# Patient Record
Sex: Male | Born: 1935 | Race: White | Hispanic: No | Marital: Married | Smoking: Never smoker
Health system: Southern US, Community
[De-identification: ages and names within clinical notes are randomized; demographics above are authoritative.]

## PROBLEM LIST (undated history)

## (undated) DIAGNOSIS — R011 Cardiac murmur, unspecified: Secondary | ICD-10-CM

## (undated) DIAGNOSIS — N529 Male erectile dysfunction, unspecified: Secondary | ICD-10-CM

## (undated) DIAGNOSIS — R32 Unspecified urinary incontinence: Secondary | ICD-10-CM

## (undated) DIAGNOSIS — Z87448 Personal history of other diseases of urinary system: Secondary | ICD-10-CM

## (undated) DIAGNOSIS — Z973 Presence of spectacles and contact lenses: Secondary | ICD-10-CM

## (undated) DIAGNOSIS — M1990 Unspecified osteoarthritis, unspecified site: Secondary | ICD-10-CM

## (undated) DIAGNOSIS — Z87442 Personal history of urinary calculi: Secondary | ICD-10-CM

## (undated) DIAGNOSIS — M25512 Pain in left shoulder: Secondary | ICD-10-CM

## (undated) DIAGNOSIS — Z8679 Personal history of other diseases of the circulatory system: Secondary | ICD-10-CM

## (undated) DIAGNOSIS — I10 Essential (primary) hypertension: Secondary | ICD-10-CM

## (undated) DIAGNOSIS — I4510 Unspecified right bundle-branch block: Secondary | ICD-10-CM

## (undated) DIAGNOSIS — C61 Malignant neoplasm of prostate: Secondary | ICD-10-CM

## (undated) DIAGNOSIS — E039 Hypothyroidism, unspecified: Secondary | ICD-10-CM

## (undated) DIAGNOSIS — J45909 Unspecified asthma, uncomplicated: Secondary | ICD-10-CM

## (undated) DIAGNOSIS — G8929 Other chronic pain: Secondary | ICD-10-CM

## (undated) DIAGNOSIS — N1330 Unspecified hydronephrosis: Secondary | ICD-10-CM

## (undated) DIAGNOSIS — J309 Allergic rhinitis, unspecified: Secondary | ICD-10-CM

## (undated) HISTORY — PX: TONSILLECTOMY: SUR1361

## (undated) HISTORY — PX: FRACTURE SURGERY: SHX138

## (undated) HISTORY — PX: EXTRACORPOREAL SHOCK WAVE LITHOTRIPSY: SHX1557

## (undated) HISTORY — PX: OTHER SURGICAL HISTORY: SHX169

---

## 1995-09-26 HISTORY — PX: OTHER SURGICAL HISTORY: SHX169

## 1998-03-22 ENCOUNTER — Ambulatory Visit (HOSPITAL_COMMUNITY): Admission: RE | Admit: 1998-03-22 | Discharge: 1998-03-22 | Payer: Self-pay | Admitting: Urology

## 1998-07-19 ENCOUNTER — Encounter: Admission: RE | Admit: 1998-07-19 | Discharge: 1998-10-17 | Payer: Self-pay | Admitting: Family Medicine

## 1998-11-05 ENCOUNTER — Encounter: Payer: Self-pay | Admitting: Family Medicine

## 1998-11-05 ENCOUNTER — Ambulatory Visit (HOSPITAL_COMMUNITY): Admission: RE | Admit: 1998-11-05 | Discharge: 1998-11-05 | Payer: Self-pay | Admitting: Family Medicine

## 1998-12-03 ENCOUNTER — Encounter: Payer: Self-pay | Admitting: General Surgery

## 1998-12-03 ENCOUNTER — Ambulatory Visit (HOSPITAL_COMMUNITY): Admission: RE | Admit: 1998-12-03 | Discharge: 1998-12-03 | Payer: Self-pay | Admitting: General Surgery

## 1998-12-10 ENCOUNTER — Encounter: Payer: Self-pay | Admitting: General Surgery

## 1998-12-16 ENCOUNTER — Ambulatory Visit (HOSPITAL_COMMUNITY): Admission: RE | Admit: 1998-12-16 | Discharge: 1998-12-16 | Payer: Self-pay | Admitting: General Surgery

## 2001-08-07 ENCOUNTER — Ambulatory Visit (HOSPITAL_COMMUNITY): Admission: RE | Admit: 2001-08-07 | Discharge: 2001-08-07 | Payer: Self-pay | Admitting: Gastroenterology

## 2001-09-11 ENCOUNTER — Encounter: Admission: RE | Admit: 2001-09-11 | Discharge: 2001-09-11 | Payer: Self-pay | Admitting: Urology

## 2001-09-11 ENCOUNTER — Encounter: Payer: Self-pay | Admitting: Urology

## 2002-01-15 ENCOUNTER — Inpatient Hospital Stay (HOSPITAL_COMMUNITY): Admission: EM | Admit: 2002-01-15 | Discharge: 2002-01-16 | Payer: Self-pay | Admitting: Emergency Medicine

## 2002-01-15 ENCOUNTER — Encounter: Payer: Self-pay | Admitting: Orthopedic Surgery

## 2002-01-15 ENCOUNTER — Encounter: Payer: Self-pay | Admitting: Emergency Medicine

## 2002-01-15 HISTORY — PX: OTHER SURGICAL HISTORY: SHX169

## 2003-09-03 ENCOUNTER — Ambulatory Visit (HOSPITAL_COMMUNITY): Admission: RE | Admit: 2003-09-03 | Discharge: 2003-09-03 | Payer: Self-pay | Admitting: Urology

## 2004-12-14 ENCOUNTER — Encounter: Admission: RE | Admit: 2004-12-14 | Discharge: 2004-12-14 | Payer: Self-pay | Admitting: Urology

## 2008-05-07 ENCOUNTER — Ambulatory Visit (HOSPITAL_COMMUNITY): Admission: RE | Admit: 2008-05-07 | Discharge: 2008-05-07 | Payer: Self-pay | Admitting: Urology

## 2009-04-29 ENCOUNTER — Encounter: Admission: RE | Admit: 2009-04-29 | Discharge: 2009-04-29 | Payer: Self-pay | Admitting: Family Medicine

## 2009-05-17 ENCOUNTER — Ambulatory Visit (HOSPITAL_COMMUNITY): Admission: RE | Admit: 2009-05-17 | Discharge: 2009-05-17 | Payer: Self-pay | Admitting: Urology

## 2009-05-21 ENCOUNTER — Ambulatory Visit (HOSPITAL_COMMUNITY): Admission: RE | Admit: 2009-05-21 | Discharge: 2009-05-21 | Payer: Self-pay | Admitting: Urology

## 2009-06-01 ENCOUNTER — Encounter: Admission: RE | Admit: 2009-06-01 | Discharge: 2009-06-01 | Payer: Self-pay | Admitting: Urology

## 2009-06-29 ENCOUNTER — Inpatient Hospital Stay (HOSPITAL_COMMUNITY): Admission: RE | Admit: 2009-06-29 | Discharge: 2009-07-02 | Payer: Self-pay | Admitting: Orthopaedic Surgery

## 2009-06-29 HISTORY — PX: TOTAL HIP ARTHROPLASTY: SHX124

## 2010-05-25 ENCOUNTER — Ambulatory Visit (HOSPITAL_COMMUNITY): Admission: RE | Admit: 2010-05-25 | Discharge: 2010-05-25 | Payer: Self-pay | Admitting: Urology

## 2010-10-17 ENCOUNTER — Encounter: Payer: Self-pay | Admitting: Urology

## 2010-12-29 LAB — POCT I-STAT 4, (NA,K, GLUC, HGB,HCT)
Glucose, Bld: 128 mg/dL — ABNORMAL HIGH (ref 70–99)
HCT: 33 % — ABNORMAL LOW (ref 39.0–52.0)

## 2010-12-29 LAB — CBC
HCT: 38.2 % — ABNORMAL LOW (ref 39.0–52.0)
Hemoglobin: 13.1 g/dL (ref 13.0–17.0)
Hemoglobin: 8.6 g/dL — ABNORMAL LOW (ref 13.0–17.0)
MCV: 96.4 fL (ref 78.0–100.0)
MCV: 96.5 fL (ref 78.0–100.0)
Platelets: 166 10*3/uL (ref 150–400)
RBC: 2.58 MIL/uL — ABNORMAL LOW (ref 4.22–5.81)
RBC: 2.59 MIL/uL — ABNORMAL LOW (ref 4.22–5.81)
RDW: 13.5 % (ref 11.5–15.5)
RDW: 13.7 % (ref 11.5–15.5)
WBC: 6.3 10*3/uL (ref 4.0–10.5)
WBC: 6.5 10*3/uL (ref 4.0–10.5)
WBC: 6.6 10*3/uL (ref 4.0–10.5)

## 2010-12-29 LAB — BASIC METABOLIC PANEL
BUN: 20 mg/dL (ref 6–23)
Calcium: 8.9 mg/dL (ref 8.4–10.5)
Chloride: 101 mEq/L (ref 96–112)
Chloride: 106 mEq/L (ref 96–112)
Creatinine, Ser: 0.71 mg/dL (ref 0.4–1.5)
Creatinine, Ser: 1 mg/dL (ref 0.4–1.5)
GFR calc Af Amer: >60 mL/min (ref >60)
GFR calc Af Amer: >60 mL/min (ref >60)
GFR calc Af Amer: >60 mL/min (ref >60)
GFR calc non Af Amer: >60 mL/min (ref >60)
GFR calc non Af Amer: >60 mL/min (ref >60)
GFR calc non Af Amer: >60 mL/min (ref >60)
Glucose, Bld: 117 mg/dL — ABNORMAL HIGH (ref 70–99)
Potassium: 4.1 mEq/L (ref 3.5–5.1)
Potassium: 4.3 mEq/L (ref 3.5–5.1)
Potassium: 4.4 mEq/L (ref 3.5–5.1)
Sodium: 136 mEq/L (ref 135–145)
Sodium: 140 mEq/L (ref 135–145)

## 2010-12-29 LAB — PROTIME-INR
INR: 1.05 (ref 0.00–1.49)
INR: 2.18 — ABNORMAL HIGH (ref 0.00–1.49)
Prothrombin Time: 13.6 seconds (ref 11.6–15.2)
Prothrombin Time: 15 seconds (ref 11.6–15.2)
Prothrombin Time: 22.2 seconds — ABNORMAL HIGH (ref 11.6–15.2)

## 2011-02-10 NOTE — Procedures (Signed)
[facility]  Patient:    ROBERTS, BON Visit Number: [id number] MRN: [id number]          Service Type: END Location: ENDO Attending Physician:  Orland Mustard Dictated by:   Llana Aliment. Randa Evens, M.D. Proc. Date: 08/07/01 Admit Date:  08/07/2001   CC:         Stacie Acres. Cliffton Asters, M.D.   Procedure Report  PROCEDURE:  Colonoscopy.  MEDICATIONS:  Fentanyl 62.5 mcg, Versed 5 mg IV.  SCOPE:  Adult Olympus video colonoscope.  INDICATION:  Strong family history of colon cancer.  Brother is dying of metastatic colon cancer.  DESCRIPTION OF PROCEDURE:  The procedure had been explained and consent obtained.  With the patient in the left lateral decubitus position, the Olympus adult video colonoscope was inserted and advanced under direct visualization.  The prep was excellent.  We were able to reach the cecum without difficulty.  The ascending colon, hepatic flexure, transverse colon, splenic flexure, descending, and sigmoid colon were seen well upon removal. No polyps throughout the entire colon.  Moderate diverticular disease seen in the sigmoid colon.  The scope was withdrawn.  The patient tolerated the procedure well.  ASSESSMENT: 1. Moderate diverticulosis of the sigmoid colon. 2. No evidence of polyps in a gentleman with a strong family history of colon    cancer.  PLAN: 1. Will recommend repeating in five years due to his family history. 2. Will give information about diverticular disease. Dictated by:   Llana Aliment. Randa Evens, M.D. Attending Physician:  Orland Mustard DD:  08/07/01 TD:  08/07/01 Job: [id number]

## 2011-02-10 NOTE — Op Note (Signed)
[place]  Patient:    [name] [name], [name] [name] Visit Number: [id number] MRN: [id number]          Service Type: MED Location: 1800 [id number] [id number] Attending Physician:  Nelia Shi Dictated by:   Elana Alm Thurston Hole, M.D. Proc. Date: 01/15/02 Admit Date:  01/15/2002                             Operative Report  PREOPERATIVE DIAGNOSIS:  Left ankle fracture dislocation.  POSTOPERATIVE DIAGNOSIS:  Left ankle fracture dislocation.  PROCEDURE:  Open reduction and internal fixation of left ankle fracture dislocation.  SURGEON:  Elana Alm. Thurston Hole, M.D.  ASSISTANT:  Julien Girt, P.A.  ANESTHESIA:  General.  OPERATIVE TIME:  One hour.  COMPLICATIONS:  None.  DESCRIPTION OF PROCEDURE:  Mr. Kyser was brought to the operating room on January 15, 2002 and placed on the operative table in supine position.  After an adequate level of general anesthesia was obtained, his left foot and leg were prepped using sterile Betadine and draped using sterile technique.  The initial dislocation had been reduced in the emergency room but he had an obvious persistent fracture with instability of the ankle.  He received Ancef 1 g IV preoperatively for prophylaxis.  The left leg was prepped and draped sterilely and then the leg was exsanguinated and the calf tourniquet elevated to 300 mm.  Initially, through a 7- to 8-cm longitudinal distal fibular incision, initial exposure was made.  The underlying subcutaneous tissues were incised in line with the skin incision.  The distal fibular fracture was exposed longitudinally, the peroneal tendons and sural nerve carefully retracted posteriorly and protected.  Initially, the fracture was reduced in an anatomic position and then a 3.5-mm anterior-to-posterior lag screw was placed across the fibula fracture site, holding provisional reduction.  A six-hole one-third tubulbar plate was then placed on the posterolateral surface,  with the most distal screw hole drilled, measured, tapped and the appropriate-length 4.0-mm screw placed and the three most proximal screw holes drilled, measured, tapped and the appropriate-length 3.5-mm cortical screws placed.  After this was done, then a syndesmosis screw, 4.0 mm in diameter and 40 mm in length, was placed through the fibular plate across the syndesmosis and into the distal tibia, parallel to the joint line; this held the syndesmosis in a reduced and anatomic position.  After this was done, the hardware was found to be in excellent position; the syndesmosis and the mortise were found to be reduced as well in an anatomic position.  At this point, the wounds were irrigated, then closed using 2-0 Vicryl and skin staples.  Sterile dressings and a short leg splint were applied.  The tourniquet was released, the patient then awakened and taken to recovery room in stable condition.  Needle and sponge count was correct x2 at the end of the case. Dictated by:   Elana Alm Thurston Hole, M.D. Attending Physician:  Nelia Shi DD:  01/15/02 TD:  01/15/02 Job: [id number]

## 2011-02-23 ENCOUNTER — Ambulatory Visit (HOSPITAL_BASED_OUTPATIENT_CLINIC_OR_DEPARTMENT_OTHER)
Admission: RE | Admit: 2011-02-23 | Discharge: 2011-02-23 | Disposition: A | Discharge status: Home / Self Care | Payer: Medicare Other | Source: Ambulatory Visit | Attending: Urology | Admitting: Urology

## 2011-02-23 DIAGNOSIS — N201 Calculus of ureter: Secondary | ICD-10-CM | POA: Insufficient documentation

## 2011-02-23 DIAGNOSIS — R972 Elevated prostate specific antigen [PSA]: Secondary | ICD-10-CM | POA: Insufficient documentation

## 2011-02-23 DIAGNOSIS — Z01812 Encounter for preprocedural laboratory examination: Secondary | ICD-10-CM | POA: Insufficient documentation

## 2011-02-23 DIAGNOSIS — N2889 Other specified disorders of kidney and ureter: Secondary | ICD-10-CM | POA: Insufficient documentation

## 2011-02-23 DIAGNOSIS — N210 Calculus in bladder: Secondary | ICD-10-CM | POA: Insufficient documentation

## 2011-02-23 DIAGNOSIS — Z8546 Personal history of malignant neoplasm of prostate: Secondary | ICD-10-CM | POA: Insufficient documentation

## 2011-02-23 DIAGNOSIS — Z01810 Encounter for preprocedural cardiovascular examination: Secondary | ICD-10-CM | POA: Insufficient documentation

## 2011-02-23 HISTORY — PX: OTHER SURGICAL HISTORY: SHX169

## 2011-03-06 NOTE — Op Note (Signed)
  NAME:  OVAL, CAVAZOS NO.:  [id number]  MEDICAL RECORD NO.:  [id number]  LOCATION:                                 FACILITY:  PHYSICIAN:  Excell Seltzer. Annabell Howells, M.D.    DATE OF BIRTH:  02-26-1932  DATE OF PROCEDURE:  02/23/2011 DATE OF DISCHARGE:                              OPERATIVE REPORT   PATIENT OF:  Excell Seltzer. Annabell Howells, M.D.  PROCEDURE PERFORMED:  Cystoscopy, unroofing of right ureterocele and stone manipulation.  PREOPERATIVE DIAGNOSIS:  Bladder stone.  POSTOPERATIVE DIAGNOSIS:  Right ureterocele with ureteral stone.  SURGEON:  Excell Seltzer. Annabell Howells, M.D.  ANESTHESIA:  General.  SPECIMENS:  Stone.  DRAINS:  None.  COMPLICATIONS:  None.  INDICATIONS:  Mr. Louvier is a 77 year old white male with a history of prostate cancer treated with radiation therapy with a rising PSA, who was recently evaluated for occasional urgency and urge incontinence with prior history of stones.  He had a plain film in the office, which revealed what appeared to be a bladder stone and this was confirmed with ultrasound.  FINDINGS OF PROCEDURES:  He was given Cipro.  He was taken to the operating room.  He was fitted with PAS hose.  A general anesthetic was induced.  He was placed in lithotomy position.  His perineum and genitalia were prepped with Betadine solution and he was draped in the usual sterile fashion.  Cystoscopy was performed using a 22-French scope and 12- and 70-degree lenses.  Examination revealed a normal urethra. The external sphincter was intact.  The prostatic urethra short with blanched mucosa and rather fixed mobility related to his prior radiation.  Examination of bladder revealed mild-to-moderate trabeculation.  No tumors were seen.  The left ureteral orifice was unremarkable.  The right ureteral orifice had the stone crowning from the orifice and a mounted appearance consistent with ureterocele.  At this point, urethra was calibrated.  A 28-French and a  27-French resectoscopes were inserted.  This was fitted with an Wandra Scot handle with [employer] and a 12-degree lens, and the ureterocele was unroofed allowing removal of the stone with the tip of the [employer].  The stone was then removed through with the resectoscope sheath.  Reinspection of bladder revealed no significant bleeding.  The bladder was drained.  The patient was taken down from lithotomy position.  His anesthetic was reversed.  He was moved to the recovery room in stable condition.  There were no complications.     Excell Seltzer. Annabell Howells, M.D.     JJW/MEDQ  D:  02/23/2011  T:  02/23/2011  Job:  [id number]  cc:   Molly Maduro A. Nicholos Johns, M.D. Fax: [phone]  Lubertha Basque. Jerl Santos, M.D. Fax: [phone]  Electronically Signed by Bjorn Pippin M.D. on 03/06/2011 07:41:19 AM

## 2011-10-09 DIAGNOSIS — K5730 Diverticulosis of large intestine without perforation or abscess without bleeding: Secondary | ICD-10-CM | POA: Diagnosis not present

## 2011-10-09 DIAGNOSIS — K648 Other hemorrhoids: Secondary | ICD-10-CM | POA: Diagnosis not present

## 2011-10-09 DIAGNOSIS — Z1211 Encounter for screening for malignant neoplasm of colon: Secondary | ICD-10-CM | POA: Diagnosis not present

## 2011-10-09 DIAGNOSIS — Z800 Family history of malignant neoplasm of digestive organs: Secondary | ICD-10-CM | POA: Diagnosis not present

## 2011-10-30 DIAGNOSIS — C61 Malignant neoplasm of prostate: Secondary | ICD-10-CM | POA: Diagnosis not present

## 2011-11-06 DIAGNOSIS — C61 Malignant neoplasm of prostate: Secondary | ICD-10-CM | POA: Diagnosis not present

## 2011-11-06 DIAGNOSIS — N200 Calculus of kidney: Secondary | ICD-10-CM | POA: Diagnosis not present

## 2011-11-06 DIAGNOSIS — N3941 Urge incontinence: Secondary | ICD-10-CM | POA: Diagnosis not present

## 2011-12-14 DIAGNOSIS — M6281 Muscle weakness (generalized): Secondary | ICD-10-CM | POA: Diagnosis not present

## 2011-12-14 DIAGNOSIS — R279 Unspecified lack of coordination: Secondary | ICD-10-CM | POA: Diagnosis not present

## 2011-12-14 DIAGNOSIS — N3941 Urge incontinence: Secondary | ICD-10-CM | POA: Diagnosis not present

## 2011-12-14 DIAGNOSIS — N39498 Other specified urinary incontinence: Secondary | ICD-10-CM | POA: Diagnosis not present

## 2011-12-18 DIAGNOSIS — M6281 Muscle weakness (generalized): Secondary | ICD-10-CM | POA: Diagnosis not present

## 2011-12-18 DIAGNOSIS — N39498 Other specified urinary incontinence: Secondary | ICD-10-CM | POA: Diagnosis not present

## 2011-12-18 DIAGNOSIS — R3589 Other polyuria: Secondary | ICD-10-CM | POA: Diagnosis not present

## 2011-12-18 DIAGNOSIS — R358 Other polyuria: Secondary | ICD-10-CM | POA: Diagnosis not present

## 2011-12-18 DIAGNOSIS — R279 Unspecified lack of coordination: Secondary | ICD-10-CM | POA: Diagnosis not present

## 2011-12-19 DIAGNOSIS — H25019 Cortical age-related cataract, unspecified eye: Secondary | ICD-10-CM | POA: Diagnosis not present

## 2011-12-25 DIAGNOSIS — M6281 Muscle weakness (generalized): Secondary | ICD-10-CM | POA: Diagnosis not present

## 2011-12-25 DIAGNOSIS — N3941 Urge incontinence: Secondary | ICD-10-CM | POA: Diagnosis not present

## 2011-12-25 DIAGNOSIS — R279 Unspecified lack of coordination: Secondary | ICD-10-CM | POA: Diagnosis not present

## 2011-12-25 DIAGNOSIS — N39498 Other specified urinary incontinence: Secondary | ICD-10-CM | POA: Diagnosis not present

## 2012-01-03 DIAGNOSIS — M216X9 Other acquired deformities of unspecified foot: Secondary | ICD-10-CM | POA: Diagnosis not present

## 2012-01-03 DIAGNOSIS — M25519 Pain in unspecified shoulder: Secondary | ICD-10-CM | POA: Diagnosis not present

## 2012-01-03 DIAGNOSIS — Z23 Encounter for immunization: Secondary | ICD-10-CM | POA: Diagnosis not present

## 2012-01-03 DIAGNOSIS — J309 Allergic rhinitis, unspecified: Secondary | ICD-10-CM | POA: Diagnosis not present

## 2012-01-03 DIAGNOSIS — E039 Hypothyroidism, unspecified: Secondary | ICD-10-CM | POA: Diagnosis not present

## 2012-01-03 DIAGNOSIS — Z0000 Encounter for general adult medical examination without abnormal findings: Secondary | ICD-10-CM | POA: Diagnosis not present

## 2012-01-08 DIAGNOSIS — N39498 Other specified urinary incontinence: Secondary | ICD-10-CM | POA: Diagnosis not present

## 2012-01-08 DIAGNOSIS — R279 Unspecified lack of coordination: Secondary | ICD-10-CM | POA: Diagnosis not present

## 2012-01-08 DIAGNOSIS — M6281 Muscle weakness (generalized): Secondary | ICD-10-CM | POA: Diagnosis not present

## 2012-01-08 DIAGNOSIS — N3941 Urge incontinence: Secondary | ICD-10-CM | POA: Diagnosis not present

## 2012-01-22 ENCOUNTER — Ambulatory Visit: Payer: Medicare Other | Attending: Family Medicine | Admitting: Physical Therapy

## 2012-01-22 DIAGNOSIS — R269 Unspecified abnormalities of gait and mobility: Secondary | ICD-10-CM | POA: Insufficient documentation

## 2012-01-22 DIAGNOSIS — IMO0001 Reserved for inherently not codable concepts without codable children: Secondary | ICD-10-CM | POA: Diagnosis not present

## 2012-01-29 DIAGNOSIS — M6281 Muscle weakness (generalized): Secondary | ICD-10-CM | POA: Diagnosis not present

## 2012-01-29 DIAGNOSIS — N39498 Other specified urinary incontinence: Secondary | ICD-10-CM | POA: Diagnosis not present

## 2012-01-29 DIAGNOSIS — N3941 Urge incontinence: Secondary | ICD-10-CM | POA: Diagnosis not present

## 2012-01-29 DIAGNOSIS — R279 Unspecified lack of coordination: Secondary | ICD-10-CM | POA: Diagnosis not present

## 2012-01-30 ENCOUNTER — Ambulatory Visit: Payer: Medicare Other | Attending: Family Medicine | Admitting: Physical Therapy

## 2012-01-30 DIAGNOSIS — IMO0001 Reserved for inherently not codable concepts without codable children: Secondary | ICD-10-CM | POA: Diagnosis not present

## 2012-01-30 DIAGNOSIS — R269 Unspecified abnormalities of gait and mobility: Secondary | ICD-10-CM | POA: Insufficient documentation

## 2012-02-01 ENCOUNTER — Ambulatory Visit: Payer: Medicare Other | Admitting: Physical Therapy

## 2012-02-01 DIAGNOSIS — R269 Unspecified abnormalities of gait and mobility: Secondary | ICD-10-CM | POA: Diagnosis not present

## 2012-02-01 DIAGNOSIS — IMO0001 Reserved for inherently not codable concepts without codable children: Secondary | ICD-10-CM | POA: Diagnosis not present

## 2012-02-05 ENCOUNTER — Ambulatory Visit: Payer: Medicare Other | Admitting: Physical Therapy

## 2012-02-05 DIAGNOSIS — R269 Unspecified abnormalities of gait and mobility: Secondary | ICD-10-CM | POA: Diagnosis not present

## 2012-02-05 DIAGNOSIS — IMO0001 Reserved for inherently not codable concepts without codable children: Secondary | ICD-10-CM | POA: Diagnosis not present

## 2012-02-07 ENCOUNTER — Ambulatory Visit: Payer: Medicare Other | Admitting: Physical Therapy

## 2012-02-07 DIAGNOSIS — R269 Unspecified abnormalities of gait and mobility: Secondary | ICD-10-CM | POA: Diagnosis not present

## 2012-02-07 DIAGNOSIS — IMO0001 Reserved for inherently not codable concepts without codable children: Secondary | ICD-10-CM | POA: Diagnosis not present

## 2012-02-12 ENCOUNTER — Ambulatory Visit: Payer: Medicare Other | Admitting: Physical Therapy

## 2012-02-12 DIAGNOSIS — IMO0001 Reserved for inherently not codable concepts without codable children: Secondary | ICD-10-CM | POA: Diagnosis not present

## 2012-02-12 DIAGNOSIS — R269 Unspecified abnormalities of gait and mobility: Secondary | ICD-10-CM | POA: Diagnosis not present

## 2012-02-13 DIAGNOSIS — N3941 Urge incontinence: Secondary | ICD-10-CM | POA: Diagnosis not present

## 2012-02-13 DIAGNOSIS — R279 Unspecified lack of coordination: Secondary | ICD-10-CM | POA: Diagnosis not present

## 2012-02-13 DIAGNOSIS — M6281 Muscle weakness (generalized): Secondary | ICD-10-CM | POA: Diagnosis not present

## 2012-02-15 ENCOUNTER — Ambulatory Visit: Payer: Medicare Other | Admitting: Physical Therapy

## 2012-02-15 DIAGNOSIS — R269 Unspecified abnormalities of gait and mobility: Secondary | ICD-10-CM | POA: Diagnosis not present

## 2012-02-15 DIAGNOSIS — IMO0001 Reserved for inherently not codable concepts without codable children: Secondary | ICD-10-CM | POA: Diagnosis not present

## 2012-02-20 ENCOUNTER — Ambulatory Visit: Payer: Medicare Other | Admitting: Physical Therapy

## 2012-02-20 DIAGNOSIS — R269 Unspecified abnormalities of gait and mobility: Secondary | ICD-10-CM | POA: Diagnosis not present

## 2012-02-20 DIAGNOSIS — IMO0001 Reserved for inherently not codable concepts without codable children: Secondary | ICD-10-CM | POA: Diagnosis not present

## 2012-02-22 ENCOUNTER — Ambulatory Visit: Payer: Medicare Other | Admitting: Physical Therapy

## 2012-02-22 DIAGNOSIS — R269 Unspecified abnormalities of gait and mobility: Secondary | ICD-10-CM | POA: Diagnosis not present

## 2012-02-22 DIAGNOSIS — IMO0001 Reserved for inherently not codable concepts without codable children: Secondary | ICD-10-CM | POA: Diagnosis not present

## 2012-05-07 DIAGNOSIS — C61 Malignant neoplasm of prostate: Secondary | ICD-10-CM | POA: Diagnosis not present

## 2012-06-19 DIAGNOSIS — Z23 Encounter for immunization: Secondary | ICD-10-CM | POA: Diagnosis not present

## 2012-08-12 DIAGNOSIS — N3941 Urge incontinence: Secondary | ICD-10-CM | POA: Diagnosis not present

## 2012-08-12 DIAGNOSIS — C61 Malignant neoplasm of prostate: Secondary | ICD-10-CM | POA: Diagnosis not present

## 2012-08-12 DIAGNOSIS — N200 Calculus of kidney: Secondary | ICD-10-CM | POA: Diagnosis not present

## 2012-08-12 DIAGNOSIS — N529 Male erectile dysfunction, unspecified: Secondary | ICD-10-CM | POA: Diagnosis not present

## 2012-11-18 DIAGNOSIS — M25559 Pain in unspecified hip: Secondary | ICD-10-CM | POA: Diagnosis not present

## 2012-11-18 DIAGNOSIS — M19019 Primary osteoarthritis, unspecified shoulder: Secondary | ICD-10-CM | POA: Diagnosis not present

## 2013-01-07 DIAGNOSIS — E039 Hypothyroidism, unspecified: Secondary | ICD-10-CM | POA: Diagnosis not present

## 2013-01-07 DIAGNOSIS — M216X9 Other acquired deformities of unspecified foot: Secondary | ICD-10-CM | POA: Diagnosis not present

## 2013-01-07 DIAGNOSIS — M25519 Pain in unspecified shoulder: Secondary | ICD-10-CM | POA: Diagnosis not present

## 2013-01-07 DIAGNOSIS — J309 Allergic rhinitis, unspecified: Secondary | ICD-10-CM | POA: Diagnosis not present

## 2013-01-07 DIAGNOSIS — Z0000 Encounter for general adult medical examination without abnormal findings: Secondary | ICD-10-CM | POA: Diagnosis not present

## 2013-02-18 DIAGNOSIS — C61 Malignant neoplasm of prostate: Secondary | ICD-10-CM | POA: Diagnosis not present

## 2013-02-24 DIAGNOSIS — C61 Malignant neoplasm of prostate: Secondary | ICD-10-CM | POA: Diagnosis not present

## 2013-02-24 DIAGNOSIS — N39498 Other specified urinary incontinence: Secondary | ICD-10-CM | POA: Diagnosis not present

## 2013-02-24 DIAGNOSIS — N200 Calculus of kidney: Secondary | ICD-10-CM | POA: Diagnosis not present

## 2013-02-24 DIAGNOSIS — R972 Elevated prostate specific antigen [PSA]: Secondary | ICD-10-CM | POA: Diagnosis not present

## 2013-03-11 DIAGNOSIS — R279 Unspecified lack of coordination: Secondary | ICD-10-CM | POA: Diagnosis not present

## 2013-03-11 DIAGNOSIS — C61 Malignant neoplasm of prostate: Secondary | ICD-10-CM | POA: Diagnosis not present

## 2013-03-11 DIAGNOSIS — N39498 Other specified urinary incontinence: Secondary | ICD-10-CM | POA: Diagnosis not present

## 2013-03-11 DIAGNOSIS — M6281 Muscle weakness (generalized): Secondary | ICD-10-CM | POA: Diagnosis not present

## 2013-03-20 DIAGNOSIS — R279 Unspecified lack of coordination: Secondary | ICD-10-CM | POA: Diagnosis not present

## 2013-03-20 DIAGNOSIS — N39498 Other specified urinary incontinence: Secondary | ICD-10-CM | POA: Diagnosis not present

## 2013-03-20 DIAGNOSIS — N3941 Urge incontinence: Secondary | ICD-10-CM | POA: Diagnosis not present

## 2013-03-20 DIAGNOSIS — M6281 Muscle weakness (generalized): Secondary | ICD-10-CM | POA: Diagnosis not present

## 2013-03-26 DIAGNOSIS — R279 Unspecified lack of coordination: Secondary | ICD-10-CM | POA: Diagnosis not present

## 2013-03-26 DIAGNOSIS — N39498 Other specified urinary incontinence: Secondary | ICD-10-CM | POA: Diagnosis not present

## 2013-03-26 DIAGNOSIS — M6281 Muscle weakness (generalized): Secondary | ICD-10-CM | POA: Diagnosis not present

## 2013-03-26 DIAGNOSIS — N3941 Urge incontinence: Secondary | ICD-10-CM | POA: Diagnosis not present

## 2013-04-02 DIAGNOSIS — R279 Unspecified lack of coordination: Secondary | ICD-10-CM | POA: Diagnosis not present

## 2013-04-02 DIAGNOSIS — M6281 Muscle weakness (generalized): Secondary | ICD-10-CM | POA: Diagnosis not present

## 2013-04-02 DIAGNOSIS — N3941 Urge incontinence: Secondary | ICD-10-CM | POA: Diagnosis not present

## 2013-04-02 DIAGNOSIS — N39498 Other specified urinary incontinence: Secondary | ICD-10-CM | POA: Diagnosis not present

## 2013-04-04 DIAGNOSIS — H6120 Impacted cerumen, unspecified ear: Secondary | ICD-10-CM | POA: Diagnosis not present

## 2013-04-09 DIAGNOSIS — N39498 Other specified urinary incontinence: Secondary | ICD-10-CM | POA: Diagnosis not present

## 2013-04-09 DIAGNOSIS — N3941 Urge incontinence: Secondary | ICD-10-CM | POA: Diagnosis not present

## 2013-04-09 DIAGNOSIS — M6281 Muscle weakness (generalized): Secondary | ICD-10-CM | POA: Diagnosis not present

## 2013-04-09 DIAGNOSIS — R279 Unspecified lack of coordination: Secondary | ICD-10-CM | POA: Diagnosis not present

## 2013-04-23 DIAGNOSIS — N3941 Urge incontinence: Secondary | ICD-10-CM | POA: Diagnosis not present

## 2013-04-23 DIAGNOSIS — N39498 Other specified urinary incontinence: Secondary | ICD-10-CM | POA: Diagnosis not present

## 2013-04-23 DIAGNOSIS — M6281 Muscle weakness (generalized): Secondary | ICD-10-CM | POA: Diagnosis not present

## 2013-04-23 DIAGNOSIS — R279 Unspecified lack of coordination: Secondary | ICD-10-CM | POA: Diagnosis not present

## 2013-05-21 DIAGNOSIS — C61 Malignant neoplasm of prostate: Secondary | ICD-10-CM | POA: Diagnosis not present

## 2013-05-28 DIAGNOSIS — N200 Calculus of kidney: Secondary | ICD-10-CM | POA: Diagnosis not present

## 2013-05-28 DIAGNOSIS — N3941 Urge incontinence: Secondary | ICD-10-CM | POA: Diagnosis not present

## 2013-05-28 DIAGNOSIS — N39498 Other specified urinary incontinence: Secondary | ICD-10-CM | POA: Diagnosis not present

## 2013-05-28 DIAGNOSIS — C61 Malignant neoplasm of prostate: Secondary | ICD-10-CM | POA: Diagnosis not present

## 2013-06-03 DIAGNOSIS — H269 Unspecified cataract: Secondary | ICD-10-CM | POA: Diagnosis not present

## 2013-06-03 DIAGNOSIS — H52229 Regular astigmatism, unspecified eye: Secondary | ICD-10-CM | POA: Diagnosis not present

## 2013-06-03 DIAGNOSIS — H5200 Hypermetropia, unspecified eye: Secondary | ICD-10-CM | POA: Diagnosis not present

## 2013-06-03 DIAGNOSIS — H524 Presbyopia: Secondary | ICD-10-CM | POA: Diagnosis not present

## 2013-06-11 DIAGNOSIS — N201 Calculus of ureter: Secondary | ICD-10-CM | POA: Diagnosis not present

## 2013-06-11 DIAGNOSIS — N200 Calculus of kidney: Secondary | ICD-10-CM | POA: Diagnosis not present

## 2013-06-17 DIAGNOSIS — N201 Calculus of ureter: Secondary | ICD-10-CM | POA: Diagnosis not present

## 2013-06-17 DIAGNOSIS — N200 Calculus of kidney: Secondary | ICD-10-CM | POA: Diagnosis not present

## 2013-06-17 DIAGNOSIS — N1330 Unspecified hydronephrosis: Secondary | ICD-10-CM | POA: Diagnosis not present

## 2013-06-19 ENCOUNTER — Other Ambulatory Visit: Payer: Self-pay | Admitting: Urology

## 2013-07-02 ENCOUNTER — Encounter (HOSPITAL_BASED_OUTPATIENT_CLINIC_OR_DEPARTMENT_OTHER): Payer: Self-pay | Admitting: *Deleted

## 2013-07-02 NOTE — Progress Notes (Signed)
07/02/13 1137  OBSTRUCTIVE SLEEP APNEA  Have you ever been diagnosed with sleep apnea through a sleep study? No  Do you snore loudly (loud enough to be heard through closed doors)?  1  Do you often feel tired, fatigued, or sleepy during the daytime? 1  Has anyone observed you stop breathing during your sleep? 0  Do you have, or are you being treated for high blood pressure? 0  BMI more than 35 kg/m2? 0  Age over 77 years old? 1  Neck circumference greater than 40 cm/18 inches? 0  Gender: 1  Obstructive Sleep Apnea Score 4  Score 4 or greater  Results sent to PCP

## 2013-07-02 NOTE — Progress Notes (Signed)
NPO AFTER MN. ARRIVE AT 0715. NEEDS HG. WILL TAKE SYNTHROID AM OF SURG W/ SIP OF WATER.

## 2013-07-07 DIAGNOSIS — Z23 Encounter for immunization: Secondary | ICD-10-CM | POA: Diagnosis not present

## 2013-07-07 NOTE — H&P (Signed)
ctive Problems Problems  1. Bladder Neck Contracture 596.0 2. Muscle Weakness 728.87 3. Nephrolithiasis Of Both Kidneys 592.0 4. 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 Polyuria 788.42 7. Poor Coordination 781.3 8. Prostate Cancer 185 9. PSA,Elevated 790.93 10. History of  Pyuria 791.9 11. Stress Incontinence 788.39 12. Urge Incontinence Of Urine 788.31  History of Present Illness  Annette Stable returns in f/u from his CT done for hematuria.  He was found to have small bilateral renal stones and an 8mm LUVJ stone with obstruction.   He is having no flank pain or gross hematuria but does have urgency and is on myrbetriq.   Past Medical History Problems  1. History of  Bladder Calculus V13.01 2. History of  Frequent, Small Amounts Of Urine 3. History of  Gross Hematuria 599.71 4. History of  Hypothyroidism 244.9 5. History of  Male Stress Incontinence 788.32 6. History of  Microscopic Hematuria 599.72 7. History of  Murmurs 785.2 8. History of  Nephrolithiasis V13.01 9. Polyuria 788.42 10. History of  Prostate Cancer V10.46 11. Prostate Cancer 185 12. History of  Pyuria 791.9 13. History of  Skin Cancer V10.83 14. History of  Urinary Tract Infection V13.02  Surgical History Problems  1. History of  Biopsy Skin 2. History of  Cystoscopy With Removal Of Ureteral Calculus Right 3. History of  Cystoscopy, Ureteral Meatotomy, Treat Ureterocele Right 4. History of  Leg Repair 5. History of  Lithotripsy 6. History of  Prostate Place Interstitial Dev For Radiation Guide Multiple  Current Meds 1. Allegra 180 MG TABS; Therapy: (Recorded:18Mar2008) to 2. Mobic 7.5 MG Oral Tablet; Therapy: (Recorded:23Oct2008) to 3. Myrbetriq 50 MG Oral Tablet Extended Release 24 Hour; Take 1 tablet daily; Therapy: 23Jun2014  to (Evaluate:18Jun2015)  Requested for: 25Jun2014; Last Rx:23Jun2014 4. Nasonex 50 MCG/ACT Nasal Suspension; Therapy:  (Recorded:18Mar2008) to 5. Synthroid TABS; Therapy: (Recorded:18Mar2008) to  Allergies Medication  1. No Known Drug Allergies  Family History Problems  1. Family history of  Cancer 2. Paternal history of  Death In The Family Father Age 68 3. Maternal history of  Death In The Family Mother Age 78 4. Family history of  Diabetes Mellitus V18.0 5. Family history of  Family Health Status Number Of Children  Social History Problems  1. Activities Of Daily Living 2. Alcohol Use 6 a year 3. Caffeine Use 1 4. Exercise Habits Is very active with exercise at least 12 hours per week working on the average of 4 hours per week with walking. 5. Living Independently With Spouse 6. Marital History - Currently Married 7. Never A Smoker 8. Occupation: retired Forensic scientist 9. Self-reliant In Usual Daily Activities Denied  10. History of  Drug Use 11. Tobacco Use  Review of Systems  Genitourinary: no hematuria.  Gastrointestinal: no flank pain.  Cardiovascular: no chest pain.  Respiratory: no shortness of breath.    Physical Exam Constitutional: Well nourished and well developed . No acute distress.  Pulmonary: No respiratory distress and normal respiratory rhythm and effort.  Cardiovascular: Heart rate and rhythm are normal . No peripheral edema.    Results/Data Urine [Data Includes: Last 1 Day]   23Sep2014  COLOR YELLOW   APPEARANCE CLEAR   SPECIFIC GRAVITY 1.015   pH 6.0   GLUCOSE NEG mg/dL  BILIRUBIN NEG   KETONE NEG mg/dL  BLOOD SMALL   PROTEIN NEG mg/dL  UROBILINOGEN 0.2 mg/dL  NITRITE NEG   LEUKOCYTE ESTERASE TRACE  SQUAMOUS EPITHELIAL/HPF RARE   WBC 0-2 WBC/hpf  RBC 3-6 RBC/hpf  BACTERIA NONE SEEN   CRYSTALS NONE SEEN   CASTS NONE SEEN    The following images/tracing/specimen were independently visualized:  CT films and report reviewed.  The following clinical lab reports were reviewed:  UA reviewed. Selected Results  AU CT-STONE PROTOCOL 17Sep2014  12:00AM Bjorn Pippin   Test Name Result Flag Reference  ** RADIOLOGY REPORT BY [facility] **   CLINICAL DATA: Evaluate stone. History of prostate cancer and seed implants.  EXAM: CT ABDOMEN AND PELVIS WITHOUT CONTRAST (URINARY CALCULUS PROTOCOL)  TECHNIQUE: Multidetector CT imaging was performed through the abdomen and pelvis without intravenous contrast to include the urinary tract.  COMPARISON: Abdominal radiograph 05/28/2013  FINDINGS: Lung bases are clear. No evidence for free air.  Normal appearance of the visualized liver. Normal appearance of the gallbladder, visualized spleen, pancreas and adrenal glands.  There is moderate to severe left hydroureteronephrosis. There is an 8 mm stone in the distal left ureter. 3 mm stone in the left kidney lower pole. There are 2 exophytic dense structures in the left kidney. Hounsfield units are not suggestive for simple cysts. In addition, there is nodularity along the mid pole of the left kidney which is nonspecific. An 8 mm stone in the upper pole of the right kidney and at the least 2 additional stones in the lower pole of the right kidney. Exophytic low-density structure in the right kidney is suggestive for a cyst. There is mild dilatation of the right ureter without definite ureter stone. Evaluation of the pelvis is limited from the right hip replacement artifact. The urinary bladder is moderately distended and there appears to be an anterior diverticulum containing a small stone. Alternatively, this could be related to a urachal remnant. Patient has radiation seeds in the prostate.  Normal appearance of the appendix. No gross abnormality to the small bowel. No significant free fluid or lymphadenopathy.  Right hip replacement without complicating features. There is mild sclerosis in the left femoral head which is nonspecific but likely degenerative. Multilevel degenerative changes in the lower  lumbar spine.  IMPRESSION: Moderate to severe left hydroureteronephrosis. There is an 8 mm stone in the distal left ureter.  Bilateral renal calculi.  Moderate distention of the urinary bladder with an anterior diverticulum containing a stone. There may be a component of bladder outlet obstruction.  Indeterminate dense structures involving the left kidney. These could represent hemorrhagic or proteinaceous cysts but indeterminate on this non contrast examination. In addition, there is nodularity along the mid pole the left kidney which is uncertain. These renal findings could be more definitively evaluated with a renal MRI.   Electronically Signed  By: Richarda Overlie M.D.  On: 06/11/2013 09:12   Assessment Assessed  1. Hydronephrosis On The Left 591 2. Nephrolithiasis Of Both Kidneys 592.0 3. Ureteral Stone Left 592.1   He has a large obstructing left UVJ stone and small bilateral renal stones.   Plan Health Maintenance (V70.0)  1. UA With REFLEX  Done: 23Sep2014 03:37PM Ureteral Stone (592.1)  2. Follow-up Schedule Surgery Office  Follow-up  Requested for: 23Sep2014   I discussed the options and will set him up for left ureteroscopy with holmium and possible stent.  I reviewed the risks of bleeding, infection, ureteral injury, need for additional procedures, thrombotic events and anesthetic complications.  I will try to get him done in my absence.   Discussion/Summary    CC: Dr. Elias Else and  Dr. Ula Lingo.

## 2013-07-08 ENCOUNTER — Ambulatory Visit (HOSPITAL_BASED_OUTPATIENT_CLINIC_OR_DEPARTMENT_OTHER): Payer: Medicare Other | Admitting: Anesthesiology

## 2013-07-08 ENCOUNTER — Ambulatory Visit (HOSPITAL_COMMUNITY): Payer: Medicare Other

## 2013-07-08 ENCOUNTER — Encounter (HOSPITAL_BASED_OUTPATIENT_CLINIC_OR_DEPARTMENT_OTHER): Payer: Self-pay | Admitting: *Deleted

## 2013-07-08 ENCOUNTER — Encounter (HOSPITAL_BASED_OUTPATIENT_CLINIC_OR_DEPARTMENT_OTHER): Payer: Medicare Other | Admitting: Anesthesiology

## 2013-07-08 ENCOUNTER — Ambulatory Visit (HOSPITAL_BASED_OUTPATIENT_CLINIC_OR_DEPARTMENT_OTHER)
Admission: RE | Admit: 2013-07-08 | Discharge: 2013-07-08 | Disposition: A | Discharge status: Home / Self Care | Payer: Medicare Other | Source: Ambulatory Visit | Attending: Urology | Admitting: Urology

## 2013-07-08 ENCOUNTER — Encounter (HOSPITAL_BASED_OUTPATIENT_CLINIC_OR_DEPARTMENT_OTHER)
Admission: RE | Disposition: A | Discharge status: Home / Self Care | Payer: Self-pay | Source: Ambulatory Visit | Attending: Urology

## 2013-07-08 DIAGNOSIS — E039 Hypothyroidism, unspecified: Secondary | ICD-10-CM | POA: Diagnosis not present

## 2013-07-08 DIAGNOSIS — Z87440 Personal history of urinary (tract) infections: Secondary | ICD-10-CM | POA: Insufficient documentation

## 2013-07-08 DIAGNOSIS — N200 Calculus of kidney: Secondary | ICD-10-CM | POA: Insufficient documentation

## 2013-07-08 DIAGNOSIS — N320 Bladder-neck obstruction: Secondary | ICD-10-CM | POA: Diagnosis not present

## 2013-07-08 DIAGNOSIS — Z85828 Personal history of other malignant neoplasm of skin: Secondary | ICD-10-CM | POA: Diagnosis not present

## 2013-07-08 DIAGNOSIS — N3946 Mixed incontinence: Secondary | ICD-10-CM | POA: Insufficient documentation

## 2013-07-08 DIAGNOSIS — C61 Malignant neoplasm of prostate: Secondary | ICD-10-CM | POA: Insufficient documentation

## 2013-07-08 DIAGNOSIS — N529 Male erectile dysfunction, unspecified: Secondary | ICD-10-CM | POA: Insufficient documentation

## 2013-07-08 DIAGNOSIS — N201 Calculus of ureter: Secondary | ICD-10-CM | POA: Insufficient documentation

## 2013-07-08 DIAGNOSIS — N1330 Unspecified hydronephrosis: Secondary | ICD-10-CM | POA: Diagnosis not present

## 2013-07-08 HISTORY — DX: Unspecified urinary incontinence: R32

## 2013-07-08 HISTORY — DX: Other chronic pain: G89.29

## 2013-07-08 HISTORY — DX: Pain in left shoulder: M25.512

## 2013-07-08 HISTORY — DX: Presence of spectacles and contact lenses: Z97.3

## 2013-07-08 HISTORY — DX: Unspecified osteoarthritis, unspecified site: M19.90

## 2013-07-08 HISTORY — DX: Hypothyroidism, unspecified: E03.9

## 2013-07-08 HISTORY — PX: CYSTOSCOPY WITH URETEROSCOPY, STONE BASKETRY AND STENT PLACEMENT: SHX6378

## 2013-07-08 HISTORY — DX: Allergic rhinitis, unspecified: J30.9

## 2013-07-08 HISTORY — DX: Male erectile dysfunction, unspecified: N52.9

## 2013-07-08 HISTORY — PX: TRANSURETHRAL RESECTION OF BLADDER NECK: SHX6196

## 2013-07-08 HISTORY — DX: Personal history of other diseases of the circulatory system: Z86.79

## 2013-07-08 HISTORY — DX: Personal history of urinary calculi: Z87.442

## 2013-07-08 HISTORY — DX: Personal history of other diseases of urinary system: Z87.448

## 2013-07-08 LAB — POCT HEMOGLOBIN-HEMACUE: Hemoglobin: 12.6 g/dL — ABNORMAL LOW (ref 13.0–17.0)

## 2013-07-08 SURGERY — CYSTOSCOPY, WITH CALCULUS MANIPULATION OR REMOVAL
Anesthesia: General | Site: Ureter | Wound class: Clean Contaminated

## 2013-07-08 MED ORDER — BELLADONNA ALKALOIDS-OPIUM 16.2-60 MG RE SUPP
RECTAL | Status: DC | PRN
  Administered 2013-07-08: 1 via RECTAL

## 2013-07-08 MED ORDER — ACETAMINOPHEN 325 MG PO TABS
650.0000 mg | ORAL_TABLET | ORAL | Status: DC | PRN
  Filled 2013-07-08: qty 2

## 2013-07-08 MED ORDER — ONDANSETRON HCL 4 MG/2ML IJ SOLN
INTRAMUSCULAR | Status: DC | PRN
  Administered 2013-07-08: 4 mg via INTRAMUSCULAR

## 2013-07-08 MED ORDER — SODIUM CHLORIDE 0.9 % IJ SOLN
3.0000 mL | INTRAMUSCULAR | Status: DC | PRN
  Filled 2013-07-08: qty 3

## 2013-07-08 MED ORDER — LACTATED RINGERS IV SOLN
INTRAVENOUS | Status: DC | PRN
  Administered 2013-07-08 (×2): via INTRAVENOUS

## 2013-07-08 MED ORDER — SODIUM CHLORIDE 0.9 % IR SOLN
Status: DC | PRN
  Administered 2013-07-08: 15000 mL

## 2013-07-08 MED ORDER — STERILE WATER FOR IRRIGATION IR SOLN
Status: DC | PRN
  Administered 2013-07-08: 3000 mL

## 2013-07-08 MED ORDER — KETOROLAC TROMETHAMINE 30 MG/ML IJ SOLN
INTRAMUSCULAR | Status: DC | PRN
  Administered 2013-07-08: 30 mg via INTRAVENOUS

## 2013-07-08 MED ORDER — SODIUM CHLORIDE 0.9 % IV SOLN
250.0000 mL | INTRAVENOUS | Status: DC | PRN
  Filled 2013-07-08: qty 250

## 2013-07-08 MED ORDER — EPHEDRINE SULFATE 50 MG/ML IJ SOLN
INTRAMUSCULAR | Status: DC | PRN
  Administered 2013-07-08 (×2): 10 mg via INTRAVENOUS

## 2013-07-08 MED ORDER — CIPROFLOXACIN HCL 500 MG PO TABS: 500.0000 mg | ORAL_TABLET | Freq: Two times a day (BID) | ORAL | Status: DC

## 2013-07-08 MED ORDER — ONDANSETRON HCL 4 MG/2ML IJ SOLN
4.0000 mg | Freq: Four times a day (QID) | INTRAMUSCULAR | Status: DC | PRN
  Filled 2013-07-08: qty 2

## 2013-07-08 MED ORDER — DEXAMETHASONE SODIUM PHOSPHATE 4 MG/ML IJ SOLN
INTRAMUSCULAR | Status: DC | PRN
  Administered 2013-07-08: 4 mg via INTRAVENOUS

## 2013-07-08 MED ORDER — LACTATED RINGERS IV SOLN
INTRAVENOUS | Status: DC
  Administered 2013-07-08: 08:00:00 via INTRAVENOUS
  Filled 2013-07-08: qty 1000

## 2013-07-08 MED ORDER — HYDROCODONE-ACETAMINOPHEN 5-325 MG PO TABS: 1.0000 | ORAL_TABLET | Freq: Four times a day (QID) | ORAL | Status: DC | PRN

## 2013-07-08 MED ORDER — OXYCODONE HCL 5 MG PO TABS
5.0000 mg | ORAL_TABLET | ORAL | Status: DC | PRN
  Filled 2013-07-08: qty 2

## 2013-07-08 MED ORDER — LIDOCAINE HCL (CARDIAC) 20 MG/ML IV SOLN
INTRAVENOUS | Status: DC | PRN
  Administered 2013-07-08: 80 mg via INTRAVENOUS

## 2013-07-08 MED ORDER — SODIUM CHLORIDE 0.9 % IJ SOLN
3.0000 mL | Freq: Two times a day (BID) | INTRAMUSCULAR | Status: DC
  Filled 2013-07-08: qty 3

## 2013-07-08 MED ORDER — FENTANYL CITRATE 0.05 MG/ML IJ SOLN
INTRAMUSCULAR | Status: DC | PRN
  Administered 2013-07-08 (×4): 25 ug via INTRAVENOUS

## 2013-07-08 MED ORDER — CIPROFLOXACIN IN D5W 400 MG/200ML IV SOLN
400.0000 mg | INTRAVENOUS | Status: AC
  Administered 2013-07-08: 400 mg via INTRAVENOUS
  Filled 2013-07-08: qty 200

## 2013-07-08 MED ORDER — FENTANYL CITRATE 0.05 MG/ML IJ SOLN
25.0000 ug | INTRAMUSCULAR | Status: DC | PRN
  Filled 2013-07-08: qty 1

## 2013-07-08 MED ORDER — ACETAMINOPHEN 650 MG RE SUPP
650.0000 mg | RECTAL | Status: DC | PRN
  Filled 2013-07-08: qty 1

## 2013-07-08 MED ORDER — PROPOFOL 10 MG/ML IV BOLUS
INTRAVENOUS | Status: DC | PRN
  Administered 2013-07-08: 20 mg via INTRAVENOUS
  Administered 2013-07-08: 180 mg via INTRAVENOUS

## 2013-07-08 SURGICAL SUPPLY — 37 items
BAG DRAIN URO-CYSTO SKYTR STRL (DRAIN) ×4 IMPLANT
BAG DRN UROCATH (DRAIN) ×3
BAG URINE DRAINAGE (UROLOGICAL SUPPLIES) ×2 IMPLANT
BASKET LASER NITINOL 1.9FR (BASKET) IMPLANT
BASKET STNLS GEMINI 4WIRE 3FR (BASKET) IMPLANT
BASKET ZERO TIP NITINOL 2.4FR (BASKET) IMPLANT
BRUSH URET BIOPSY 3F (UROLOGICAL SUPPLIES) IMPLANT
BSKT STON RTRVL 120 1.9FR (BASKET)
BSKT STON RTRVL GEM 120X11 3FR (BASKET)
BSKT STON RTRVL ZERO TP 2.4FR (BASKET)
CANISTER SUCT LVC 12 LTR MEDI- (MISCELLANEOUS) ×2 IMPLANT
CATH FOLEY 2WAY SLVR 30CC 20FR (CATHETERS) ×2 IMPLANT
CATH URET 5FR 28IN CONE TIP (BALLOONS)
CATH URET 5FR 28IN OPEN ENDED (CATHETERS) ×2 IMPLANT
CATH URET 5FR 70CM CONE TIP (BALLOONS) IMPLANT
CLOTH BEACON ORANGE TIMEOUT ST (SAFETY) ×4 IMPLANT
DRAPE CAMERA CLOSED 9X96 (DRAPES) ×4 IMPLANT
ELECT LOOP MED HF 24F 12D CBL (CLIP) ×2 IMPLANT
ELECT REM PT RETURN 9FT ADLT (ELECTROSURGICAL) ×4
ELECTRODE REM PT RTRN 9FT ADLT (ELECTROSURGICAL) ×1 IMPLANT
GLOVE SURG SS PI 8.0 STRL IVOR (GLOVE) ×4 IMPLANT
GOWN STRL NON-REIN LRG LVL3 (GOWN DISPOSABLE) ×4 IMPLANT
GOWN STRL REIN XL XLG (GOWN DISPOSABLE) ×4 IMPLANT
GUIDEWIRE 0.038 PTFE COATED (WIRE) IMPLANT
GUIDEWIRE ANG ZIPWIRE 038X150 (WIRE) IMPLANT
GUIDEWIRE STR DUAL SENSOR (WIRE) ×4 IMPLANT
IV NS IRRIG 3000ML ARTHROMATIC (IV SOLUTION) ×14 IMPLANT
KIT BALLIN UROMAX 15FX10 (LABEL) IMPLANT
KIT BALLN UROMAX 15FX4 (MISCELLANEOUS) IMPLANT
KIT BALLN UROMAX 26 75X4 (MISCELLANEOUS)
LASER FIBER DISP (UROLOGICAL SUPPLIES) IMPLANT
LASER FIBER DISP 1000U (UROLOGICAL SUPPLIES) IMPLANT
PACK CYSTOSCOPY (CUSTOM PROCEDURE TRAY) ×4 IMPLANT
SET HIGH PRES BAL DIL (LABEL)
SHEATH ACCESS URETERAL 38CM (SHEATH) IMPLANT
SHEATH ACCESS URETERAL 54CM (SHEATH) IMPLANT
WATER STERILE IRR 3000ML UROMA (IV SOLUTION) ×2 IMPLANT

## 2013-07-08 NOTE — Transfer of Care (Signed)
   Immediate Anesthesia Transfer of Care Note  Patient: [name] [name]  Procedure(s) Performed: Procedure(s) (LRB): LEFT  URETEROSCOPY, STONE EXTRACTION WITH LITHOTRIPSY AND POSSIBLE STENT PLACEMENT (Left) HOLMIUM LASER APPLICATION (N/A) TRANSURETHRAL RESECTION OF BLADDER NECK (N/A)  Patient Location: PACU  Anesthesia Type: General  Level of Consciousness: awake, sedated, patient cooperative and responds to stimulation  Airway & Oxygen Therapy: Patient Spontanous Breathing and Patient connected to face mask oxygen  Post-op Assessment: Report given to PACU RN, Post -op Vital signs reviewed and stable and Patient moving all extremities  Post vital signs: Reviewed and stable  Complications: No apparent anesthesia complications

## 2013-07-08 NOTE — Brief Op Note (Signed)
07/08/2013  10:01 AM  PATIENT:  [name]  79 y.o. male  PRE-OPERATIVE DIAGNOSIS:  LEFT DISTAL STONE   POST-OPERATIVE DIAGNOSIS:  LEFT DISTAL STONE and PROSTATE CANCER with LEFT URETERAL OBSTRUCTION  PROCEDURE:  Procedure(s) with comments: TRANSURETHRAL RESECTION OF BLADDER NECK (N/A) - resection of baldder neck  SURGEON:  Surgeon(s) and Role:    * Bjorn Pippin, MD - Primary  PHYSICIAN ASSISTANT:   ASSISTANTS: none   ANESTHESIA:   general  EBL:  Total I/O In: 1000 [I.V.:1000] Out: -   BLOOD ADMINISTERED:none  DRAINS: Urinary Catheter (Foley)   LOCAL MEDICATIONS USED:  NONE  SPECIMEN:  Source of Specimen:  bladder neck and prostate resection.   DISPOSITION OF SPECIMEN:  PATHOLOGY  COUNTS:  YES  TOURNIQUET:  * No tourniquets in log *  DICTATION: .Other Dictation: Dictation Number [id number]  PLAN OF CARE: Discharge to home after PACU  PATIENT DISPOSITION:  PACU - hemodynamically stable.   Delay start of Pharmacological VTE agent (>24hrs) due to surgical blood loss or risk of bleeding: not applicable

## 2013-07-08 NOTE — Anesthesia Postprocedure Evaluation (Signed)
  Anesthesia Post-op Note  Patient: [name] [name]  Procedure(s) Performed: Procedure(s) (LRB): LEFT  URETEROSCOPY, STONE EXTRACTION WITH LITHOTRIPSY AND POSSIBLE STENT PLACEMENT (Left) HOLMIUM LASER APPLICATION (N/A) TRANSURETHRAL RESECTION OF BLADDER NECK (N/A)  Patient Location: PACU  Anesthesia Type: General  Level of Consciousness: awake and alert   Airway and Oxygen Therapy: Patient Spontanous Breathing  Post-op Pain: mild  Post-op Assessment: Post-op Vital signs reviewed, Patient's Cardiovascular Status Stable, Respiratory Function Stable, Patent Airway and No signs of Nausea or vomiting  Last Vitals:  Filed Vitals:   07/08/13 1030  BP: 160/95  Pulse: 59  Temp:   Resp: 14    Post-op Vital Signs: stable   Complications: No apparent anesthesia complications

## 2013-07-08 NOTE — Anesthesia Procedure Notes (Signed)
Procedure Name: LMA Insertion Date/Time: 07/08/2013 8:54 AM Performed by: Jessica Priest Pre-anesthesia Checklist: Patient identified, Emergency Drugs available, Suction available and Patient being monitored Patient Re-evaluated:Patient Re-evaluated prior to inductionOxygen Delivery Method: Circle System Utilized Preoxygenation: Pre-oxygenation with 100% oxygen Intubation Type: IV induction Ventilation: Mask ventilation without difficulty LMA: LMA inserted LMA Size: 5.0 Number of attempts: 1 Airway Equipment and Method: bite block Placement Confirmation: positive ETCO2 Tube secured with: Tape Dental Injury: Teeth and Oropharynx as per pre-operative assessment

## 2013-07-08 NOTE — Anesthesia Preprocedure Evaluation (Signed)
Anesthesia Evaluation  Patient identified by MRN, date of birth, ID band Patient awake    Reviewed: Allergy & Precautions, H&P , NPO status , Patient's Chart, lab work & pertinent test results  Airway Mallampati: II TM Distance: >3 FB Neck ROM: Full    Dental no notable dental hx.    Pulmonary neg pulmonary ROS,  breath sounds clear to auscultation  Pulmonary exam normal       Cardiovascular Exercise Tolerance: Good + dysrhythmias Rhythm:Regular Rate:Normal  H/O iRBBB   Neuro/Psych PSYCHIATRIC DISORDERS negative neurological ROS     GI/Hepatic negative GI ROS, Neg liver ROS,   Endo/Other  Hypothyroidism   Renal/GU negative Renal ROS  negative genitourinary   Musculoskeletal negative musculoskeletal ROS (+)   Abdominal (+) + obese,   Peds negative pediatric ROS (+)  Hematology negative hematology ROS (+)   Anesthesia Other Findings   Reproductive/Obstetrics negative OB ROS                           Anesthesia Physical Anesthesia Plan  ASA: II  Anesthesia Plan: General   Post-op Pain Management:    Induction: Intravenous  Airway Management Planned: LMA  Additional Equipment:   Intra-op Plan:   Post-operative Plan: Extubation in OR  Informed Consent: I have reviewed the patients History and Physical, chart, labs and discussed the procedure including the risks, benefits and alternatives for the proposed anesthesia with the patient or authorized representative who has indicated his/her understanding and acceptance.   Dental advisory given  Plan Discussed with: CRNA  Anesthesia Plan Comments:         Anesthesia Quick Evaluation

## 2013-07-08 NOTE — Interval H&P Note (Signed)
History and Physical Interval Note:  07/08/2013 8:45 AM  [name]  has presented today for surgery, with the diagnosis of LEFT DISTAL STONE   The various methods of treatment have been discussed with the patient and family. After consideration of risks, benefits and other options for treatment, the patient has consented to  Procedure(s): LEFT  URETEROSCOPY, STONE EXTRACTION WITH LITHOTRIPSY AND POSSIBLE STENT PLACEMENT (Left) HOLMIUM LASER APPLICATION (N/A) as a surgical intervention .  The patient's history has been reviewed, patient examined, no change in status, stable for surgery.  I have reviewed the patient's chart and labs.  Questions were answered to the patient's satisfaction.     [name] [name]

## 2013-07-09 NOTE — Op Note (Signed)
NAME:  [name], [name] NO.:  [id number]  MEDICAL RECORD NO.:  [id number]  LOCATION:                               FACILITY:  [facility]  PHYSICIAN:  Excell Seltzer. Annabell Howells, M.D.    DATE OF BIRTH:  09/30/1931  DATE OF PROCEDURE:  07/08/2013 DATE OF DISCHARGE:  07/08/2013                              OPERATIVE REPORT   PROCEDURE:  Cystoscopy with transurethral resection of bladder neck.  PREOPERATIVE DIAGNOSIS:  Left distal ureteral stone with obstruction.  POSTOPERATIVE DIAGNOSIS:  Left distal ureteral stones with recurrent prostate cancer and left distal ureteral stone obstruction.  SURGEON:  Excell Seltzer. Annabell Howells, M.D.  ANESTHESIA:  General.  BLOOD LOSS:  Minimal.  SPECIMEN:  Prostate and bladder neck chips which were sent to pathology and to depleted iodine seeds which were sent to radiation oncology.  COMPLICATIONS:  None.  INDICATIONS:  [name] [name] is a 79 year old white male who was recently found to have a large left distal ureteral stone with obstruction.  He also has a history of prostate cancer with a prior seed implant and an elevated PSA for which he has declined secondary therapy.  It was felt that the attempted ureteroscopic stone extraction was indicated.  FINDINGS AND PROCEDURES:  He was taken to the operating room where general anesthetic was induced.  He was given Cipro and fitted with PAS hose and placed in lithotomy position.  A B and O suppository was inserted.  His perineum and genitalia were prepped with Betadine solution and draped in usual sterile fashion.  Cystoscopy was performed using a 22-French scope with 12 and 70 degree lens.  Examination revealed a normal urethra.  The external sphincter was intact.  The prostatic urethra had evidence of necrosis with no significant radiation changes and some associated dystrophic calcification.  The prostatic urethra was quite friable and bled easily with passage of the scope.  I switched from saline for  irrigant to water and then passed the Bugbee electrode and fulgurated bleeders at the bladder neck from the insertion of the scope.  Once this was done, further inspection with the 12 and 70-degree lenses revealed the right ureteral orifice adjacent to the bladder neck on the left.  However, there was what appeared to be infiltration of recurrent prostate cancer into the trigone and despite thorough inspection with both angle lenses, I was unable to identify the ureteral orifice.  The remainder of the bladder was remarkable for moderate to severe trabeculation with cellules but no mucosal lesions.  Once thorough cystoscopy was performed, I felt it was worthwhile to attempt resection of the left trigone to identify the ureteral orifice. Fluoroscopy was used intermittently to identify the large distal stone and its association to the trigone.  The urethra was calibrated to 30-French with Sissy Hoff sounds and a 28- French continuous flow resectoscope sheath was inserted.  The irrigant was switched back to saline and a 12-degree lens with an Florence-Graham handle and a gyrus loop was then passed.  Initially fulguration was performed at the bladder neck to improve visualization and then I began to resect the left bladder neck extending into the trigone.  Some additional resection on the right  bladder neck was also required for hemostasis and 8 angled ablation of the scope.  During this process, multiple prostatic calculi were unroofed and flushed into the bladder and eventually retrieved additionally to that depleted iodine seeds were also exposed and retrieved.  Despite fairly significant resection in the area of the left trigone, I was never able to identify the ureteral lumen and I felt that further resection would be risky and unlikely to improve the situation, so at this point I evacuated the bladder free of the prostate and bladder neck chips along with residual prostatic stones.  Once  this had been done, final hemostasis was achieved, and the scope was removed.  Inspection demonstrated resection at the bladder neck and good hemostasis throughout the prostatic urethra with intact external sphincter.  A 20-French Foley catheter was inserted.  The balloon was filled with 30 mL of sterile fluid and the catheter was here hand irrigated with initially pink but eventual clear return.  The catheter was placed to straight drainage.  The patient was taken down from lithotomy position. His anesthetic was reversed.  He was moved to recovery room in stable condition.  He will likely need a left percutaneous approach to the percutaneous antegrade approach to the stone with an attempt to pass an antegrade stent into the bladder, and I will make arrangements for this in the future.  I will have him return to the office on Thursday for voiding trial.     Excell Seltzer. Annabell Howells, M.D.   ______________________________ Excell Seltzer. Annabell Howells, M.D.    JJW/MEDQ  D:  07/08/2013  T:  07/09/2013  Job:  [id number]

## 2013-07-09 NOTE — Op Note (Deleted)
NAME:  [name] [name]              ACCOUNT NO.:  [id number]  MEDICAL RECORD NO.:  [id number]  LOCATION:                               FACILITY:  [facility]  PHYSICIAN:  Caycee Wanat J. Anjeli Casad, M.D.    DATE OF BIRTH:  09/28/1931  DATE OF PROCEDURE:  07/08/2013 DATE OF DISCHARGE:  07/08/2013                              OPERATIVE REPORT   PROCEDURE:  Cystoscopy with transurethral resection of bladder neck.  PREOPERATIVE DIAGNOSIS:  Left distal ureteral stone with obstruction.  POSTOPERATIVE DIAGNOSIS:  Left distal ureteral stones with recurrent prostate cancer and left distal ureteral stone obstruction.  SURGEON:  Jhade Berko J. Asencion Loveday, M.D.  ANESTHESIA:  General.  BLOOD LOSS:  Minimal.  SPECIMEN:  Prostate and bladder neck chips which were sent to pathology and to depleted iodine seeds which were sent to radiation oncology.  COMPLICATIONS:  None.  INDICATIONS:  [name] [name] is a 79-year-old white male who was recently found to have a large left distal ureteral stone with obstruction.  He also has a history of prostate cancer with a prior seed implant and an elevated PSA for which he has declined secondary therapy.  It was felt that the attempted ureteroscopic stone extraction was indicated.  FINDINGS AND PROCEDURES:  He was taken to the operating room where general anesthetic was induced.  He was given Cipro and fitted with PAS hose and placed in lithotomy position.  A B and O suppository was inserted.  His perineum and genitalia were prepped with Betadine solution and draped in usual sterile fashion.  Cystoscopy was performed using a 22-French scope with 12 and 70 degree lens.  Examination revealed a normal urethra.  The external sphincter was intact.  The prostatic urethra had evidence of necrosis with no significant radiation changes and some associated dystrophic calcification.  The prostatic urethra was quite friable and bled easily with passage of the scope.  I switched from saline for  irrigant to water and then passed the Bugbee electrode and fulgurated bleeders at the bladder neck from the insertion of the scope.  Once this was done, further inspection with the 12 and 70-degree lenses revealed the right ureteral orifice adjacent to the bladder neck on the left.  However, there was what appeared to be infiltration of recurrent prostate cancer into the trigone and despite thorough inspection with both angle lenses, I was unable to identify the ureteral orifice.  The remainder of the bladder was remarkable for moderate to severe trabeculation with cellules but no mucosal lesions.  Once thorough cystoscopy was performed, I felt it was worthwhile to attempt resection of the left trigone to identify the ureteral orifice. Fluoroscopy was used intermittently to identify the large distal stone and its association to the trigone.  The urethra was calibrated to 30-French with van Buren sounds and a 28- French continuous flow resectoscope sheath was inserted.  The irrigant was switched back to saline and a 12-degree lens with an Iglesias handle and a gyrus loop was then passed.  Initially fulguration was performed at the bladder neck to improve visualization and then I began to resect the left bladder neck extending into the trigone.  Some additional resection on the right   bladder neck was also required for hemostasis and 8 angled ablation of the scope.  During this process, multiple prostatic calculi were unroofed and flushed into the bladder and eventually retrieved additionally to that depleted iodine seeds were also exposed and retrieved.  Despite fairly significant resection in the area of the left trigone, I was never able to identify the ureteral lumen and I felt that further resection would be risky and unlikely to improve the situation, so at this point I evacuated the bladder free of the prostate and bladder neck chips along with residual prostatic stones.  Once  this had been done, final hemostasis was achieved, and the scope was removed.  Inspection demonstrated resection at the bladder neck and good hemostasis throughout the prostatic urethra with intact external sphincter.  A 20-French Foley catheter was inserted.  The balloon was filled with 30 mL of sterile fluid and the catheter was here hand irrigated with initially pink but eventual clear return.  The catheter was placed to straight drainage.  The patient was taken down from lithotomy position. His anesthetic was reversed.  He was moved to recovery room in stable condition.  He will likely need a left percutaneous approach to the percutaneous antegrade approach to the stone with an attempt to pass an antegrade stent into the bladder, and I will make arrangements for this in the future.  I will have him return to the office on Thursday for voiding trial.     [name] [name] J. [name] [name], M.D.   ______________________________ [name] [name] J. [name] [name], M.D.    JJW/MEDQ  D:  07/08/2013  T:  07/09/2013  Job:  [id number] 

## 2013-07-10 ENCOUNTER — Encounter (HOSPITAL_BASED_OUTPATIENT_CLINIC_OR_DEPARTMENT_OTHER): Payer: Self-pay | Admitting: Urology

## 2013-07-10 DIAGNOSIS — N390 Urinary tract infection, site not specified: Secondary | ICD-10-CM | POA: Diagnosis not present

## 2013-07-10 DIAGNOSIS — N320 Bladder-neck obstruction: Secondary | ICD-10-CM | POA: Diagnosis not present

## 2013-07-14 ENCOUNTER — Other Ambulatory Visit: Payer: Self-pay | Admitting: Urology

## 2013-07-14 ENCOUNTER — Other Ambulatory Visit: Payer: Self-pay | Admitting: Radiology

## 2013-07-14 DIAGNOSIS — N1330 Unspecified hydronephrosis: Secondary | ICD-10-CM

## 2013-07-16 ENCOUNTER — Ambulatory Visit (HOSPITAL_COMMUNITY)
Admission: RE | Admit: 2013-07-16 | Discharge: 2013-07-16 | Disposition: A | Discharge status: Home / Self Care | Payer: Medicare Other | Source: Ambulatory Visit | Attending: Urology | Admitting: Urology

## 2013-07-16 ENCOUNTER — Other Ambulatory Visit: Payer: Self-pay | Admitting: Urology

## 2013-07-16 ENCOUNTER — Encounter (HOSPITAL_COMMUNITY): Payer: Self-pay

## 2013-07-16 ENCOUNTER — Encounter (HOSPITAL_COMMUNITY): Payer: Self-pay | Admitting: Pharmacy Technician

## 2013-07-16 DIAGNOSIS — Z96649 Presence of unspecified artificial hip joint: Secondary | ICD-10-CM | POA: Diagnosis not present

## 2013-07-16 DIAGNOSIS — E039 Hypothyroidism, unspecified: Secondary | ICD-10-CM | POA: Diagnosis not present

## 2013-07-16 DIAGNOSIS — N1330 Unspecified hydronephrosis: Secondary | ICD-10-CM

## 2013-07-16 DIAGNOSIS — Z79899 Other long term (current) drug therapy: Secondary | ICD-10-CM | POA: Diagnosis not present

## 2013-07-16 DIAGNOSIS — N201 Calculus of ureter: Secondary | ICD-10-CM | POA: Diagnosis not present

## 2013-07-16 DIAGNOSIS — C61 Malignant neoplasm of prostate: Secondary | ICD-10-CM | POA: Insufficient documentation

## 2013-07-16 HISTORY — PX: PERCUTANEOUS NEPHROSTOMY: SHX2208

## 2013-07-16 LAB — CBC WITH DIFFERENTIAL/PLATELET
Basophils Absolute: 0 10*3/uL (ref 0.0–0.1)
Eosinophils Relative: 4 % (ref 0–5)
HCT: 35 % — ABNORMAL LOW (ref 39.0–52.0)
Lymphocytes Relative: 28 % (ref 12–46)
Lymphs Abs: 1.6 10*3/uL (ref 0.7–4.0)
MCH: 32.4 pg (ref 26.0–34.0)
MCHC: 34.6 g/dL (ref 30.0–36.0)
MCV: 93.8 fL (ref 78.0–100.0)
Monocytes Absolute: 0.5 10*3/uL (ref 0.1–1.0)
Monocytes Relative: 8 % (ref 3–12)
Platelets: 182 10*3/uL (ref 150–400)
RBC: 3.73 MIL/uL — ABNORMAL LOW (ref 4.22–5.81)
WBC: 5.7 10*3/uL (ref 4.0–10.5)

## 2013-07-16 LAB — PROTIME-INR: Prothrombin Time: 13.3 seconds (ref 11.6–15.2)

## 2013-07-16 LAB — BASIC METABOLIC PANEL
BUN: 27 mg/dL — ABNORMAL HIGH (ref 6–23)
CO2: 24 mEq/L (ref 19–32)
Chloride: 102 mEq/L (ref 96–112)
Sodium: 134 mEq/L — ABNORMAL LOW (ref 135–145)

## 2013-07-16 LAB — APTT: aPTT: 32 seconds (ref 24–37)

## 2013-07-16 MED ORDER — LIDOCAINE-EPINEPHRINE (PF) 2 %-1:200000 IJ SOLN
INTRAMUSCULAR | Status: AC
  Filled 2013-07-16: qty 20

## 2013-07-16 MED ORDER — LIDOCAINE HCL 1 % IJ SOLN
INTRAMUSCULAR | Status: AC
  Filled 2013-07-16: qty 20

## 2013-07-16 MED ORDER — HYDROCODONE-ACETAMINOPHEN 5-325 MG PO TABS: 1.0000 | ORAL_TABLET | ORAL | Status: DC | PRN

## 2013-07-16 MED ORDER — FENTANYL CITRATE 0.05 MG/ML IJ SOLN
INTRAMUSCULAR | Status: AC
  Filled 2013-07-16: qty 6

## 2013-07-16 MED ORDER — FENTANYL CITRATE 0.05 MG/ML IJ SOLN
INTRAMUSCULAR | Status: AC | PRN
  Administered 2013-07-16 (×2): 50 ug via INTRAVENOUS

## 2013-07-16 MED ORDER — IOHEXOL 300 MG/ML  SOLN
50.0000 mL | Freq: Once | INTRAMUSCULAR | Status: AC | PRN
  Administered 2013-07-16: 5 mL via INTRAVENOUS

## 2013-07-16 MED ORDER — CIPROFLOXACIN IN D5W 400 MG/200ML IV SOLN
INTRAVENOUS | Status: AC
  Administered 2013-07-16: 400 mg via INTRAVENOUS
  Filled 2013-07-16: qty 200

## 2013-07-16 MED ORDER — CEFAZOLIN SODIUM-DEXTROSE 2-3 GM-% IV SOLR
INTRAVENOUS | Status: AC
  Filled 2013-07-16: qty 50

## 2013-07-16 MED ORDER — MIDAZOLAM HCL 2 MG/2ML IJ SOLN
INTRAMUSCULAR | Status: AC
  Filled 2013-07-16: qty 6

## 2013-07-16 MED ORDER — CIPROFLOXACIN IN D5W 400 MG/200ML IV SOLN
400.0000 mg | Freq: Once | INTRAVENOUS | Status: AC
  Administered 2013-07-16: 400 mg via INTRAVENOUS

## 2013-07-16 MED ORDER — SODIUM CHLORIDE 0.9 % IV SOLN
INTRAVENOUS | Status: DC
  Administered 2013-07-16: 12:00:00 via INTRAVENOUS

## 2013-07-16 MED ORDER — MIDAZOLAM HCL 2 MG/2ML IJ SOLN
INTRAMUSCULAR | Status: AC | PRN
  Administered 2013-07-16 (×2): 1 mg via INTRAVENOUS

## 2013-07-16 NOTE — Procedures (Signed)
Placement of left nephrostomy tube without complication.  10 French drain was placed.  Plan for antegrade ureteral stent placement next week.

## 2013-07-16 NOTE — H&P (Signed)
[name] [name] is an 79 y.o. male.   Chief Complaint: "I'm here to get a tube in my left kidney" HPI: Patient with history of prostate cancer obstructing left distal ureter as well as a left distal ureteral stone with hydronephrosis. He is s/p recent cystoscopy with resection of bladder neck and difficulty identifying left ureteral orifice was noted. He now presents for left percutaneous nephrostomy /possible ureteral stent placement.  Past Medical History  Diagnosis Date  . Left ureteral calculus   . History of prostate cancer     S/P RADIOACTIVE SEED IMPLANTS 1997  . Arthritis   . Hypothyroidism   . History of kidney stones   . Organic impotence   . History of urinary tract obstruction     BNC  . Chronic left shoulder pain     AND DECREASE ROM--  END-STAGE OA  . OA (osteoarthritis)   . Wears glasses   . Urinary incontinence   . History of cardiac murmur     ASYMPTOMATIC  . Allergic rhinitis   . Incomplete RBBB     Past Surgical History  Procedure Laterality Date  . Cysto/  unroofing right ureterocele and stone manipulation  02-23-2011  . Total hip arthroplasty Right 06-29-2009  . Orif left ankle fx  01-15-2002  . Radioactive prostate seed implants  1997  . Extracorporeal shock wave lithotripsy    . Cystoscopy with ureteroscopy, stone basketry and stent placement Left 07/08/2013    Procedure: LEFT  URETEROSCOPY,;  Surgeon: Bjorn Pippin, MD;  Location: [facility];  Service: Urology;  Laterality: Left;  . Transurethral resection of bladder neck N/A 07/08/2013    Procedure: TRANSURETHRAL RESECTION OF BLADDER NECK;  Surgeon: Bjorn Pippin, MD;  Location: [facility];  Service: Urology;  Laterality: N/A;  resection of baldder neck    History reviewed. No pertinent family history. Social History:  reports that he has never smoked. He has never used smokeless tobacco. He reports that he does not drink alcohol or use illicit drugs.  Allergies: No Known  Allergies  Current outpatient prescriptions:fluticasone (FLONASE) 50 MCG/ACT nasal spray, Place 2 sprays into the nose daily., Disp: , Rfl: ;  levothyroxine (SYNTHROID, LEVOTHROID) 150 MCG tablet, Take 150 mcg by mouth daily before breakfast., Disp: , Rfl: ;  meloxicam (MOBIC) 7.5 MG tablet, Take 7.5 mg by mouth 2 (two) times daily. , Disp: , Rfl: ;  mirabegron ER (MYRBETRIQ) 50 MG TB24 tablet, Take 50 mg by mouth daily., Disp: , Rfl:  Current facility-administered medications:0.9 %  sodium chloride infusion, , Intravenous, Continuous, Brayton El, PA-C, Last Rate: 20 mL/hr at 07/16/13 1156;  ciprofloxacin (CIPRO) IVPB 400 mg, 400 mg, Intravenous, Once, Brayton El, PA-C   Results for orders placed during the hospital encounter of 07/16/13 (from the past 48 hour(s))  APTT     Status: None   Collection Time    07/16/13 11:55 AM      Result Value Range   aPTT 32  24 - 37 seconds  BASIC METABOLIC PANEL     Status: Abnormal   Collection Time    07/16/13 11:55 AM      Result Value Range   Sodium 134 (*) 135 - 145 mEq/L   Potassium 4.2  3.5 - 5.1 mEq/L   Chloride 102  96 - 112 mEq/L   CO2 24  19 - 32 mEq/L   Glucose, Bld 105 (*) 70 - 99 mg/dL   BUN 27 (*) 6 - 23  mg/dL   Creatinine, Ser 0.98  0.50 - 1.35 mg/dL   Calcium 9.2  8.4 - 11.9 mg/dL   GFR calc non Af Amer 77 (*) >90 mL/min   GFR calc Af Amer 90 (*) >90 mL/min   Comment: (NOTE)     The eGFR has been calculated using the CKD EPI equation.     This calculation has not been validated in all clinical situations.     eGFR's persistently <90 mL/min signify possible Chronic Kidney     Disease.  CBC WITH DIFFERENTIAL     Status: Abnormal   Collection Time    07/16/13 11:55 AM      Result Value Range   WBC 5.7  4.0 - 10.5 K/uL   RBC 3.73 (*) 4.22 - 5.81 MIL/uL   Hemoglobin 12.1 (*) 13.0 - 17.0 g/dL   HCT 14.7 (*) 82.9 - 56.2 %   MCV 93.8  78.0 - 100.0 fL   MCH 32.4  26.0 - 34.0 pg   MCHC 34.6  30.0 - 36.0 g/dL   RDW 13.0  86.5 -  78.4 %   Platelets 182  150 - 400 K/uL   Neutrophils Relative % 60  43 - 77 %   Neutro Abs 3.4  1.7 - 7.7 K/uL   Lymphocytes Relative 28  12 - 46 %   Lymphs Abs 1.6  0.7 - 4.0 K/uL   Monocytes Relative 8  3 - 12 %   Monocytes Absolute 0.5  0.1 - 1.0 K/uL   Eosinophils Relative 4  0 - 5 %   Eosinophils Absolute 0.2  0.0 - 0.7 K/uL   Basophils Relative 0  0 - 1 %   Basophils Absolute 0.0  0.0 - 0.1 K/uL  PROTIME-INR     Status: None   Collection Time    07/16/13 11:55 AM      Result Value Range   Prothrombin Time 13.3  11.6 - 15.2 seconds   INR 1.03  0.00 - 1.49   No results found.  Review of Systems  Constitutional: Negative for fever and chills.  Respiratory: Negative for cough and shortness of breath.   Cardiovascular: Negative for chest pain.  Gastrointestinal: Negative for nausea, vomiting and abdominal pain.  Musculoskeletal: Negative for back pain.  Neurological: Negative for headaches.  Endo/Heme/Allergies: Does not bruise/bleed easily.    Blood pressure 140/72, pulse 72, temperature 99 F (37.2 C), temperature source Oral, resp. rate 18, SpO2 99.00%. Physical Exam  Constitutional: He is oriented to person, place, and time. He appears well-developed and well-nourished.  Cardiovascular: Normal rate and regular rhythm.   Respiratory: Effort normal and breath sounds normal.  GI: Soft. Bowel sounds are normal. There is no tenderness.  Musculoskeletal: Normal range of motion. He exhibits no edema.  Neurological: He is alert and oriented to person, place, and time.     Assessment/Plan Pt with hx of prostate cancer with obstruction of distal left ureter/ left ureteral stone/ left hydronephrosis and inability to adequately identify left ureteral orifice on recent cystoscopy. Plan is for left percutaneous nephrostomy tube /possible ureteral stent placement today. Details/risks of procedure d/w pt/wife with their understanding and consent.  [name],[name] 07/16/2013, 1:17  PM

## 2013-07-18 ENCOUNTER — Other Ambulatory Visit: Payer: Self-pay | Admitting: Radiology

## 2013-07-18 DIAGNOSIS — C61 Malignant neoplasm of prostate: Secondary | ICD-10-CM | POA: Diagnosis not present

## 2013-07-21 ENCOUNTER — Ambulatory Visit (HOSPITAL_COMMUNITY)
Admission: RE | Admit: 2013-07-21 | Discharge: 2013-07-21 | Disposition: A | Discharge status: Home / Self Care | Payer: Medicare Other | Source: Ambulatory Visit | Attending: Urology | Admitting: Urology

## 2013-07-21 ENCOUNTER — Other Ambulatory Visit: Payer: Self-pay | Admitting: Urology

## 2013-07-21 ENCOUNTER — Encounter (HOSPITAL_COMMUNITY): Payer: Self-pay

## 2013-07-21 DIAGNOSIS — N201 Calculus of ureter: Secondary | ICD-10-CM | POA: Insufficient documentation

## 2013-07-21 DIAGNOSIS — E039 Hypothyroidism, unspecified: Secondary | ICD-10-CM | POA: Insufficient documentation

## 2013-07-21 DIAGNOSIS — N1330 Unspecified hydronephrosis: Secondary | ICD-10-CM | POA: Diagnosis not present

## 2013-07-21 DIAGNOSIS — H2510 Age-related nuclear cataract, unspecified eye: Secondary | ICD-10-CM | POA: Diagnosis not present

## 2013-07-21 DIAGNOSIS — Z8546 Personal history of malignant neoplasm of prostate: Secondary | ICD-10-CM | POA: Insufficient documentation

## 2013-07-21 DIAGNOSIS — H18419 Arcus senilis, unspecified eye: Secondary | ICD-10-CM | POA: Diagnosis not present

## 2013-07-21 DIAGNOSIS — H02839 Dermatochalasis of unspecified eye, unspecified eyelid: Secondary | ICD-10-CM | POA: Diagnosis not present

## 2013-07-21 HISTORY — PX: URETERAL STENT PLACEMENT: SHX822

## 2013-07-21 LAB — CBC
HCT: 37.6 % — ABNORMAL LOW (ref 39.0–52.0)
Hemoglobin: 12.6 g/dL — ABNORMAL LOW (ref 13.0–17.0)
MCHC: 33.5 g/dL (ref 30.0–36.0)
Platelets: 176 10*3/uL (ref 150–400)
RBC: 3.96 MIL/uL — ABNORMAL LOW (ref 4.22–5.81)
WBC: 6.4 10*3/uL (ref 4.0–10.5)

## 2013-07-21 LAB — BASIC METABOLIC PANEL
Calcium: 9.5 mg/dL (ref 8.4–10.5)
Creatinine, Ser: 0.89 mg/dL (ref 0.50–1.35)
GFR calc Af Amer: >90 mL/min (ref >90)
GFR calc non Af Amer: 79 mL/min — ABNORMAL LOW (ref >90)
Glucose, Bld: 105 mg/dL — ABNORMAL HIGH (ref 70–99)
Potassium: 4.2 mEq/L (ref 3.5–5.1)
Sodium: 139 mEq/L (ref 135–145)

## 2013-07-21 MED ORDER — HYDROCODONE-ACETAMINOPHEN 5-325 MG PO TABS: 1.0000 | ORAL_TABLET | ORAL | Status: DC | PRN

## 2013-07-21 MED ORDER — CIPROFLOXACIN IN D5W 400 MG/200ML IV SOLN
INTRAVENOUS | Status: AC
  Administered 2013-07-21: 400 mg via INTRAVENOUS
  Filled 2013-07-21: qty 200

## 2013-07-21 MED ORDER — CIPROFLOXACIN IN D5W 400 MG/200ML IV SOLN
400.0000 mg | INTRAVENOUS | Status: AC
  Administered 2013-07-21: 400 mg via INTRAVENOUS

## 2013-07-21 MED ORDER — SODIUM CHLORIDE 0.9 % IV SOLN
INTRAVENOUS | Status: DC
  Administered 2013-07-21: 08:00:00 via INTRAVENOUS

## 2013-07-21 MED ORDER — FENTANYL CITRATE 0.05 MG/ML IJ SOLN
INTRAMUSCULAR | Status: AC
  Filled 2013-07-21: qty 6

## 2013-07-21 MED ORDER — LIDOCAINE HCL 1 % IJ SOLN
INTRAMUSCULAR | Status: AC
  Filled 2013-07-21: qty 20

## 2013-07-21 MED ORDER — MIDAZOLAM HCL 2 MG/2ML IJ SOLN
INTRAMUSCULAR | Status: AC
  Filled 2013-07-21: qty 6

## 2013-07-21 MED ORDER — IOHEXOL 300 MG/ML  SOLN
25.0000 mL | Freq: Once | INTRAMUSCULAR | Status: AC | PRN
  Administered 2013-07-21: 20 mL

## 2013-07-21 MED ORDER — FENTANYL CITRATE 0.05 MG/ML IJ SOLN
INTRAMUSCULAR | Status: AC | PRN
  Administered 2013-07-21 (×3): 50 ug via INTRAVENOUS

## 2013-07-21 MED ORDER — MIDAZOLAM HCL 2 MG/2ML IJ SOLN
INTRAMUSCULAR | Status: AC | PRN
  Administered 2013-07-21 (×3): 1 mg via INTRAVENOUS

## 2013-07-21 NOTE — H&P (Signed)
[name] [name] is an 79 y.o. male.   Chief Complaint:  HPI: Patient with history of prostate cancer obstructing left distal ureter as well as a left distal ureteral stone with hydronephrosis. He is s/p recent cystoscopy with resection of bladder neck and difficulty identifying left ureteral orifice was noted. A left percutaneous nephrostomy tube was placed on 07/16/13 and he presents today for left ureteral stent placement.    Past Medical History  Diagnosis Date  . Left ureteral calculus   . History of prostate cancer     S/P RADIOACTIVE SEED IMPLANTS 1997  . Arthritis   . Hypothyroidism   . History of kidney stones   . Organic impotence   . History of urinary tract obstruction     BNC  . Chronic left shoulder pain     AND DECREASE ROM--  END-STAGE OA  . OA (osteoarthritis)   . Wears glasses   . Urinary incontinence   . History of cardiac murmur     ASYMPTOMATIC  . Allergic rhinitis   . Incomplete RBBB     Past Surgical History  Procedure Laterality Date  . Cysto/  unroofing right ureterocele and stone manipulation  02-23-2011  . Total hip arthroplasty Right 06-29-2009  . Orif left ankle fx  01-15-2002  . Radioactive prostate seed implants  1997  . Extracorporeal shock wave lithotripsy    . Cystoscopy with ureteroscopy, stone basketry and stent placement Left 07/08/2013    Procedure: LEFT  URETEROSCOPY,;  Surgeon: Bjorn Pippin, MD;  Location: [facility];  Service: Urology;  Laterality: Left;  . Transurethral resection of bladder neck N/A 07/08/2013    Procedure: TRANSURETHRAL RESECTION OF BLADDER NECK;  Surgeon: Bjorn Pippin, MD;  Location: [facility];  Service: Urology;  Laterality: N/A;  resection of baldder neck    History reviewed. No pertinent family history. Social History:  reports that he has never smoked. He has never used smokeless tobacco. He reports that he does not drink alcohol or use illicit drugs.  Allergies: No Known  Allergies  Current outpatient prescriptions:fluticasone (FLONASE) 50 MCG/ACT nasal spray, Place 2 sprays into the nose daily., Disp: , Rfl: ;  levothyroxine (SYNTHROID, LEVOTHROID) 150 MCG tablet, Take 150 mcg by mouth daily before breakfast., Disp: , Rfl: ;  meloxicam (MOBIC) 7.5 MG tablet, Take 7.5 mg by mouth 2 (two) times daily. , Disp: , Rfl: ;  mirabegron ER (MYRBETRIQ) 50 MG TB24 tablet, Take 50 mg by mouth daily., Disp: , Rfl:  Current facility-administered medications:0.9 %  sodium chloride infusion, , Intravenous, Continuous, [name] Tinisha Etzkorn, PA-C, Last Rate: 20 mL/hr at 07/21/13 0756;  ciprofloxacin (CIPRO) IVPB 400 mg, 400 mg, Intravenous, On Call, D Jeananne Rama, PA-C   Results for orders placed during the hospital encounter of 07/21/13 (from the past 48 hour(s))  BASIC METABOLIC PANEL     Status: Abnormal   Collection Time    07/21/13  7:45 AM      Result Value Range   Sodium 139  135 - 145 mEq/L   Potassium 4.2  3.5 - 5.1 mEq/L   Chloride 103  96 - 112 mEq/L   CO2 28  19 - 32 mEq/L   Glucose, Bld 105 (*) 70 - 99 mg/dL   BUN 25 (*) 6 - 23 mg/dL   Creatinine, Ser 4.09  0.50 - 1.35 mg/dL   Calcium 9.5  8.4 - 81.1 mg/dL   GFR calc non Af Amer 79 (*) >90 mL/min  GFR calc Af Amer >90  >90 mL/min   Comment: (NOTE)     The eGFR has been calculated using the CKD EPI equation.     This calculation has not been validated in all clinical situations.     eGFR's persistently <90 mL/min signify possible Chronic Kidney     Disease.  CBC     Status: Abnormal   Collection Time    07/21/13  7:45 AM      Result Value Range   WBC 6.4  4.0 - 10.5 K/uL   RBC 3.96 (*) 4.22 - 5.81 MIL/uL   Hemoglobin 12.6 (*) 13.0 - 17.0 g/dL   HCT 95.2 (*) 84.1 - 32.4 %   MCV 94.9  78.0 - 100.0 fL   MCH 31.8  26.0 - 34.0 pg   MCHC 33.5  30.0 - 36.0 g/dL   RDW 40.1  02.7 - 25.3 %   Platelets 176  150 - 400 K/uL   No results found.  Review of Systems  Constitutional: Negative for fever and chills.   Respiratory: Negative for cough and shortness of breath.   Cardiovascular: Negative for chest pain.  Gastrointestinal: Negative for nausea, vomiting and abdominal pain.  Genitourinary: Negative for hematuria and flank pain.  Musculoskeletal: Negative for back pain.  Neurological: Negative for headaches.  Endo/Heme/Allergies: Does not bruise/bleed easily.    Blood pressure 139/85, pulse 69, temperature 97.6 F (36.4 C), temperature source Oral, resp. rate 16, SpO2 96.00%. Physical Exam  Constitutional: He is oriented to person, place, and time. He appears well-developed and well-nourished.  Cardiovascular: Normal rate and regular rhythm.   Respiratory: Effort normal and breath sounds normal.  GI: Soft. Bowel sounds are normal.  Genitourinary:  Intact left PCN draining amber urine  Musculoskeletal: Normal range of motion. He exhibits no edema.  Neurological: He is alert and oriented to person, place, and time.     Assessment/Plan Patient with history of prostate cancer obstructing left distal ureter as well as a left distal ureteral stone with hydronephrosis. He is s/p recent cystoscopy with resection of bladder neck and difficulty identifying left ureteral orifice was noted. A left percutaneous nephrostomy tube was placed on 07/16/13 and he presents today for left ureteral stent placement. Details/risks of procedure d/w pt /wife with their understanding and consent.    [name],[name] 07/21/2013, 8:33 AM

## 2013-07-21 NOTE — Procedures (Signed)
Nephrostogram demonstrated a distal left ureteral stone and obstruction at UVJ.  Successful placement of 24 cm, 8 French left ureteral stent.  The percutaneous nephrostomy was removed.  No immediate complication.

## 2013-08-14 DIAGNOSIS — C61 Malignant neoplasm of prostate: Secondary | ICD-10-CM | POA: Diagnosis not present

## 2013-08-18 DIAGNOSIS — C61 Malignant neoplasm of prostate: Secondary | ICD-10-CM | POA: Diagnosis not present

## 2013-08-18 DIAGNOSIS — N201 Calculus of ureter: Secondary | ICD-10-CM | POA: Diagnosis not present

## 2013-08-18 DIAGNOSIS — N390 Urinary tract infection, site not specified: Secondary | ICD-10-CM | POA: Diagnosis not present

## 2013-08-19 ENCOUNTER — Other Ambulatory Visit: Payer: Self-pay | Admitting: Urology

## 2013-09-08 DIAGNOSIS — H269 Unspecified cataract: Secondary | ICD-10-CM | POA: Diagnosis not present

## 2013-09-08 DIAGNOSIS — H2510 Age-related nuclear cataract, unspecified eye: Secondary | ICD-10-CM | POA: Diagnosis not present

## 2013-09-09 DIAGNOSIS — H2510 Age-related nuclear cataract, unspecified eye: Secondary | ICD-10-CM | POA: Diagnosis not present

## 2013-09-13 DIAGNOSIS — J209 Acute bronchitis, unspecified: Secondary | ICD-10-CM | POA: Diagnosis not present

## 2013-09-15 DIAGNOSIS — H2510 Age-related nuclear cataract, unspecified eye: Secondary | ICD-10-CM | POA: Diagnosis not present

## 2013-09-17 DIAGNOSIS — C61 Malignant neoplasm of prostate: Secondary | ICD-10-CM | POA: Diagnosis not present

## 2013-09-17 DIAGNOSIS — R82998 Other abnormal findings in urine: Secondary | ICD-10-CM | POA: Diagnosis not present

## 2013-09-17 DIAGNOSIS — N201 Calculus of ureter: Secondary | ICD-10-CM | POA: Diagnosis not present

## 2013-09-17 DIAGNOSIS — R972 Elevated prostate specific antigen [PSA]: Secondary | ICD-10-CM | POA: Diagnosis not present

## 2013-09-22 DIAGNOSIS — H269 Unspecified cataract: Secondary | ICD-10-CM | POA: Diagnosis not present

## 2013-09-22 DIAGNOSIS — H2510 Age-related nuclear cataract, unspecified eye: Secondary | ICD-10-CM | POA: Diagnosis not present

## 2013-09-22 HISTORY — PX: EYE SURGERY: SHX253

## 2013-09-24 ENCOUNTER — Encounter (HOSPITAL_BASED_OUTPATIENT_CLINIC_OR_DEPARTMENT_OTHER): Payer: Self-pay | Admitting: *Deleted

## 2013-09-24 NOTE — Progress Notes (Addendum)
To [facility] at 0615-Hg,Ekg  on arrival-Instructed Npo after Mn-will take levothyroxine,mirabegron w/sip water that am.

## 2013-09-29 NOTE — H&P (Signed)
ctive Problems Problems   1. Bilateral kidney stones (592.0)  2. 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 Calculus of ureter (592.1)  4. Elevated prostate specific antigen (PSA) (790.93)  5. Erectile dysfunction due to arterial insufficiency (607.84)  6. Hydronephrosis, left (591)  7. Muscle weakness (728.87)  8. Other lack of coordination (781.3)  9. Polyuria (788.42)  10. Prostate cancer (185)  11. Pyuria (791.9)  12. Routine history and physical examination of adult (V70.0)  13. Stress Incontinence (788.39)  14. Urge incontinence of urine (788.31)  15. Urinary tract infection (599.0)  History of Present Illness       [name] [name] returns today in f/u.  He has recurrent Gleason 9 prostate cancer initially treated with seeds who has had a rising PSA and developled left ureteral obstruction and a stone above the obstruction.  He is currently on firmagon and his PSA was down to 1.2 after the first injection from 13.2 prior to treatment.   He is voiding with reduced incontinence but he does have to wear a pad.  He has no hematuria or dysuria.  He has no real flank pain.  He has a left ureteral stent that was placed antegrade after I was unable to get one up from below.   He had a bladder neck resection at the time of the stent attempt.  He remains on Myrbetriq which helps.   He has no left flank pain.   He thinks [facility]      Past Medical History Problems   1. History of Bladder Calculus (V13.01)  2. History of Frequent, Small Amounts Of Urine  3. History of Gross hematuria (599.71)  4. History of hypothyroidism (V12.29)  5. History of kidney stones (V13.01)  6. History of Male stress incontinence (788.32)  7. History of Microscopic hematuria (599.72)  8. History of Murmur (785.2)  9. Personal history of prostate cancer (V10.46)  10. Polyuria (788.42)  11. Prostate cancer (185)  12. Pyuria (791.9)  13. History of Skin Cancer (V10.83)  14. History of Urinary Tract Infection (V13.02)  Surgical History Problems   1. History of Biopsy Skin  2. History of Cystoscopy With Removal Of Ureteral Calculus Right  3. History of Cystoscopy, Ureteral Meatotomy, Treat Ureterocele Right  4. History of Leg Repair  5. History of Lithotripsy  6. History of Prostate Place Interstitial Dev For Radiation Guide Multiple  7. History of Transurethral Resection Of Bladder Neck   He is to have cataract surgery on the left on 12/29 and then I will attempt ureteroscopy later in January.   Current Meds  1. Allegra 180 MG TABS;  Therapy: (Recorded:18Mar2008) to Recorded  2. Benzonatate 100 MG Oral Capsule;  Therapy: 20Dec2014 to Recorded  3. Besivance 0.6 % Ophthalmic Suspension;  Therapy: [id number] to Recorded  4. Durezol 0.05 % Ophthalmic Emulsion;  Therapy: [id number] to Recorded  5. Firmagon 120 MG Subcutaneous Solution Reconstituted; 120 mg  SQ rt and left  abdomen for total of 240 mg; To Be Done: 22Oct2014; Status: HOLD FOR -  Administration Ordered  6. Ilevro 0.3 % Ophthalmic Suspension;  Therapy: [id number] to Recorded  7. Mobic 7.5 MG Oral Tablet;  Therapy: (Recorded:23Oct2008) to Recorded  8. Myrbetriq 50 MG Oral Tablet Extended Release 24 Hour; Take 1 tablet daily;  Therapy: [phone] to (Evaluate:18Jun2015)  Requested for: [phone]; Last  HW:[id number] Ordered  9. Nasonex 50 MCG/ACT Nasal Suspension;  Therapy: (Recorded:18Mar2008) to Recorded  10. ProAir HFA 108 (90 Base) MCG/ACT Inhalation Aerosol Solution;   Therapy: 20Dec2014 to Recorded  11. Synthroid TABS;   Therapy: (Recorded:18Mar2008) to Recorded  Allergies Medication   1. No Known Drug Allergies  Family History Problems   1. Family history of Cancer  2. Family history of Death In The Family Father : Father   Age 43  3. Family history of Death In The Family Mother : Mother   Age 30  4. Family history of Diabetes Mellitus (V18.0)  5. Family history of Family  Health Status Number Of Children  Social History Problems   1. Activities Of Daily Living  2. Alcohol Use   6 a year  3. Caffeine Use   1  4. Denied: History of Drug Use  5. Exercise Habits   Is very active with exercise at least 12 hours per week working on the average of 4 hours     per week with walking.  6. Living Independently With Spouse  7. Marital History - Currently Married  8. Never A Smoker  9. Occupation:   retired Social research officer, government  28. Self-reliant In Usual Daily Activities  11. Denied: Tobacco Use  Review of Systems  Cardiovascular: no chest pain.  Respiratory: cough, but no shortness of breath.    Vitals Vital Signs [Data Includes: Last 1 Day]  Recorded: 24Dec2014 10:25AM  Blood Pressure: 116 / 68 Temperature: 98.6 F Heart Rate: 68  Physical Exam Constitutional: Well nourished and well developed . No acute distress.  Pulmonary: No respiratory distress and normal respiratory rhythm and effort.  Cardiovascular: Heart rate and rhythm are normal . No peripheral edema.    Results/Data Urine [Data Includes: Last 1 Day]   [id number]  COLOR YELLOW   APPEARANCE CLOUDY   SPECIFIC GRAVITY 1.025   pH 6.0   GLUCOSE NEG mg/dL  BILIRUBIN NEG   KETONE NEG mg/dL  BLOOD LARGE   PROTEIN 100 mg/dL  UROBILINOGEN 0.2 mg/dL  NITRITE POS   LEUKOCYTE ESTERASE MOD   SQUAMOUS EPITHELIAL/HPF RARE   WBC TNTC WBC/hpf  RBC TNTC RBC/hpf  BACTERIA MANY   CRYSTALS NONE SEEN   CASTS NONE SEEN    Procedure  Firmagon 80mg  SQ today.     Assessment Assessed   1. Calculus of ureter (592.1)  2. Elevated prostate specific antigen (PSA) (790.93)  3. Encounter for preventive health examination (V70.0)  4. Pyuria (791.9)   He is doing well on current therapy and is scheduled for ureteroscopy in January. His UA is abnormal today which is not unexpected.   Plan Health Maintenance   1. UA With REFLEX; [Do Not Release]; Status:Resulted - Requires Verification;   Done:   [id number] 09:25AM Prostate cancer   2. Administered: Firmagon 80 MG Subcutaneous Solution Reconstituted Pyuria   3. URINE CULTURE; Status:Hold For - Specimen/Data Collection,Appointment; Requested  for:24Dec2014;    Urine culture. We will proceed with ureteroscopy and stent removal as plannned.   Discussion/Summary  CC: Dr. Maury Dus and Dr. Braulio Bosch.

## 2013-09-30 ENCOUNTER — Other Ambulatory Visit: Payer: Self-pay

## 2013-09-30 ENCOUNTER — Encounter (HOSPITAL_BASED_OUTPATIENT_CLINIC_OR_DEPARTMENT_OTHER): Payer: Medicare Other | Admitting: Anesthesiology

## 2013-09-30 ENCOUNTER — Ambulatory Visit (HOSPITAL_BASED_OUTPATIENT_CLINIC_OR_DEPARTMENT_OTHER)
Admission: RE | Admit: 2013-09-30 | Discharge: 2013-09-30 | Disposition: A | Discharge status: Home / Self Care | Payer: Medicare Other | Source: Ambulatory Visit | Attending: Urology | Admitting: Urology

## 2013-09-30 ENCOUNTER — Encounter (HOSPITAL_BASED_OUTPATIENT_CLINIC_OR_DEPARTMENT_OTHER)
Admission: RE | Disposition: A | Discharge status: Home / Self Care | Payer: Self-pay | Source: Ambulatory Visit | Attending: Urology

## 2013-09-30 ENCOUNTER — Encounter (HOSPITAL_BASED_OUTPATIENT_CLINIC_OR_DEPARTMENT_OTHER): Payer: Self-pay | Admitting: *Deleted

## 2013-09-30 ENCOUNTER — Ambulatory Visit (HOSPITAL_BASED_OUTPATIENT_CLINIC_OR_DEPARTMENT_OTHER): Payer: Medicare Other | Admitting: Anesthesiology

## 2013-09-30 DIAGNOSIS — N135 Crossing vessel and stricture of ureter without hydronephrosis: Secondary | ICD-10-CM | POA: Diagnosis present

## 2013-09-30 DIAGNOSIS — N3946 Mixed incontinence: Secondary | ICD-10-CM | POA: Insufficient documentation

## 2013-09-30 DIAGNOSIS — Z79899 Other long term (current) drug therapy: Secondary | ICD-10-CM | POA: Insufficient documentation

## 2013-09-30 DIAGNOSIS — N200 Calculus of kidney: Secondary | ICD-10-CM | POA: Insufficient documentation

## 2013-09-30 DIAGNOSIS — N201 Calculus of ureter: Secondary | ICD-10-CM | POA: Diagnosis not present

## 2013-09-30 DIAGNOSIS — C61 Malignant neoplasm of prostate: Secondary | ICD-10-CM | POA: Diagnosis present

## 2013-09-30 DIAGNOSIS — E039 Hypothyroidism, unspecified: Secondary | ICD-10-CM | POA: Diagnosis not present

## 2013-09-30 DIAGNOSIS — N320 Bladder-neck obstruction: Secondary | ICD-10-CM | POA: Insufficient documentation

## 2013-09-30 DIAGNOSIS — Z85828 Personal history of other malignant neoplasm of skin: Secondary | ICD-10-CM | POA: Diagnosis not present

## 2013-09-30 HISTORY — PX: CYSTOSCOPY WITH URETEROSCOPY AND STENT PLACEMENT: SHX6377

## 2013-09-30 HISTORY — PX: HOLMIUM LASER APPLICATION: SHX5852

## 2013-09-30 SURGERY — CYSTOURETEROSCOPY, WITH STENT INSERTION
Anesthesia: General | Site: Ureter | Laterality: Left

## 2013-09-30 MED ORDER — ACETAMINOPHEN 325 MG PO TABS
650.0000 mg | ORAL_TABLET | ORAL | Status: DC | PRN
  Filled 2013-09-30: qty 2

## 2013-09-30 MED ORDER — CIPROFLOXACIN IN D5W 400 MG/200ML IV SOLN
400.0000 mg | INTRAVENOUS | Status: AC
  Administered 2013-09-30: 400 mg via INTRAVENOUS
  Filled 2013-09-30: qty 200

## 2013-09-30 MED ORDER — ACETAMINOPHEN 650 MG RE SUPP
650.0000 mg | RECTAL | Status: DC | PRN
  Filled 2013-09-30: qty 1

## 2013-09-30 MED ORDER — FENTANYL CITRATE 0.05 MG/ML IJ SOLN
INTRAMUSCULAR | Status: AC
  Filled 2013-09-30: qty 4

## 2013-09-30 MED ORDER — SODIUM CHLORIDE 0.9 % IV SOLN
250.0000 mL | INTRAVENOUS | Status: DC | PRN
  Filled 2013-09-30: qty 250

## 2013-09-30 MED ORDER — LACTATED RINGERS IV SOLN
INTRAVENOUS | Status: DC
  Administered 2013-09-30 (×2): via INTRAVENOUS
  Filled 2013-09-30: qty 1000

## 2013-09-30 MED ORDER — FENTANYL CITRATE 0.05 MG/ML IJ SOLN
25.0000 ug | INTRAMUSCULAR | Status: DC | PRN
  Filled 2013-09-30: qty 1

## 2013-09-30 MED ORDER — SODIUM CHLORIDE 0.9 % IJ SOLN
3.0000 mL | INTRAMUSCULAR | Status: DC | PRN
  Filled 2013-09-30: qty 3

## 2013-09-30 MED ORDER — KETOROLAC TROMETHAMINE 30 MG/ML IJ SOLN
INTRAMUSCULAR | Status: DC | PRN
  Administered 2013-09-30: 15 mg via INTRAVENOUS

## 2013-09-30 MED ORDER — BELLADONNA ALKALOIDS-OPIUM 16.2-60 MG RE SUPP
RECTAL | Status: DC | PRN
  Administered 2013-09-30: 1 via RECTAL

## 2013-09-30 MED ORDER — MIDAZOLAM HCL 2 MG/2ML IJ SOLN
INTRAMUSCULAR | Status: AC
  Filled 2013-09-30: qty 2

## 2013-09-30 MED ORDER — EPHEDRINE SULFATE 50 MG/ML IJ SOLN
INTRAMUSCULAR | Status: DC | PRN
  Administered 2013-09-30: 10 mg via INTRAVENOUS

## 2013-09-30 MED ORDER — ONDANSETRON HCL 4 MG/2ML IJ SOLN
4.0000 mg | Freq: Four times a day (QID) | INTRAMUSCULAR | Status: DC | PRN
  Filled 2013-09-30: qty 2

## 2013-09-30 MED ORDER — SODIUM CHLORIDE 0.9 % IR SOLN
Status: DC | PRN
  Administered 2013-09-30: 2000 mL

## 2013-09-30 MED ORDER — LIDOCAINE HCL (CARDIAC) 20 MG/ML IV SOLN
INTRAVENOUS | Status: DC | PRN
  Administered 2013-09-30: 50 mg via INTRAVENOUS

## 2013-09-30 MED ORDER — DEXAMETHASONE SODIUM PHOSPHATE 4 MG/ML IJ SOLN
INTRAMUSCULAR | Status: DC | PRN
  Administered 2013-09-30: 10 mg via INTRAVENOUS

## 2013-09-30 MED ORDER — BELLADONNA ALKALOIDS-OPIUM 16.2-60 MG RE SUPP
RECTAL | Status: AC
  Filled 2013-09-30: qty 1

## 2013-09-30 MED ORDER — SODIUM CHLORIDE 0.9 % IJ SOLN
3.0000 mL | Freq: Two times a day (BID) | INTRAMUSCULAR | Status: DC
  Filled 2013-09-30: qty 3

## 2013-09-30 MED ORDER — OXYCODONE HCL 5 MG PO TABS
5.0000 mg | ORAL_TABLET | ORAL | Status: DC | PRN
  Filled 2013-09-30: qty 2

## 2013-09-30 MED ORDER — OXYCODONE-ACETAMINOPHEN 5-325 MG PO TABS: 1.0000 | ORAL_TABLET | ORAL | Status: DC | PRN

## 2013-09-30 MED ORDER — FENTANYL CITRATE 0.05 MG/ML IJ SOLN
INTRAMUSCULAR | Status: DC | PRN
  Administered 2013-09-30: 25 ug via INTRAVENOUS
  Administered 2013-09-30: 50 ug via INTRAVENOUS
  Administered 2013-09-30: 25 ug via INTRAVENOUS

## 2013-09-30 MED ORDER — PROMETHAZINE HCL 25 MG/ML IJ SOLN
6.2500 mg | INTRAMUSCULAR | Status: DC | PRN
  Filled 2013-09-30: qty 1

## 2013-09-30 MED ORDER — PROPOFOL 10 MG/ML IV BOLUS
INTRAVENOUS | Status: DC | PRN
  Administered 2013-09-30: 150 mg via INTRAVENOUS

## 2013-09-30 SURGICAL SUPPLY — 42 items
BAG DRAIN URO-CYSTO SKYTR STRL (DRAIN) ×2 IMPLANT
BAG DRN UROCATH (DRAIN) ×1
BASKET LASER NITINOL 1.9FR (BASKET) IMPLANT
BASKET SEGURA 3FR (UROLOGICAL SUPPLIES) IMPLANT
BASKET STNLS GEMINI 4WIRE 3FR (BASKET) IMPLANT
BASKET ZERO TIP NITINOL 2.4FR (BASKET) ×1 IMPLANT
BRUSH URET BIOPSY 3F (UROLOGICAL SUPPLIES) IMPLANT
BSKT STON RTRVL 120 1.9FR (BASKET)
BSKT STON RTRVL GEM 120X11 3FR (BASKET)
BSKT STON RTRVL ZERO TP 2.4FR (BASKET) ×1
CANISTER SUCT LVC 12 LTR MEDI- (MISCELLANEOUS) ×1 IMPLANT
CATH URET 5FR 28IN CONE TIP (BALLOONS)
CATH URET 5FR 28IN OPEN ENDED (CATHETERS) IMPLANT
CATH URET 5FR 70CM CONE TIP (BALLOONS) IMPLANT
CLOTH BEACON ORANGE TIMEOUT ST (SAFETY) ×2 IMPLANT
DRAPE CAMERA CLOSED 9X96 (DRAPES) ×2 IMPLANT
ELECT REM PT RETURN 9FT ADLT (ELECTROSURGICAL)
ELECTRODE REM PT RTRN 9FT ADLT (ELECTROSURGICAL) IMPLANT
FIBER LASER FLEXIVA 1000 (UROLOGICAL SUPPLIES) IMPLANT
FIBER LASER FLEXIVA 200 (UROLOGICAL SUPPLIES) IMPLANT
FIBER LASER FLEXIVA 365 (UROLOGICAL SUPPLIES) ×1 IMPLANT
FIBER LASER FLEXIVA 550 (UROLOGICAL SUPPLIES) IMPLANT
GLOVE BIOGEL PI IND STRL 7.5 (GLOVE) IMPLANT
GLOVE BIOGEL PI INDICATOR 7.5 (GLOVE) ×2
GLOVE SURG SS PI 7.5 STRL IVOR (GLOVE) ×1 IMPLANT
GLOVE SURG SS PI 8.0 STRL IVOR (GLOVE) ×2 IMPLANT
GOWN STRL NON-REIN LRG LVL3 (GOWN DISPOSABLE) ×2 IMPLANT
GOWN STRL REIN XL XLG (GOWN DISPOSABLE) ×2 IMPLANT
GUIDEWIRE 0.038 PTFE COATED (WIRE) IMPLANT
GUIDEWIRE ANG ZIPWIRE 038X150 (WIRE) IMPLANT
GUIDEWIRE STR DUAL SENSOR (WIRE) ×2 IMPLANT
IV NS 1000ML (IV SOLUTION) ×4
IV NS 1000ML BAXH (IV SOLUTION) IMPLANT
IV NS IRRIG 3000ML ARTHROMATIC (IV SOLUTION) ×2 IMPLANT
KIT BALLIN UROMAX 15FX10 (LABEL) IMPLANT
KIT BALLN UROMAX 15FX4 (MISCELLANEOUS) IMPLANT
KIT BALLN UROMAX 26 75X4 (MISCELLANEOUS)
PACK CYSTOSCOPY (CUSTOM PROCEDURE TRAY) ×2 IMPLANT
SET HIGH PRES BAL DIL (LABEL)
SHEATH ACCESS URETERAL 38CM (SHEATH) IMPLANT
SHEATH ACCESS URETERAL 54CM (SHEATH) IMPLANT
STENT URET 6FRX24 CONTOUR (STENTS) ×1 IMPLANT

## 2013-09-30 NOTE — Progress Notes (Signed)
Dr. Jeffie Pollock paged and informed of large amt urine dribbled with tape unsecured and string longer. Stent removed per order.  Pt tolerated well.  Pt and wife informed to call the office if he has difficulty voiding, has pain unrelieved by pain med., urine stream diminishes or stops completely.  Don't wait til office opens if it occurs at night.  Please call whenever s&s occurs. Pt and wife verbalized their understanding.

## 2013-09-30 NOTE — Anesthesia Procedure Notes (Signed)
Procedure Name: LMA Insertion Date/Time: 09/30/2013 7:35 AM Performed by: Bethena Roys T Pre-anesthesia Checklist: Patient identified, Emergency Drugs available, Suction available and Patient being monitored Patient Re-evaluated:Patient Re-evaluated prior to inductionOxygen Delivery Method: Circle System Utilized Preoxygenation: Pre-oxygenation with 100% oxygen Intubation Type: IV induction Ventilation: Mask ventilation without difficulty LMA: LMA inserted LMA Size: 5.0 Number of attempts: 1 Placement Confirmation: positive ETCO2 Dental Injury: Teeth and Oropharynx as per pre-operative assessment  Comments: R lower molar with crown extremely loose.  Gums red with decay at root.  LMA inserted carefully without contact with tooth.  LMA positioned between gap in upper front teeth.

## 2013-09-30 NOTE — Anesthesia Preprocedure Evaluation (Addendum)
Anesthesia Evaluation  Patient identified by MRN, date of birth, ID band Patient awake    Reviewed: Allergy & Precautions, H&P , NPO status , Patient's Chart, lab work & pertinent test results  Airway Mallampati: II TM Distance: >3 FB Neck ROM: Full    Dental  (+) Partial Upper, Dental Advisory Given and Loose,    Pulmonary neg pulmonary ROS,  breath sounds clear to auscultation  Pulmonary exam normal       Cardiovascular Exercise Tolerance: Good negative cardio ROS  + dysrhythmias Rhythm:Regular Rate:Normal  ECG: RBBB (old)   Neuro/Psych PSYCHIATRIC DISORDERS negative neurological ROS     GI/Hepatic negative GI ROS, Neg liver ROS,   Endo/Other  Hypothyroidism   Renal/GU negative Renal ROS  negative genitourinary   Musculoskeletal negative musculoskeletal ROS (+)   Abdominal   Peds negative pediatric ROS (+)  Hematology negative hematology ROS (+)   Anesthesia Other Findings   Reproductive/Obstetrics negative OB ROS                          Anesthesia Physical Anesthesia Plan  ASA: II  Anesthesia Plan: General   Post-op Pain Management:    Induction: Intravenous  Airway Management Planned: LMA  Additional Equipment:   Intra-op Plan:   Post-operative Plan: Extubation in OR  Informed Consent: I have reviewed the patients History and Physical, chart, labs and discussed the procedure including the risks, benefits and alternatives for the proposed anesthesia with the patient or authorized representative who has indicated his/her understanding and acceptance.   Dental advisory given  Plan Discussed with: CRNA  Anesthesia Plan Comments: (LMA #5 07-08-13)        Anesthesia Quick Evaluation

## 2013-09-30 NOTE — Discharge Instructions (Addendum)
Ureteral Stent Implantation, Care After Refer to this sheet in the next few weeks. These instructions provide you with information on caring for yourself after your procedure. Your health care provider may also give you more specific instructions. Your treatment has been planned according to current medical practices, but problems sometimes occur. Call your health care provider if you have any problems or questions after your procedure. WHAT TO EXPECT AFTER THE PROCEDURE You should be back to normal activity within 48 hours after the procedure. Nausea and vomiting may occur and are commonly the result of anesthesia. It is common to experience sharp pain in the back or lower abdomen and penis with voiding. This is caused by movement of the ends of the stent with the act of urinating.It usually goes away within minutes after you have stopped urinating. HOME CARE INSTRUCTIONS Make sure to drink plenty of fluids. You may have small amounts of bleeding, causing your urine to be red. This is normal. Certain movements may trigger pain or a feeling that you need to urinate. You may be given medications to prevent infection or bladder spasms. Be sure to take all medications as directed. Only take over-the-counter or prescription medicines for pain, discomfort, or fever as directed by your health care provider. Do not take aspirin, as this can make bleeding worse. Your stent will be left in until the blockage is resolved. This may take 2 weeks or longer, depending on the reason for stent implantation. You may have an X-ray exam to make sure your ureter is open and that the stent has not moved out of position (migrated). The stent can be removed by your health care provider in the office. Medications may be given for comfort while the stent is being removed. Be sure to keep all follow-up appointments so your health care provider can check that you are healing properly. SEEK MEDICAL CARE IF:  You experience  increasing pain.  Your pain medication is not working. SEEK IMMEDIATE MEDICAL CARE IF:  Your urine is dark red or has blood clots.  You are leaking urine (incontinent).  You have a fever, chills, feeling sick to your stomach (nausea), or vomiting.  Your pain is not relieved by pain medication.  The end of the stent comes out of the urethra.  You are unable to urinate. Document Released: 05/14/2013 Document Reviewed: 02/27/2013 [facility] Patient Information 2014 [place].  You may pull stent by the attached string on Friday morning and if you don't feel comfortable doing that you can come to the office.  Post Anesthesia Home Care Instructions  Activity: Get plenty of rest for the remainder of the day. A responsible adult should stay with you for 24 hours following the procedure.  For the next 24 hours, DO NOT: -Drive a car -Paediatric nurse -Drink alcoholic beverages -Take any medication unless instructed by your physician -Make any legal decisions or sign important papers.  Meals: Start with liquid foods such as gelatin or soup. Progress to regular foods as tolerated. Avoid greasy, spicy, heavy foods. If nausea and/or vomiting occur, drink only clear liquids until the nausea and/or vomiting subsides. Call your physician if vomiting continues.  Special Instructions/Symptoms: Your throat may feel dry or sore from the anesthesia or the breathing tube placed in your throat during surgery. If this causes discomfort, gargle with warm salt water. The discomfort should disappear within 24 hours.

## 2013-09-30 NOTE — Anesthesia Postprocedure Evaluation (Signed)
  Anesthesia Post-op Note  Patient: [name] [name]  Procedure(s) Performed: Procedure(s) (LRB): LEFT CYSTOSCOPY WITH URETEROSCOPY, STONE EXTRACTION  AND STENT REPLACEMENT (Left) HOLMIUM LASER APPLICATION (Left)  Patient Location: PACU  Anesthesia Type: General  Level of Consciousness: awake and alert   Airway and Oxygen Therapy: Patient Spontanous Breathing  Post-op Pain: mild  Post-op Assessment: Post-op Vital signs reviewed, Patient's Cardiovascular Status Stable, Respiratory Function Stable, Patent Airway and No signs of Nausea or vomiting  Last Vitals:  Filed Vitals:   09/30/13 0900  BP: 118/67  Pulse: 59  Temp:   Resp: 14    Post-op Vital Signs: stable   Complications: No apparent anesthesia complications. Loose tooth is still in place.

## 2013-09-30 NOTE — Transfer of Care (Signed)
Immediate Anesthesia Transfer of Care Note  Patient: [name] [name]  Procedure(s) Performed: Procedure(s): LEFT CYSTOSCOPY WITH URETEROSCOPY, STONE EXTRACTION  AND STENT REPLACEMENT (Left) HOLMIUM LASER APPLICATION (Left)  Patient Location: PACU  Anesthesia Type:General  Level of Consciousness: awake, alert  and oriented  Airway & Oxygen Therapy: Patient Spontanous Breathing and Patient connected to nasal cannula oxygen  Post-op Assessment: Report given to PACU RN  Post vital signs: Reviewed and stable  Complications: No apparent anesthesia complications

## 2013-09-30 NOTE — Interval H&P Note (Signed)
History and Physical Interval Note:  09/30/2013 7:23 AM  [name] [name]  has presented today for surgery, with the diagnosis of LEFT URETERAL STONE  The various methods of treatment have been discussed with the patient and family. After consideration of risks, benefits and other options for treatment, the patient has consented to  Procedure(s): LEFT CYSTOSCOPY WITH URETEROSCOPY, STONE EXTRACTION  AND STENT REPLACEMENT (Left) HOLMIUM LASER APPLICATION (Left) as a surgical intervention .  The patient's history has been reviewed, patient examined, no change in status, stable for surgery.  I have reviewed the patient's chart and labs.  Questions were answered to the patient's satisfaction.     Danyell Awbrey J

## 2013-09-30 NOTE — Brief Op Note (Signed)
09/30/2013  8:16 AM  PATIENT:  [name] [name]  80 y.o. male  PRE-OPERATIVE DIAGNOSIS:  LEFT URETERAL STONE  POST-OPERATIVE DIAGNOSIS:  LEFT URETERAL STONE  PROCEDURE:  Procedure(s): LEFT CYSTOSCOPY WITH URETEROSCOPY, STONE EXTRACTION  AND STENT REPLACEMENT (Left) HOLMIUM LASER APPLICATION (Left) Left stent removal  SURGEON:  Surgeon(s) and Role:    * [name], MD - Primary  PHYSICIAN ASSISTANT:   ASSISTANTS: none   ANESTHESIA:   general  EBL:  Total I/O In: 450 [I.V.:450] Out: -   BLOOD ADMINISTERED:none  DRAINS: 6 x 24 left JJ stent   LOCAL MEDICATIONS USED:  NONE  SPECIMEN:  Source of Specimen:  stone fragments  DISPOSITION OF SPECIMEN:  to family to bring to office  COUNTS:  YES  TOURNIQUET:  * No tourniquets in log *  DICTATION: .Other Dictation: Dictation Number [id number]  PLAN OF CARE: Discharge to home after PACU  PATIENT DISPOSITION:  PACU - hemodynamically stable.   Delay start of Pharmacological VTE agent (>24hrs) due to surgical blood loss or risk of bleeding: not applicable

## 2013-10-01 ENCOUNTER — Encounter (HOSPITAL_BASED_OUTPATIENT_CLINIC_OR_DEPARTMENT_OTHER): Payer: Self-pay | Admitting: Urology

## 2013-10-01 NOTE — Op Note (Signed)
NAME:  [name] [name] NO.:  [id number]  MEDICAL RECORD NO.:  [id number]  LOCATION:                                 FACILITY:  PHYSICIAN:  Marshall Cork. Jeffie Pollock, M.D.    DATE OF BIRTH:  [birth year]  DATE OF PROCEDURE:  09/30/2013 DATE OF DISCHARGE:                              OPERATIVE REPORT   PROCEDURE:  Cystoscopy with removal of left double-J stent, ureteroscopy with holmium laser trips and stone extraction, and replacement of left double-J stent.  PREOPERATIVE DIAGNOSIS:  Left distal ureteral stone.  POSTOPERATIVE DIAGNOSIS:  Left distal ureteral stone.  SURGEON:  Marshall Cork. Jeffie Pollock, M.D.  ANESTHESIA:  General.  SPECIMEN:  Stone fragments.  DRAINS:  A 6-French 24 cm double-J stent.  COMPLICATIONS:  None.  INDICATIONS:  [name] [name] is a 80 year old white male who has a history of prostate cancer, treated with seeds and a rising PSA.  He was found to have an approximately 1 cm stone in the left distal ureter with obstruction and a prior attempt at removal of the stone was unsuccessful due to recurrent prostate cancer infiltrating the trigone.  He did undergo a partial TURP at that time, and was sent to Radiology where percutaneous nephrostomy tube was placed.  The nephrostomy tube was eventually internalized with an antegrade stent.  He returns today for definitive stone removal.  FINDINGS AND PROCEDURE:  He was given Cipro.  He was taken to the operating room where general anesthetic was induced.  He was placed in lithotomy position and fitted with PAS hose.  His perineum and genitalia were prepped with Betadine solution.  He was draped in usual sterile fashion.  Cystoscopy was performed using a 22-French scope and 12-degree lens. Examination revealed a normal urethra.  The external sphincter was intact.  The prostatic urethra had post TUR findings with shaggy exudate on the resected portion of the prostate.  There was a stent with some mild incrustation  exiting the left ureter.  The stent was grasped with grasping forceps and pulled the urethral meatus and a guidewire was passed through the stent.  Initially the guidewire did not go to the kidney and turned back on itself because the stent had not fully straightened.  The stent was removed and a 5-French open-end catheter was then placed over the wire.  The wire was pulled back into the open-end catheter and advanced to the kidney and the open-end catheter was removed.  A 6-French short ureteroscope was then passed alongside the wire up the ureteral orifice.  The stone was visualized.  There were actually 2 stones in place, the smaller was grasped with a basket and removed intact, however, the larger was too large to remove it intact.  The 365 micron holmium laser fiber was then passed, this was initially set on 0.5, but eventually carried up to 1 joule at 5 Hz.  The stone was then engaged and broken into manageable fragments, which were then retrieved with a basket.  Once all significant fragments were retrieved, inspection revealed a small amount of 1-2 mm fragments that could not be engaged with the basket.  At this point, because of the number of passes  of the ureteroscope and some inflammatory changes from the procedure, I elected to replace the stent.  A 22-French cystoscope was then replaced over the wire and a 6- French 24 cm double-J stent with string was inserted.  The wire was removed leaving good coil in the bladder and a good coil in the kidney. The bladder was then drained.  Cystoscope was removed leaving the stent string exiting urethra.  The string was secured to the patient's penis. B and O suppository was placed.  He was taken down from lithotomy position, his anesthetic was reversed, he was moved to the recovery room in a stable condition.  There were no complications.     Marshall Cork. Jeffie Pollock, M.D.     JJW/MEDQ  D:  09/30/2013  T:  10/01/2013  Job:  [id number]

## 2013-10-02 LAB — POCT HEMOGLOBIN-HEMACUE: Hemoglobin: 11.9 g/dL — ABNORMAL LOW (ref 13.0–17.0)

## 2013-10-08 DIAGNOSIS — N201 Calculus of ureter: Secondary | ICD-10-CM | POA: Diagnosis not present

## 2013-10-08 DIAGNOSIS — C61 Malignant neoplasm of prostate: Secondary | ICD-10-CM | POA: Diagnosis not present

## 2013-10-08 DIAGNOSIS — R82998 Other abnormal findings in urine: Secondary | ICD-10-CM | POA: Diagnosis not present

## 2013-10-20 DIAGNOSIS — N200 Calculus of kidney: Secondary | ICD-10-CM | POA: Diagnosis not present

## 2013-10-20 DIAGNOSIS — R82998 Other abnormal findings in urine: Secondary | ICD-10-CM | POA: Diagnosis not present

## 2013-10-20 DIAGNOSIS — C61 Malignant neoplasm of prostate: Secondary | ICD-10-CM | POA: Diagnosis not present

## 2013-11-19 DIAGNOSIS — C61 Malignant neoplasm of prostate: Secondary | ICD-10-CM | POA: Diagnosis not present

## 2013-12-18 DIAGNOSIS — C61 Malignant neoplasm of prostate: Secondary | ICD-10-CM | POA: Diagnosis not present

## 2014-01-19 DIAGNOSIS — C61 Malignant neoplasm of prostate: Secondary | ICD-10-CM | POA: Diagnosis not present

## 2014-01-19 DIAGNOSIS — N1330 Unspecified hydronephrosis: Secondary | ICD-10-CM | POA: Diagnosis not present

## 2014-01-19 DIAGNOSIS — N3941 Urge incontinence: Secondary | ICD-10-CM | POA: Diagnosis not present

## 2014-01-19 DIAGNOSIS — R82998 Other abnormal findings in urine: Secondary | ICD-10-CM | POA: Diagnosis not present

## 2014-02-10 DIAGNOSIS — E039 Hypothyroidism, unspecified: Secondary | ICD-10-CM | POA: Diagnosis not present

## 2014-02-10 DIAGNOSIS — J309 Allergic rhinitis, unspecified: Secondary | ICD-10-CM | POA: Diagnosis not present

## 2014-02-10 DIAGNOSIS — M25519 Pain in unspecified shoulder: Secondary | ICD-10-CM | POA: Diagnosis not present

## 2014-02-10 DIAGNOSIS — Z1331 Encounter for screening for depression: Secondary | ICD-10-CM | POA: Diagnosis not present

## 2014-02-18 DIAGNOSIS — C61 Malignant neoplasm of prostate: Secondary | ICD-10-CM | POA: Diagnosis not present

## 2014-03-19 DIAGNOSIS — C61 Malignant neoplasm of prostate: Secondary | ICD-10-CM | POA: Diagnosis not present

## 2014-04-13 DIAGNOSIS — C61 Malignant neoplasm of prostate: Secondary | ICD-10-CM | POA: Diagnosis not present

## 2014-04-20 DIAGNOSIS — R82998 Other abnormal findings in urine: Secondary | ICD-10-CM | POA: Diagnosis not present

## 2014-04-20 DIAGNOSIS — C61 Malignant neoplasm of prostate: Secondary | ICD-10-CM | POA: Diagnosis not present

## 2014-05-04 DIAGNOSIS — L03319 Cellulitis of trunk, unspecified: Secondary | ICD-10-CM | POA: Diagnosis not present

## 2014-05-04 DIAGNOSIS — L02219 Cutaneous abscess of trunk, unspecified: Secondary | ICD-10-CM | POA: Diagnosis not present

## 2014-05-25 DIAGNOSIS — M25519 Pain in unspecified shoulder: Secondary | ICD-10-CM | POA: Diagnosis not present

## 2014-05-25 DIAGNOSIS — Z0000 Encounter for general adult medical examination without abnormal findings: Secondary | ICD-10-CM | POA: Diagnosis not present

## 2014-05-25 DIAGNOSIS — C61 Malignant neoplasm of prostate: Secondary | ICD-10-CM | POA: Diagnosis not present

## 2014-05-25 DIAGNOSIS — Z136 Encounter for screening for cardiovascular disorders: Secondary | ICD-10-CM | POA: Diagnosis not present

## 2014-05-25 DIAGNOSIS — L03319 Cellulitis of trunk, unspecified: Secondary | ICD-10-CM | POA: Diagnosis not present

## 2014-05-25 DIAGNOSIS — E039 Hypothyroidism, unspecified: Secondary | ICD-10-CM | POA: Diagnosis not present

## 2014-05-25 DIAGNOSIS — L02219 Cutaneous abscess of trunk, unspecified: Secondary | ICD-10-CM | POA: Diagnosis not present

## 2014-05-25 DIAGNOSIS — Z1331 Encounter for screening for depression: Secondary | ICD-10-CM | POA: Diagnosis not present

## 2014-05-25 DIAGNOSIS — Z23 Encounter for immunization: Secondary | ICD-10-CM | POA: Diagnosis not present

## 2014-05-25 DIAGNOSIS — Z8546 Personal history of malignant neoplasm of prostate: Secondary | ICD-10-CM | POA: Diagnosis not present

## 2014-08-14 DIAGNOSIS — C61 Malignant neoplasm of prostate: Secondary | ICD-10-CM | POA: Diagnosis not present

## 2014-08-24 DIAGNOSIS — C61 Malignant neoplasm of prostate: Secondary | ICD-10-CM | POA: Diagnosis not present

## 2014-08-24 DIAGNOSIS — N3946 Mixed incontinence: Secondary | ICD-10-CM | POA: Diagnosis not present

## 2014-08-24 DIAGNOSIS — N390 Urinary tract infection, site not specified: Secondary | ICD-10-CM | POA: Diagnosis not present

## 2015-03-25 ENCOUNTER — Other Ambulatory Visit: Payer: Self-pay | Admitting: Urology

## 2015-03-25 DIAGNOSIS — C61 Malignant neoplasm of prostate: Secondary | ICD-10-CM

## 2015-03-31 ENCOUNTER — Telehealth: Payer: Self-pay | Admitting: Oncology

## 2015-03-31 NOTE — Telephone Encounter (Signed)
Referring Dr. [name] Dx- Prostate Ca  Appt confirmed for 07/08 @ 1:30 w/Dr. Alen Blew

## 2015-04-01 ENCOUNTER — Ambulatory Visit: Payer: Medicare HMO

## 2015-04-01 ENCOUNTER — Telehealth: Payer: Self-pay | Admitting: Oncology

## 2015-04-01 ENCOUNTER — Encounter: Payer: Self-pay | Admitting: Oncology

## 2015-04-01 ENCOUNTER — Ambulatory Visit (HOSPITAL_BASED_OUTPATIENT_CLINIC_OR_DEPARTMENT_OTHER): Payer: Medicare HMO | Admitting: Oncology

## 2015-04-01 VITALS — BP 161/74 | HR 81 | Temp 98.0°F | Resp 20 | Ht 69.0 in | Wt 233.9 lb

## 2015-04-01 DIAGNOSIS — C61 Malignant neoplasm of prostate: Secondary | ICD-10-CM | POA: Diagnosis not present

## 2015-04-01 NOTE — Telephone Encounter (Signed)
Gave and pirnted appt sched and avs for pt for AUG

## 2015-04-01 NOTE — Consult Note (Signed)
Reason for Referral: Prostate cancer.   HPI: 81 year old gentleman native of [place] but have been living in this area for 50 years. 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 He does have history of nephrolithiasis and hypothyroidism but for the most part in reasonable health. He was diagnosed with prostate cancer about 18 years ago and had seed implants at that time. He is Gleason score was 9 although it was unknown to me what was his PSA. He did develop recurrent disease in October 2014 and required a channel TUR. He subsequently was started on firmagon which controlled his PSA reasonably well. His most recently his PSA was up to 1.91 in June 2016 after a rise from 0.78 in March 2016. Before that was 0.53 in November 2015. He is scheduled to have repeat imaging studies including CT scan of the bone scan for staging purposes. His testosterone is at 41 castrate levels. And he most recently was switched from [place] on to Lupron because of the local reaction. He does report symptoms of incontinence but no other symptoms at this time. He has gained about 20 pounds on hormone therapy since 2015 when he was started. He does not report any new pain at this time or pathological fractures. He continues to be independent and performs activities of daily living without any decline.  He does not report any headaches, blurry vision, syncope or seizures. He does not report any fevers, chills, sweats or weight loss. He is not reporting any chest pain, palpitation, orthopnea or leg edema. He does not report any cough or hemoptysis or hematemesis. He does not report any nausea, vomiting, abdominal pain, hematochezia, melanoma. He does report incontinence but no hematuria, dysuria, nocturia. He does not report any skeletal complaints. Remainder review of systems unremarkable.   Past Medical History  Diagnosis Date  . Left ureteral calculus   . History of prostate cancer     S/P  RADIOACTIVE SEED IMPLANTS 1997  . Arthritis   . Hypothyroidism   . History of kidney stones   . Organic impotence   . History of urinary tract obstruction     BNC  . Chronic left shoulder pain     AND DECREASE ROM--  END-STAGE OA  . OA (osteoarthritis)   . Wears glasses   . Urinary incontinence   . History of cardiac murmur     ASYMPTOMATIC  . Allergic rhinitis   . Incomplete RBBB   :  Past Surgical History  Procedure Laterality Date  . Cysto/  unroofing right ureterocele and stone manipulation  02-23-2011  . Total hip arthroplasty Right 06-29-2009  . Orif left ankle fx  01-15-2002  . Radioactive prostate seed implants  1997  . Extracorporeal shock wave lithotripsy    . Cystoscopy with ureteroscopy, stone basketry and stent placement Left 07/08/2013    Procedure: LEFT  URETEROSCOPY,;  Surgeon: [name], MD;  Location: [facility];  Service: Urology;  Laterality: Left;  . Transurethral resection of bladder neck N/A 07/08/2013    Procedure: TRANSURETHRAL RESECTION OF BLADDER NECK;  Surgeon: [name], MD;  Location: [facility];  Service: Urology;  Laterality: N/A;  resection of baldder neck  . Percutaneous nephrostomy Left 07/16/13    removed 07/21/13  . Ureteral stent placement Left 07/21/13  . Eye surgery Bilateral 09/22/13    cataract w/lens  . Cystoscopy with ureteroscopy and stent placement Left 09/30/2013    Procedure:  LEFT CYSTOSCOPY WITH URETEROSCOPY, STONE EXTRACTION  AND STENT REPLACEMENT;  Surgeon: [name], MD;  Location: [facility];  Service: Urology;  Laterality: Left;  . Holmium laser application Left 03/31/1164    Procedure: HOLMIUM LASER APPLICATION;  Surgeon: [name], MD;  Location: [facility];  Service: Urology;  Laterality: Left;  :   Current outpatient prescriptions:  .  fluticasone (FLONASE) 50 MCG/ACT nasal spray, Place 2 sprays into the nose daily., Disp: , Rfl:  .  levothyroxine  (SYNTHROID, LEVOTHROID) 150 MCG tablet, Take 150 mcg by mouth daily before breakfast., Disp: , Rfl:  .  meloxicam (MOBIC) 7.5 MG tablet, Take 7.5 mg by mouth 2 (two) times daily. , Disp: , Rfl:  .  mirabegron ER (MYRBETRIQ) 50 MG TB24 tablet, Take 50 mg by mouth daily., Disp: , Rfl:  .  oxyCODONE-acetaminophen (ROXICET) 5-325 MG per tablet, Take 1 tablet by mouth every 4 (four) hours as needed for severe pain., Disp: 20 tablet, Rfl: 0 .  ferrous sulfate 325 (65 FE) MG EC tablet, Take 325 mg by mouth 3 (three) times daily with meals., Disp: , Rfl: :  No Known Allergies:  No family history on file.:  History   Social History  . Marital Status: Married    Spouse Name: N/A  . Number of Children: N/A  . Years of Education: N/A   Occupational History  . Not on file.   Social History Main Topics  . Smoking status: Never Smoker   . Smokeless tobacco: Never Used  . Alcohol Use: No  . Drug Use: No  . Sexual Activity: Not on file   Other Topics Concern  . Not on file   Social History Narrative  . No narrative on file  :  Pertinent items are noted in HPI.  Exam: Blood pressure 161/74, pulse 81, temperature 98 F (36.7 C), temperature source Oral, resp. rate 20, height 5\' 9"  (1.753 m), weight 233 lb 14.4 oz (106.096 kg), SpO2 96 %. General appearance: alert and cooperative Head: Normocephalic, without obvious abnormality Throat: lips, mucosa, and tongue normal; teeth and gums normal Neck: no adenopathy Back: negative Resp: clear to auscultation bilaterally Chest wall: no tenderness Cardio: regular rate and rhythm, S1, S2 normal, no murmur, click, rub or gallop GI: soft, non-tender; bowel sounds normal; no masses,  no organomegaly Extremities: extremities normal, atraumatic, no cyanosis or edema Pulses: 2+ and symmetric Skin: Skin color, texture, turgor normal. No rashes or lesions Lymph nodes: Cervical, supraclavicular, and axillary nodes normal. Neurologic: Grossly  normal  CBC    Component Value Date/Time   WBC 6.4 07/21/2013 0745   RBC 3.96* 07/21/2013 0745   HGB 11.9* 09/30/2013 0710   HCT 37.6* 07/21/2013 0745   PLT 176 07/21/2013 0745   MCV 94.9 07/21/2013 0745   MCH 31.8 07/21/2013 0745   MCHC 33.5 07/21/2013 0745   RDW 12.6 07/21/2013 0745   LYMPHSABS 1.6 07/16/2013 1155   MONOABS 0.5 07/16/2013 1155   EOSABS 0.2 07/16/2013 1155   BASOSABS 0.0 07/16/2013 1155      Assessment and Plan:   81 year old gentleman with the following issues:  1. Prostate cancer diagnosed in 1998 after presenting with a Gleason score 9 and received definitive therapy with seed implants. He developed recurrent disease in 2014 and have been on hormone therapy since 2015. He had a reasonable response initially and his last PSA was up to 1.91 in June 2016 from 0.78 in March 2016. He is quite  asymptomatic at this time and his staging workup is pending.  The natural course of this disease was discussed today extensively. He understands any treatment at this point would be palliative and these options include second line hormonal therapies with ketoconazole, xtandi, Zytiga, Provenge immunotherapy and systemic chemotherapy. Depending on his staging workup any these options could be a possibility.  He desires to maintain his quality of life as much as possible and for that reason we would like to avoid systemic chemotherapy as long as possible.  Oral hormonal agents may be a appropriate option for him moving forward. We'll make that determination after results of his imaging studies.  2. Hormone therapy: I have recommended continuing that for the time being.  3. Follow-up: Will be next few weeks after completing of his imaging studies.

## 2015-04-01 NOTE — Progress Notes (Signed)
Please see consult note.  

## 2015-04-01 NOTE — Progress Notes (Signed)
Checked in new pt with no financial concerns.  Pt has my card for any billing questions or concerns. ° °

## 2015-04-08 ENCOUNTER — Ambulatory Visit (HOSPITAL_COMMUNITY)
Admission: RE | Admit: 2015-04-08 | Discharge: 2015-04-08 | Disposition: A | Discharge status: Home / Self Care | Payer: Medicare HMO | Source: Ambulatory Visit | Attending: Urology | Admitting: Urology

## 2015-04-08 DIAGNOSIS — R937 Abnormal findings on diagnostic imaging of other parts of musculoskeletal system: Secondary | ICD-10-CM | POA: Diagnosis not present

## 2015-04-08 DIAGNOSIS — C61 Malignant neoplasm of prostate: Secondary | ICD-10-CM

## 2015-04-08 MED ORDER — TECHNETIUM TC 99M MEDRONATE IV KIT
27.5000 | PACK | Freq: Once | INTRAVENOUS | Status: AC | PRN
  Administered 2015-04-08: 27.5 via INTRAVENOUS

## 2015-04-27 ENCOUNTER — Ambulatory Visit (HOSPITAL_BASED_OUTPATIENT_CLINIC_OR_DEPARTMENT_OTHER): Payer: Medicare HMO | Admitting: Oncology

## 2015-04-27 ENCOUNTER — Telehealth: Payer: Self-pay | Admitting: Oncology

## 2015-04-27 ENCOUNTER — Encounter: Payer: Self-pay | Admitting: Oncology

## 2015-04-27 VITALS — BP 162/67 | HR 78 | Temp 98.1°F | Resp 18 | Ht 69.0 in | Wt 235.9 lb

## 2015-04-27 DIAGNOSIS — C61 Malignant neoplasm of prostate: Secondary | ICD-10-CM

## 2015-04-27 DIAGNOSIS — C7951 Secondary malignant neoplasm of bone: Secondary | ICD-10-CM

## 2015-04-27 MED ORDER — ENZALUTAMIDE 40 MG PO CAPS: 160.0000 mg | ORAL_CAPSULE | Freq: Every day | ORAL | Status: DC

## 2015-04-27 NOTE — Progress Notes (Signed)
Script for xtandi given to raquel in managed care for assistance. Patient and wife given Neurosurgeon.

## 2015-04-27 NOTE — Progress Notes (Signed)
I faxed [facility] request xtandi [phone]

## 2015-04-27 NOTE — Telephone Encounter (Signed)
per pof to sch pt appt-gave pt copy of avs °

## 2015-04-27 NOTE — Progress Notes (Signed)
Hematology and Oncology Follow Up Visit  [name] [name] [id number] 1931-12-02 81 y.o. 04/27/2015 3:27 PM [name] [name] [name] [name], [name] Baltimore, MD   Principle Diagnosis: 81 year old gentleman diagnosed with prostate cancer in 1998 after presenting with a Gleason score of 9. 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   Prior Therapy: He did develop recurrent disease in October 2014 and required a channel TUR. He subsequently was started on firmagon which controlled his PSA reasonably well.  His most recently his PSA was up to 1.91 in June 2016 after a rise from 0.78 in March 2016. Before that was 0.53 in November 2015.   Current therapy: Androgen deprivation. He is under consideration for second line therapy.  Interim History: [name] is as today for a follow-up visit with his wife. Since the last visit, he reports no complaints. He does not report any pathological fractures or bone pain. He continues to gain weight and his mobility becomes more problematic. He is able to drive short distances and still works part-time at a [employer]. He has not reported any constitutional symptoms. He has not reported any new urinary symptoms.  He does not report any headaches, blurry vision, syncope or seizures. He does not report any fevers, chills, sweats or weight loss. He is not reporting any chest pain, palpitation, orthopnea or leg edema. He does not report any cough or hemoptysis or hematemesis. He does not report any nausea, vomiting, abdominal pain, hematochezia, melanoma. He does report incontinence but no hematuria, dysuria, nocturia. He does not report any skeletal complaints. Remainder review of systems unremarkable.   Medications: I have reviewed the patient's current medications.  Current Outpatient Prescriptions  Medication Sig Dispense Refill  . levothyroxine (SYNTHROID, LEVOTHROID) 150 MCG tablet Take 150 mcg by mouth daily before breakfast.    . meloxicam (MOBIC) 7.5  MG tablet Take 7.5 mg by mouth 2 (two) times daily.     . enzalutamide (XTANDI) 40 MG capsule Take 4 capsules (160 mg total) by mouth daily. 120 capsule 0   No current facility-administered medications for this visit.     Allergies: No Known Allergies  Past Medical History, Surgical history, Social history, and Family History were reviewed and updated.   Physical Exam: Blood pressure 162/67, pulse 78, temperature 98.1 F (36.7 C), temperature source Oral, resp. rate 18, height 5\' 9"  (1.753 m), weight 235 lb 14.4 oz (107.004 kg), SpO2 96 %. ECOG: 1 General appearance: alert and cooperative Head: Normocephalic, without obvious abnormality Neck: no adenopathy Lymph nodes: Cervical, supraclavicular, and axillary nodes normal. Heart:regular rate and rhythm, S1, S2 normal, no murmur, click, rub or gallop Lung:chest clear, no wheezing, rales, normal symmetric air entry Abdomin: soft, non-tender, without masses or organomegaly EXT:no erythema, induration, or nodules   Lab Results: Lab Results  Component Value Date   WBC 6.4 07/21/2013   HGB 11.9* 09/30/2013   HCT 37.6* 07/21/2013   MCV 94.9 07/21/2013   PLT 176 07/21/2013     Chemistry      Component Value Date/Time   NA 139 07/21/2013 0745   K 4.2 07/21/2013 0745   CL 103 07/21/2013 0745   CO2 28 07/21/2013 0745   BUN 25* 07/21/2013 0745   CREATININE 0.89 07/21/2013 0745      Component Value Date/Time   CALCIUM 9.5 07/21/2013 0745       Radiological Studies:  NUCLEAR MEDICINE WHOLE BODY BONE SCINTIGRAPHY  Technique: Whole body anterior and posterior images were obtained approximately 3 hours  after intravenous injection of radiopharmaceutical.  Radiopharmaceutical: 23.4 mCi of technetium 3m MDP  Comparison: 05/17/2009  Findings: There is linear increased radiotracer uptake within the mid thoracic spine at the approximate T8 level. The appearance is fairly typical for a compression fracture. This is  unchanged from the previous examination.  Heterogeneous radiotracer uptake within the lumbar spine and sacrum is unchanged from 05/17/2009. Likely degenerative in etiology.  The patient is status post right hip arthroplasty. This is new since the exam for May 05/17/2009.  Degenerative type changes involve both shoulders and both feet.  There is symmetric physiologic tracer uptake within both kidneys and urinary bladder.  IMPRESSION:  1. Stable exam. Increased uptake within the approximate T8 vertebra, lumbar spine and sacrum is unchanged from 823/10. Favor chronic degenerative type changes. 2. Status post right hip arthroplasty.   Impression and Plan:   81 year old gentleman with the following issues:  1. Prostate cancer diagnosed in 1998 after presenting with a Gleason score 9 and received definitive therapy with seed implants. He developed recurrent disease in 2014 and have been on hormone therapy since 2015. He had a reasonable response initially and his last PSA was up to 1.91 in June 2016 from 0.78 in March 2016. He is quite asymptomatic.   CT scan and bone scan obtained on 04/08/2059 were reviewed today. His bone scan shows stable exam with increased uptake at T8 vertebra and lumbar spine which is suggestive of metastatic disease or possible arthritis. CT scan of the abdomen and pelvis did not show any visceral metastasis.  Options of treatment were discussed again today. These would include observation and surveillance, second line hormonal therapy and systemic chemotherapy. I see no need for systemic chemotherapy at this time. Risks and benefits of Xtandi therapy was also reviewed. These complications include fatigue, GI complications, lower extremity edema, hypertension and rarely seizures. The benefit would be better long-term control of his PSA and prevention from further metastatic disease.  After discussing the risks and benefits is agreeable to proceed  for the time being. I will assess these complications after he has been on this medication for one month. I explained to him that a dose reduction might be necessary if he has poor tolerance.   2. Hormone therapy: I have recommended continuing that for the time being.  3. Follow-up: Will be in 4 weeks to assess for complications from this medication.   Zola Button, MD 8/2/20163:27 PM

## 2015-04-30 ENCOUNTER — Encounter: Payer: Self-pay | Admitting: Oncology

## 2015-04-30 NOTE — Progress Notes (Signed)
Per Helene Kelp at [facility], she has spoken with the patient and the copy of 150 is too much, so she will send application for asst to me. I called and left her a message my fax# to get to me

## 2015-04-30 NOTE — Progress Notes (Signed)
Per Helene Kelp.she said the patient called back to say he can pay the 150 copay, so she didn't send me an app for asst.

## 2015-05-25 ENCOUNTER — Other Ambulatory Visit: Payer: Self-pay | Admitting: *Deleted

## 2015-05-25 ENCOUNTER — Encounter: Payer: Self-pay | Admitting: *Deleted

## 2015-05-25 MED ORDER — ENZALUTAMIDE 40 MG PO CAPS: 160.0000 mg | ORAL_CAPSULE | Freq: Every day | ORAL | Status: DC

## 2015-06-02 ENCOUNTER — Ambulatory Visit (HOSPITAL_BASED_OUTPATIENT_CLINIC_OR_DEPARTMENT_OTHER): Payer: Medicare HMO | Admitting: Oncology

## 2015-06-02 ENCOUNTER — Telehealth: Payer: Self-pay | Admitting: Oncology

## 2015-06-02 ENCOUNTER — Other Ambulatory Visit (HOSPITAL_BASED_OUTPATIENT_CLINIC_OR_DEPARTMENT_OTHER): Payer: Medicare HMO

## 2015-06-02 ENCOUNTER — Other Ambulatory Visit: Payer: Self-pay | Admitting: *Deleted

## 2015-06-02 VITALS — BP 151/74 | HR 75 | Temp 98.5°F | Resp 18 | Ht 69.0 in | Wt 231.6 lb

## 2015-06-02 DIAGNOSIS — C61 Malignant neoplasm of prostate: Secondary | ICD-10-CM

## 2015-06-02 DIAGNOSIS — C7951 Secondary malignant neoplasm of bone: Secondary | ICD-10-CM

## 2015-06-02 LAB — COMPREHENSIVE METABOLIC PANEL (CC13)
ALT: 18 U/L (ref 0–55)
AST: 16 U/L (ref 5–34)
Albumin: 4.5 g/dL (ref 3.5–5.0)
Alkaline Phosphatase: 74 U/L (ref 40–150)
Anion Gap: 9 mEq/L (ref 3–11)
BUN: 20.3 mg/dL (ref 7.0–26.0)
CO2: 26 mEq/L (ref 22–29)
Calcium: 10.1 mg/dL (ref 8.4–10.4)
Chloride: 105 mEq/L (ref 98–109)
Creatinine: 1 mg/dL (ref 0.7–1.3)
EGFR: 68 mL/min/{1.73_m2} — AB (ref >90)
Glucose: 91 mg/dl (ref 70–140)
POTASSIUM: 4.8 meq/L (ref 3.5–5.1)
Sodium: 139 mEq/L (ref 136–145)
Total Bilirubin: 0.8 mg/dL (ref 0.20–1.20)
Total Protein: 7.3 g/dL (ref 6.4–8.3)

## 2015-06-02 LAB — CBC WITH DIFFERENTIAL/PLATELET
BASO%: 0.3 % (ref 0.0–2.0)
Basophils Absolute: 0 10*3/uL (ref 0.0–0.1)
EOS ABS: 0.4 10*3/uL (ref 0.0–0.5)
EOS%: 4.9 % (ref 0.0–7.0)
HCT: 39.4 % (ref 38.4–49.9)
HGB: 13.5 g/dL (ref 13.0–17.1)
LYMPH%: 26.8 % (ref 14.0–49.0)
MCH: 32.1 pg (ref 27.2–33.4)
MCHC: 34.3 g/dL (ref 32.0–36.0)
MCV: 93.8 fL (ref 79.3–98.0)
MONO#: 0.6 10*3/uL (ref 0.1–0.9)
MONO%: 8.2 % (ref 0.0–14.0)
NEUT%: 59.8 % (ref 39.0–75.0)
NEUTROS ABS: 4.3 10*3/uL (ref 1.5–6.5)
Platelets: 180 10*3/uL (ref 140–400)
RBC: 4.2 10*6/uL (ref 4.20–5.82)
RDW: 13.3 % (ref 11.0–14.6)
WBC: 7.2 10*3/uL (ref 4.0–10.3)
lymph#: 1.9 10*3/uL (ref 0.9–3.3)

## 2015-06-02 MED ORDER — ENZALUTAMIDE 40 MG PO CAPS: 160.0000 mg | ORAL_CAPSULE | Freq: Every day | ORAL | Status: DC

## 2015-06-02 NOTE — Telephone Encounter (Signed)
Gave patient avs report and appointments for October  °

## 2015-06-02 NOTE — Progress Notes (Signed)
Hematology and Oncology Follow Up Visit  [name] [name] [id number] [birth year] 81 y.o. 06/02/2015 2:37 PM [name],[name] [name], [name] Baltimore, MD   Principle Diagnosis: 81 year old gentleman diagnosed with prostate cancer in 1998 after presenting with a Gleason score of 9. 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   Prior Therapy: He did develop recurrent disease in October 2014 and required a channel TUR. He subsequently was started on firmagon which controlled his PSA reasonably well.  His most recently his PSA was up to 1.91 in June 2016 after a rise from 0.78 in March 2016. Before that was 0.53 in November 2015.   Current therapy:  Androgen deprivation. Administered at [facility] urology. Xtandi 160 mg daily started in August 2016.  Interim History: [name] is as today for a follow-up visit. Since the last visit, he started xtandi and reports no complications. He did report grade 1 fatigue but it is manageable. Does not report any nausea or lower extremity edema. He does not report any pathological fractures or bone pain. He continues to gain weight and his mobility is rather limited. He has not reported any constitutional symptoms. He has not reported any new urinary symptoms.  He does not report any headaches, blurry vision, syncope or seizures. He does not report any fevers, chills, sweats or weight loss. He is not reporting any chest pain, palpitation, orthopnea or leg edema. He does not report any cough or hemoptysis or hematemesis. He does not report any nausea, vomiting, abdominal pain, hematochezia, melanoma. He does report incontinence but no hematuria, dysuria, nocturia. He does not report any skeletal complaints. Remainder review of systems unremarkable.   Medications: I have reviewed the patient's current medications.  Current Outpatient Prescriptions  Medication Sig Dispense Refill  . levothyroxine (SYNTHROID, LEVOTHROID) 150 MCG tablet Take 150 mcg by mouth  daily before breakfast.    . meloxicam (MOBIC) 7.5 MG tablet Take 7.5 mg by mouth 2 (two) times daily.     . enzalutamide (XTANDI) 40 MG capsule Take 4 capsules (160 mg total) by mouth daily. 120 capsule 0   No current facility-administered medications for this visit.     Allergies: No Known Allergies  Past Medical History, Surgical history, Social history, and Family History were reviewed and updated.   Physical Exam: Blood pressure 151/74, pulse 75, temperature 98.5 F (36.9 C), temperature source Oral, resp. rate 18, height 5\' 9"  (1.753 m), weight 231 lb 9.6 oz (105.053 kg), SpO2 98 %. ECOG: 1 General appearance: alert and cooperative awake gentleman without distress. Head: Normocephalic, without obvious abnormality Neck: no adenopathy Lymph nodes: Cervical, supraclavicular, and axillary nodes normal. Heart:regular rate and rhythm, S1, S2 normal, no murmur, click, rub or gallop Lung:chest clear, no wheezing, rales, normal symmetric air entry no dullness to percussion. Abdomin: soft, non-tender, without masses or organomegaly shifting dullness or ascites EXT:no erythema, induration, or nodules   Lab Results: Lab Results  Component Value Date   WBC 7.2 06/02/2015   HGB 13.5 06/02/2015   HCT 39.4 06/02/2015   MCV 93.8 06/02/2015   PLT 180 06/02/2015     Chemistry      Component Value Date/Time   NA 139 07/21/2013 0745   K 4.2 07/21/2013 0745   CL 103 07/21/2013 0745   CO2 28 07/21/2013 0745   BUN 25* 07/21/2013 0745   CREATININE 0.89 07/21/2013 0745      Component Value Date/Time   CALCIUM 9.5 07/21/2013 0745        Impression  and Plan:   81 year old gentleman with the following issues:  1. Prostate cancer diagnosed in 1998 after presenting with a Gleason score 9 and received definitive therapy with seed implants. He developed recurrent disease in 2014 and have been on hormone therapy since 2015. He had a reasonable response initially and his last PSA was up  to 1.91 in June 2016 from 0.78 in March 2016. He is quite asymptomatic.   CT scan and bone scan obtained on 04/08/2015  Showed stable disease with increased uptake at T8 vertebra and lumbar spine which is suggestive of metastatic disease or possible arthritis. CT scan of the abdomen and pelvis did not show any visceral metastasis.  He is currently on Xtandi and have tolerated the first month well. The plan is to continue with the same dose and schedule without any medications. His PSA is currently pending and will be communicated to him with available.    2. Hormone therapy: I have recommended continuing that for the time being.  3. Follow-up: Will be in 4 weeks to assess for complications from this medication.   Zola Button, MD 9/7/20162:37 PM

## 2015-06-03 ENCOUNTER — Telehealth: Payer: Self-pay | Admitting: *Deleted

## 2015-06-03 LAB — PSA: PSA: 0.41 ng/mL (ref <=4.00)

## 2015-06-03 NOTE — Telephone Encounter (Signed)
-----   Message from Wyatt Portela, MD sent at 06/03/2015  8:16 AM EDT ----- Please call his PSA. Very low.

## 2015-06-03 NOTE — Telephone Encounter (Signed)
Spoke with patient, gave results of last psa.

## 2015-06-23 ENCOUNTER — Other Ambulatory Visit: Payer: Self-pay | Admitting: *Deleted

## 2015-06-23 DIAGNOSIS — C61 Malignant neoplasm of prostate: Secondary | ICD-10-CM

## 2015-06-23 MED ORDER — ENZALUTAMIDE 40 MG PO CAPS: 160.0000 mg | ORAL_CAPSULE | Freq: Every day | ORAL | Status: DC

## 2015-06-25 ENCOUNTER — Other Ambulatory Visit: Payer: Self-pay | Admitting: *Deleted

## 2015-06-25 MED ORDER — ENZALUTAMIDE 40 MG PO CAPS
160.0000 mg | ORAL_CAPSULE | Freq: Every day | ORAL | Status: DC
Start: 2015-06-25 — End: 2015-07-26

## 2015-06-25 NOTE — Telephone Encounter (Signed)
Patient calling to request a refill on Xtandi. Faxed to [facility].

## 2015-06-30 DIAGNOSIS — N200 Calculus of kidney: Secondary | ICD-10-CM | POA: Diagnosis not present

## 2015-06-30 DIAGNOSIS — C61 Malignant neoplasm of prostate: Secondary | ICD-10-CM | POA: Diagnosis not present

## 2015-06-30 DIAGNOSIS — R5383 Other fatigue: Secondary | ICD-10-CM | POA: Diagnosis not present

## 2015-06-30 DIAGNOSIS — N3941 Urge incontinence: Secondary | ICD-10-CM | POA: Diagnosis not present

## 2015-07-02 ENCOUNTER — Ambulatory Visit (HOSPITAL_BASED_OUTPATIENT_CLINIC_OR_DEPARTMENT_OTHER): Payer: Medicare HMO | Admitting: Oncology

## 2015-07-02 ENCOUNTER — Telehealth: Payer: Self-pay | Admitting: Oncology

## 2015-07-02 ENCOUNTER — Other Ambulatory Visit (HOSPITAL_BASED_OUTPATIENT_CLINIC_OR_DEPARTMENT_OTHER): Payer: Medicare HMO

## 2015-07-02 VITALS — BP 148/61 | HR 76 | Temp 98.4°F | Resp 20 | Ht 69.0 in | Wt 233.3 lb

## 2015-07-02 DIAGNOSIS — C61 Malignant neoplasm of prostate: Secondary | ICD-10-CM

## 2015-07-02 DIAGNOSIS — C7951 Secondary malignant neoplasm of bone: Secondary | ICD-10-CM

## 2015-07-02 LAB — CBC WITH DIFFERENTIAL/PLATELET
BASO%: 0.6 % (ref 0.0–2.0)
BASOS ABS: 0 10*3/uL (ref 0.0–0.1)
EOS%: 5.5 % (ref 0.0–7.0)
Eosinophils Absolute: 0.3 10*3/uL (ref 0.0–0.5)
HEMATOCRIT: 39.1 % (ref 38.4–49.9)
HGB: 12.9 g/dL — ABNORMAL LOW (ref 13.0–17.1)
LYMPH%: 27.2 % (ref 14.0–49.0)
MCH: 31.3 pg (ref 27.2–33.4)
MCHC: 32.9 g/dL (ref 32.0–36.0)
MCV: 95.1 fL (ref 79.3–98.0)
MONO#: 0.5 10*3/uL (ref 0.1–0.9)
MONO%: 9.1 % (ref 0.0–14.0)
NEUT#: 3.3 10*3/uL (ref 1.5–6.5)
NEUT%: 57.6 % (ref 39.0–75.0)
Platelets: 189 10*3/uL (ref 140–400)
RBC: 4.11 10*6/uL — AB (ref 4.20–5.82)
RDW: 14.2 % (ref 11.0–14.6)
WBC: 5.6 10*3/uL (ref 4.0–10.3)
lymph#: 1.5 10*3/uL (ref 0.9–3.3)

## 2015-07-02 LAB — COMPREHENSIVE METABOLIC PANEL (CC13)
ALT: 14 U/L (ref 0–55)
AST: 15 U/L (ref 5–34)
Albumin: 4.1 g/dL (ref 3.5–5.0)
Alkaline Phosphatase: 69 U/L (ref 40–150)
Anion Gap: 7 mEq/L (ref 3–11)
BUN: 25.8 mg/dL (ref 7.0–26.0)
CALCIUM: 9.8 mg/dL (ref 8.4–10.4)
CO2: 28 mEq/L (ref 22–29)
Chloride: 105 mEq/L (ref 98–109)
Creatinine: 1 mg/dL (ref 0.7–1.3)
EGFR: 70 mL/min/{1.73_m2} — ABNORMAL LOW (ref >90)
Glucose: 100 mg/dl (ref 70–140)
POTASSIUM: 4.4 meq/L (ref 3.5–5.1)
SODIUM: 141 meq/L (ref 136–145)
Total Bilirubin: 0.57 mg/dL (ref 0.20–1.20)
Total Protein: 6.9 g/dL (ref 6.4–8.3)

## 2015-07-02 NOTE — Progress Notes (Signed)
Hematology and Oncology Follow Up Visit  SIGURD PUGH [id number] 02-04-1932 81 y.o. 07/02/2015 12:50 PM [name],ROBERT [name], [name], MD   Principle Diagnosis: 81 year old gentleman diagnosed with prostate cancer in 1998 after presenting with a Gleason score of 9. 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   Prior Therapy: He did develop recurrent disease in October 2014 and required a channel TUR. He subsequently was started on firmagon which controlled his PSA reasonably well.  His most recently his PSA was up to 1.91 in June 2016 after a rise from 0.78 in March 2016. Before that was 0.53 in November 2015.   Current therapy:  Androgen deprivation. Administered at [facility]. Xtandi 160 mg daily started in August 2016.  Interim History: Mr. Coffin is as today for a follow-up visit. Since the last visit, he continues on [place]. He did report grade 2 fatigue which is interfering with his function at this time. He reports he is doing less at work and very slow and his mobility. He does not report any nausea or lower extremity edema. He does not report any pathological fractures or bone pain. He continues to gain weight and his mobility is rather limited. He has not reported any constitutional symptoms. He has not reported any new urinary symptoms.  He does not report any headaches, blurry vision, syncope or seizures. He does not report any fevers, chills, sweats or weight loss. He is not reporting any chest pain, palpitation, orthopnea or leg edema. He does not report any cough or hemoptysis or hematemesis. He does not report any nausea, vomiting, abdominal pain, hematochezia, melanoma. He does report incontinence but no hematuria, dysuria, nocturia. He does not report any skeletal complaints. Remainder review of systems unremarkable.   Medications: I have reviewed the patient's current medications.  Current Outpatient Prescriptions  Medication Sig Dispense Refill   . enzalutamide (XTANDI) 40 MG capsule Take 4 capsules (160 mg total) by mouth daily. 120 capsule 0  . levothyroxine (SYNTHROID, LEVOTHROID) 150 MCG tablet Take 150 mcg by mouth daily before breakfast.    . meloxicam (MOBIC) 7.5 MG tablet Take 7.5 mg by mouth 2 (two) times daily.      No current facility-administered medications for this visit.     Allergies: No Known Allergies  Past Medical History, Surgical history, Social history, and Family History were reviewed and updated.   Physical Exam: Blood pressure 148/61, pulse 76, temperature 98.4 F (36.9 C), temperature source Oral, resp. rate 20, height 5\' 9"  (1.753 m), weight 233 lb 4.8 oz (105.824 kg), SpO2 97 %. ECOG: 1 General appearance: alert and cooperative well-appearing gentleman without distress. Head: Normocephalic, without obvious abnormality no oral ulcers or lesions. Neck: no adenopathy Lymph nodes: Cervical, supraclavicular, and axillary nodes normal. Heart:regular rate and rhythm, S1, S2 normal, no murmur, click, rub or gallop Lung:chest clear, no wheezing, rales, normal symmetric air entry no dullness to percussion. Abdomin: soft, non-tender, without masses or organomegaly  EXT:no erythema, induration, or nodules   Lab Results: Lab Results  Component Value Date   WBC 5.6 07/02/2015   HGB 12.9* 07/02/2015   HCT 39.1 07/02/2015   MCV 95.1 07/02/2015   PLT 189 07/02/2015     Chemistry      Component Value Date/Time   NA 139 06/02/2015 1352   NA 139 07/21/2013 0745   K 4.8 06/02/2015 1352   K 4.2 07/21/2013 0745   CL 103 07/21/2013 0745   CO2 26 06/02/2015 1352   CO2  28 07/21/2013 0745   BUN 20.3 06/02/2015 1352   BUN 25* 07/21/2013 0745   CREATININE 1.0 06/02/2015 1352   CREATININE 0.89 07/21/2013 0745      Component Value Date/Time   CALCIUM 10.1 06/02/2015 1352   CALCIUM 9.5 07/21/2013 0745   ALKPHOS 74 06/02/2015 1352   AST 16 06/02/2015 1352   ALT 18 06/02/2015 1352   BILITOT 0.80 06/02/2015  1352     Results for VINTON, LAYSON (MRN [id number]) as of 07/02/2015 12:40  Ref. Range 06/02/2015 13:52  PSA Latest Ref Range: <=4.00 ng/mL 0.41     Impression and Plan:   81 year old gentleman with the following issues:  1. Prostate cancer diagnosed in 1998 after presenting with a Gleason score 9 and received definitive therapy with seed implants. He developed recurrent disease in 2014 and have been on hormone therapy since 2015. He had a reasonable response initially and his last PSA was up to 1.91 in June 2016 from 0.78 in March 2016. He is quite asymptomatic.   CT scan and bone scan obtained on 04/08/2015  Showed stable disease with increased uptake at T8 vertebra and lumbar spine which is suggestive of metastatic disease or possible arthritis. CT scan of the abdomen and pelvis did not show any visceral metastasis.  He is currently on Xtandi and had an excellent response with a PSA dropped to 0.23. He is reporting worsening fatigue that is interfering with his function at this time. I have recommended reducing the dose by 50% down to 80 mg daily for better tolerance.   2. Hormone therapy: I have recommended continuing that for the time being. This will need to be continued indefinitely.  3. Fatigue and decline in his performance status: Likely related to xtandi I anticipate this will improve with the dose reduction.  4. Liver function and kidney function surveillance: His chemistries were personally reviewed and all within normal range.  5. Follow-up: Will be in 8 weeks to assess for complications from this medication.   Zola Button, MD 10/7/201612:50 PM

## 2015-07-02 NOTE — Telephone Encounter (Signed)
Gave and printed appts ched and avs for pt for DEC  °

## 2015-07-05 ENCOUNTER — Telehealth: Payer: Self-pay | Admitting: *Deleted

## 2015-07-05 LAB — PSA: PSA: 0.21 ng/mL (ref <=4.00)

## 2015-07-05 NOTE — Telephone Encounter (Signed)
-----   Message from Wyatt Portela, MD sent at 07/05/2015  8:54 AM EDT ----- Please call his PSA

## 2015-07-05 NOTE — Telephone Encounter (Signed)
Per Dr. Alen Blew, I informed patient that his PSA went from 0.41 to 0.21. Patient verbalized understanding.

## 2015-07-26 ENCOUNTER — Telehealth: Payer: Self-pay | Admitting: *Deleted

## 2015-07-26 ENCOUNTER — Other Ambulatory Visit: Payer: Self-pay | Admitting: *Deleted

## 2015-07-26 ENCOUNTER — Telehealth: Payer: Self-pay | Admitting: Oncology

## 2015-07-26 DIAGNOSIS — C61 Malignant neoplasm of prostate: Secondary | ICD-10-CM

## 2015-07-26 MED ORDER — ENZALUTAMIDE 40 MG PO CAPS: 160.0000 mg | ORAL_CAPSULE | Freq: Every day | ORAL | Status: DC

## 2015-07-26 NOTE — Telephone Encounter (Signed)
Aware of 11/1 appointments

## 2015-07-26 NOTE — Telephone Encounter (Signed)
TC from pt's wife -she states that her husband has been having chills on and off since last Monday. They come and go-usually in the morning. Denies fever or any other symptom except body aches.  Pt has been on Xtandi for about a month and a half but stopped the [place] 2 days ago because he felt his symptoms were d/t the [place].  Denies headache, cough, nausea, vomiting, diarrhea. Appetite has been good according to wife.  Wife would like him to be seen .  Patient does not want to go to ED.  Will have labs done and seen in [facility] tomorrow. Wife to check pt's temp in the meantime and call back if >100.5

## 2015-07-27 ENCOUNTER — Ambulatory Visit (HOSPITAL_BASED_OUTPATIENT_CLINIC_OR_DEPARTMENT_OTHER): Payer: Medicare HMO

## 2015-07-27 ENCOUNTER — Ambulatory Visit (HOSPITAL_BASED_OUTPATIENT_CLINIC_OR_DEPARTMENT_OTHER): Payer: Medicare HMO | Admitting: Nurse Practitioner

## 2015-07-27 VITALS — BP 94/75 | HR 96 | Temp 98.6°F | Resp 20 | Ht 69.0 in | Wt 229.7 lb

## 2015-07-27 DIAGNOSIS — E860 Dehydration: Secondary | ICD-10-CM

## 2015-07-27 DIAGNOSIS — E46 Unspecified protein-calorie malnutrition: Secondary | ICD-10-CM

## 2015-07-27 DIAGNOSIS — G893 Neoplasm related pain (acute) (chronic): Secondary | ICD-10-CM

## 2015-07-27 DIAGNOSIS — C61 Malignant neoplasm of prostate: Secondary | ICD-10-CM

## 2015-07-27 DIAGNOSIS — N135 Crossing vessel and stricture of ureter without hydronephrosis: Secondary | ICD-10-CM

## 2015-07-27 DIAGNOSIS — E8809 Other disorders of plasma-protein metabolism, not elsewhere classified: Secondary | ICD-10-CM

## 2015-07-27 DIAGNOSIS — I10 Essential (primary) hypertension: Secondary | ICD-10-CM

## 2015-07-27 LAB — CBC WITH DIFFERENTIAL/PLATELET
BASO%: 0.1 % (ref 0.0–2.0)
BASOS ABS: 0 10*3/uL (ref 0.0–0.1)
EOS%: 0.7 % (ref 0.0–7.0)
Eosinophils Absolute: 0.1 10*3/uL (ref 0.0–0.5)
HCT: 37.5 % — ABNORMAL LOW (ref 38.4–49.9)
HGB: 12.3 g/dL — ABNORMAL LOW (ref 13.0–17.1)
LYMPH%: 11.9 % — AB (ref 14.0–49.0)
MCH: 31.5 pg (ref 27.2–33.4)
MCHC: 32.8 g/dL (ref 32.0–36.0)
MCV: 95.9 fL (ref 79.3–98.0)
MONO#: 0.9 10*3/uL (ref 0.1–0.9)
MONO%: 8.4 % (ref 0.0–14.0)
NEUT%: 78.9 % — ABNORMAL HIGH (ref 39.0–75.0)
NEUTROS ABS: 7.9 10*3/uL — AB (ref 1.5–6.5)
Platelets: 173 10*3/uL (ref 140–400)
RBC: 3.91 10*6/uL — AB (ref 4.20–5.82)
RDW: 13.8 % (ref 11.0–14.6)
WBC: 10.1 10*3/uL (ref 4.0–10.3)
lymph#: 1.2 10*3/uL (ref 0.9–3.3)

## 2015-07-27 LAB — COMPREHENSIVE METABOLIC PANEL (CC13)
ALBUMIN: 3 g/dL — AB (ref 3.5–5.0)
ALK PHOS: 71 U/L (ref 40–150)
ALT: 18 U/L (ref 0–55)
ANION GAP: 11 meq/L (ref 3–11)
AST: 18 U/L (ref 5–34)
BILIRUBIN TOTAL: 0.56 mg/dL (ref 0.20–1.20)
BUN: 49.1 mg/dL — ABNORMAL HIGH (ref 7.0–26.0)
CALCIUM: 9.9 mg/dL (ref 8.4–10.4)
CO2: 26 mEq/L (ref 22–29)
Chloride: 104 mEq/L (ref 98–109)
Creatinine: 2.5 mg/dL — ABNORMAL HIGH (ref 0.7–1.3)
EGFR: 23 mL/min/{1.73_m2} — AB (ref >90)
Glucose: 118 mg/dl (ref 70–140)
POTASSIUM: 4.1 meq/L (ref 3.5–5.1)
SODIUM: 140 meq/L (ref 136–145)
Total Protein: 7.4 g/dL (ref 6.4–8.3)

## 2015-07-27 MED ORDER — SODIUM CHLORIDE 0.9 % IV SOLN
INTRAVENOUS | Status: AC
  Administered 2015-07-27: 14:00:00 via INTRAVENOUS

## 2015-07-27 NOTE — Patient Instructions (Signed)

## 2015-07-28 ENCOUNTER — Encounter: Payer: Self-pay | Admitting: Nurse Practitioner

## 2015-07-28 DIAGNOSIS — I10 Essential (primary) hypertension: Secondary | ICD-10-CM | POA: Insufficient documentation

## 2015-07-28 DIAGNOSIS — E860 Dehydration: Secondary | ICD-10-CM | POA: Insufficient documentation

## 2015-07-28 DIAGNOSIS — E8809 Other disorders of plasma-protein metabolism, not elsewhere classified: Secondary | ICD-10-CM | POA: Insufficient documentation

## 2015-07-28 DIAGNOSIS — E46 Unspecified protein-calorie malnutrition: Secondary | ICD-10-CM | POA: Insufficient documentation

## 2015-07-28 DIAGNOSIS — G893 Neoplasm related pain (acute) (chronic): Secondary | ICD-10-CM | POA: Insufficient documentation

## 2015-07-28 NOTE — Assessment & Plan Note (Signed)
Patient states that he began taking extending approximately 6 weeks ago; and began developing generalized body aches approximately one week later.  He states that the body aches have become so severe; that he discontinued the extending this past Sunday, 07/25/2015.  He denies any URI, or UTI symptoms.  He denies any recent fevers or chills.  On exam.-Patient appears fatigued and slightly weak; but nontoxic.  Patient has full range of motion is ambulating with no difficulty.  Patient appears mildly dehydrated; and creatinine was elevated to 2.5 today.  Discussed option of receiving IV fluid rehydration while at the cancer center; the patient refused.  He states that he is taking plenty of fluids at home.  After reviewing all findings with Dr. Renne Crigler was advised to continue holding the extending for 1 more week.  Patient will return in one week on 08/03/2015 for labs and a follow-up visit.  He knows to call in the interim with any new worries or concerns.

## 2015-07-28 NOTE — Assessment & Plan Note (Addendum)
Patient initiated Xtandi oral therapy.  Approximate 6 weeks ago for treatment of his prostate cancer.  He states that he began experiencing significant generalized myalgias.  Approximate one week later.  He states that the body aches have become so severe; that he discontinued the extent the on Sunday, 07/25/2015.  Blood counts obtained today revealed WBC of 10.1, ANC 7.9, hemoglobin 12.3, and platelet count 173.  Patient will continue to hold the [facility] for one week; and will return next week on 08/03/2015 for labs and a follow-up visit.  He knows to call in the interim with any new worries or concerns.

## 2015-07-28 NOTE — Progress Notes (Signed)
SYMPTOM MANAGEMENT CLINIC   HPI: [name] [name] 81 y.o. male diagnosed with prostate cancer.  Currently undergoing Xtandi oral therapy.   Patient states that he began taking extending approximately 6 weeks ago; and began developing generalized body aches approximately one week later.  He states that the body aches have become so severe; that he discontinued the extending this past Sunday, 07/25/2015.  He denies any URI, or UTI symptoms.  He denies any recent fevers or chills.  On exam.-Patient appears fatigued and slightly weak; but nontoxic.  Patient has full range of motion is ambulating with no difficulty.  Patient appears mildly dehydrated; and creatinine was elevated to 2.5 today.  Discussed option of receiving IV fluid rehydration while at the cancer center; the patient refused.  He states that he is taking plenty of fluids at home.  After reviewing all findings with Dr. Renne Crigler was advised to continue holding the extending for 1 more week.  Patient will return in one week on 08/03/2015 for labs and a follow-up visit.  He knows to call in the interim with any new worries or concerns.  HPI  ROS  Past Medical History  Diagnosis Date  . Left ureteral calculus   . History of prostate cancer     S/P RADIOACTIVE SEED IMPLANTS 1997  . Arthritis   . Hypothyroidism   . History of kidney stones   . Organic impotence   . History of urinary tract obstruction     BNC  . Chronic left shoulder pain     AND DECREASE ROM--  END-STAGE OA  . OA (osteoarthritis)   . Wears glasses   . Urinary incontinence   . History of cardiac murmur     ASYMPTOMATIC  . Allergic rhinitis   . Incomplete RBBB     Past Surgical History  Procedure Laterality Date  . Cysto/  unroofing right ureterocele and stone manipulation  02-23-2011  . Total hip arthroplasty Right 06-29-2009  . Orif left ankle fx  01-15-2002  . Radioactive prostate seed implants  1997  . Extracorporeal shock wave  lithotripsy    . Cystoscopy with ureteroscopy, stone basketry and stent placement Left 07/08/2013    Procedure: LEFT  URETEROSCOPY,;  Surgeon: [name], MD;  Location: [facility];  Service: Urology;  Laterality: Left;  . Transurethral resection of bladder neck N/A 07/08/2013    Procedure: TRANSURETHRAL RESECTION OF BLADDER NECK;  Surgeon: [name], MD;  Location: [facility];  Service: Urology;  Laterality: N/A;  resection of baldder neck  . Percutaneous nephrostomy Left 07/16/13    removed 07/21/13  . Ureteral stent placement Left 07/21/13  . Eye surgery Bilateral 09/22/13    cataract w/lens  . Cystoscopy with ureteroscopy and stent placement Left 09/30/2013    Procedure: LEFT CYSTOSCOPY WITH URETEROSCOPY, STONE EXTRACTION  AND STENT REPLACEMENT;  Surgeon: [name], MD;  Location: [facility];  Service: Urology;  Laterality: Left;  . Holmium laser application Left 09/30/1094    Procedure: HOLMIUM LASER APPLICATION;  Surgeon: [name], MD;  Location: [facility];  Service: Urology;  Laterality: Left;    has Ureteral stone; Extrinsic ureteral obstruction; Prostate cancer ([place]); Cancer associated pain; Dehydration; Hypoalbuminemia due to protein-calorie malnutrition ([place]); and Hypertension on his problem list.    has No Known Allergies.    Medication List       This list is accurate as of: 07/27/15 11:59 PM.  Always use your most recent  med list.               acetaminophen 325 MG tablet  Commonly known as:  TYLENOL  Take 650 mg by mouth every 6 (six) hours as needed.     enzalutamide 40 MG capsule  Commonly known as:  XTANDI  Take 4 capsules (160 mg total) by mouth daily.     levothyroxine 150 MCG tablet  Commonly known as:  SYNTHROID, LEVOTHROID  Take 150 mcg by mouth daily before breakfast.     meloxicam 7.5 MG tablet  Commonly known as:  MOBIC  Take 7.5 mg by mouth 2 (two) times daily.         PHYSICAL  EXAMINATION  Oncology Vitals 07/27/2015 07/27/2015 07/27/2015 07/27/2015 07/27/2015 07/27/2015 07/02/2015  Height - - - - - 175 cm 175 cm  Weight - - - - - 104.191 kg 105.824 kg  Weight (lbs) - - - - - 229 lbs 11 oz 233 lbs 5 oz  BMI (kg/m2) - - - - - 33.92 kg/m2 34.45 kg/m2  Temp - - - - - 98.6 98.4  Pulse 82 80 74 96 84 63 76  Resp - 18 18 - - 20 20  SpO2 - 95 95 93 - 93 97  BSA (m2) - - - - - 2.25 m2 2.27 m2   BP Readings from Last 3 Encounters:  07/27/15 118/84  07/27/15 94/75  07/02/15 148/61    Physical Exam  Constitutional: He is oriented to person, place, and time.  Patient appears fatigued, slightly weak, and chronically ill.  HENT:  Head: Normocephalic and atraumatic.  Mouth/Throat: Oropharynx is clear and moist.  Eyes: Conjunctivae and EOM are normal. Pupils are equal, round, and reactive to light. Right eye exhibits no discharge. Left eye exhibits no discharge. No scleral icterus.  Neck: Normal range of motion. Neck supple. No JVD present. No tracheal deviation present. No thyromegaly present.  Cardiovascular: Normal rate, regular rhythm, normal heart sounds and intact distal pulses.   Pulmonary/Chest: Effort normal and breath sounds normal. No respiratory distress. He has no wheezes. He has no rales. He exhibits no tenderness.  Abdominal: Soft. Bowel sounds are normal. He exhibits no distension and no mass. There is no tenderness. There is no rebound and no guarding.  Musculoskeletal: Normal range of motion. He exhibits no edema or tenderness.  Lymphadenopathy:    He has no cervical adenopathy.  Neurological: He is alert and oriented to person, place, and time.  Skin: Skin is warm and dry. No rash noted. No erythema. There is pallor.  Psychiatric: Affect normal.  Nursing note and vitals reviewed.   LABORATORY DATA:. Appointment on 07/27/2015  Component Date Value Ref Range Status  . WBC 07/27/2015 10.1  4.0 - 10.3 10e3/uL Final  . NEUT# 07/27/2015 7.9* 1.5 - 6.5  10e3/uL Final  . HGB 07/27/2015 12.3* 13.0 - 17.1 g/dL Final  . HCT 07/27/2015 37.5* 38.4 - 49.9 % Final  . Platelets 07/27/2015 173  140 - 400 10e3/uL Final  . MCV 07/27/2015 95.9  79.3 - 98.0 fL Final  . MCH 07/27/2015 31.5  27.2 - 33.4 pg Final  . MCHC 07/27/2015 32.8  32.0 - 36.0 g/dL Final  . RBC 07/27/2015 3.91* 4.20 - 5.82 10e6/uL Final  . RDW 07/27/2015 13.8  11.0 - 14.6 % Final  . lymph# 07/27/2015 1.2  0.9 - 3.3 10e3/uL Final  . MONO# 07/27/2015 0.9  0.1 - 0.9 10e3/uL Final  . Eosinophils Absolute 07/27/2015 0.1  0.0 - 0.5 10e3/uL Final  . Basophils Absolute 07/27/2015 0.0  0.0 - 0.1 10e3/uL Final  . NEUT% 07/27/2015 78.9* 39.0 - 75.0 % Final  . LYMPH% 07/27/2015 11.9* 14.0 - 49.0 % Final  . MONO% 07/27/2015 8.4  0.0 - 14.0 % Final  . EOS% 07/27/2015 0.7  0.0 - 7.0 % Final  . BASO% 07/27/2015 0.1  0.0 - 2.0 % Final  . Sodium 07/27/2015 140  136 - 145 mEq/L Final  . Potassium 07/27/2015 4.1  3.5 - 5.1 mEq/L Final  . Chloride 07/27/2015 104  98 - 109 mEq/L Final  . CO2 07/27/2015 26  22 - 29 mEq/L Final  . Glucose 07/27/2015 118  70 - 140 mg/dl Final   Glucose reference range is for nonfasting patients. Fasting glucose reference range is 70- 100.  Marland Kitchen BUN 07/27/2015 49.1* 7.0 - 26.0 mg/dL Final  . Creatinine 07/27/2015 2.5* 0.7 - 1.3 mg/dL Final  . Total Bilirubin 07/27/2015 0.56  0.20 - 1.20 mg/dL Final  . Alkaline Phosphatase 07/27/2015 71  40 - 150 U/L Final  . AST 07/27/2015 18  5 - 34 U/L Final  . ALT 07/27/2015 18  0 - 55 U/L Final  . Total Protein 07/27/2015 7.4  6.4 - 8.3 g/dL Final  . Albumin 07/27/2015 3.0* 3.5 - 5.0 g/dL Final  . Calcium 07/27/2015 9.9  8.4 - 10.4 mg/dL Final  . Anion Gap 07/27/2015 11  3 - 11 mEq/L Final  . EGFR 07/27/2015 23* >90 ml/min/1.73 m2 Final   eGFR is calculated using the CKD-EPI Creatinine Equation (2009)     RADIOGRAPHIC STUDIES: No results found.  ASSESSMENT/PLAN:    Cancer associated pain Patient states that he began taking  extending approximately 6 weeks ago; and began developing generalized body aches approximately one week later.  He states that the body aches have become so severe; that he discontinued the extending this past Sunday, 07/25/2015.  He denies any URI, or UTI symptoms.  He denies any recent fevers or chills.  On exam.-Patient appears fatigued and slightly weak; but nontoxic.  Patient has full range of motion is ambulating with no difficulty.  Patient appears mildly dehydrated; and creatinine was elevated to 2.5 today.  Discussed option of receiving IV fluid rehydration while at the cancer center; the patient refused.  He states that he is taking plenty of fluids at home.  After reviewing all findings with Dr. Renne Crigler was advised to continue holding the extending for 1 more week.  Patient will return in one week on 08/03/2015 for labs and a follow-up visit.  He knows to call in the interim with any new worries or concerns.    Dehydration Patient is complaining of some generalized myalgias since initiating his extending oral therapy.  He appears slightly dehydrated; and creatinine has increased from 1.0 up to 2.5.  However, patient states that he has been drinking plenty of fluids; refuses IV fluid rehydration.  He was encouraged to push even more fluids at home.  Hypertension Patient presented to the [place] with a very elevated blood pressure.  Patient states that he has no history of hypertension in the past; and is not taking any blood pressure medications.  Recheck of patient's blood pressure in the right arm was 148/101.  Patient appears in no acute distress.  Advised patient and his wife to recheck his blood pressure once he arrives back home; and to call/return to go directly to the emergency department for any continued/persistent hypertension or  other new symptoms whatsoever.  Hypoalbuminemia due to protein-calorie malnutrition ([place]) Albumen has decreased from 4.1 down to 3.0.   Patient was encouraged to push protein in his diet is much as possible.  Prostate cancer [facility]) Patient initiated Xtandi oral therapy.  Approximate 6 weeks ago for treatment of his prostate cancer.  He states that he began experiencing significant generalized myalgias.  Approximate one week later.  He states that the body aches have become so severe; that he discontinued the extent the on Sunday, 07/25/2015.  Blood counts obtained today revealed WBC of 10.1, ANC 7.9, hemoglobin 12.3, and platelet count 173.  Patient will continue to hold the [facility] for one week; and will return next week on 08/03/2015 for labs and a follow-up visit.  He knows to call in the interim with any new worries or concerns.  Patient stated understanding of all instructions; and was in agreement with this plan of care. The patient knows to call the clinic with any problems, questions or concerns.   Review/collaboration with Dr. Alen Blew regarding all aspects of patient's visit today.   Total time spent with patient was 25 minutes;  with greater than 75 percent of that time spent in face to face counseling regarding patient's symptoms,  and coordination of care and follow up.  Disclaimer:This dictation was prepared with Dragon/digital dictation along with [employer]. Any transcriptional errors that result from this process are unintentional.  Drue Second, NP 07/28/2015

## 2015-07-28 NOTE — Assessment & Plan Note (Signed)
Patient presented to the [place] with a very elevated blood pressure.  Patient states that he has no history of hypertension in the past; and is not taking any blood pressure medications.  Recheck of patient's blood pressure in the right arm was 148/101.  Patient appears in no acute distress.  Advised patient and his wife to recheck his blood pressure once he arrives back home; and to call/return to go directly to the emergency department for any continued/persistent hypertension or other new symptoms whatsoever.

## 2015-07-28 NOTE — Assessment & Plan Note (Signed)
Albumen has decreased from 4.1 down to 3.0.  Patient was encouraged to push protein in his diet is much as possible.

## 2015-07-28 NOTE — Assessment & Plan Note (Signed)
Patient is complaining of some generalized myalgias since initiating his extending oral therapy.  He appears slightly dehydrated; and creatinine has increased from 1.0 up to 2.5.  However, patient states that he has been drinking plenty of fluids; refuses IV fluid rehydration.  He was encouraged to push even more fluids at home.

## 2015-07-29 ENCOUNTER — Telehealth: Payer: Self-pay | Admitting: Oncology

## 2015-07-29 NOTE — Telephone Encounter (Signed)
Aware of lab and follow  With cindy as drshadad is booked   [place]

## 2015-07-30 ENCOUNTER — Other Ambulatory Visit: Payer: Self-pay | Admitting: Nurse Practitioner

## 2015-08-03 ENCOUNTER — Ambulatory Visit (HOSPITAL_BASED_OUTPATIENT_CLINIC_OR_DEPARTMENT_OTHER): Payer: Medicare HMO | Admitting: Nurse Practitioner

## 2015-08-03 ENCOUNTER — Other Ambulatory Visit (HOSPITAL_BASED_OUTPATIENT_CLINIC_OR_DEPARTMENT_OTHER): Payer: Medicare HMO

## 2015-08-03 VITALS — BP 175/72 | HR 68 | Temp 98.6°F | Resp 20 | Ht 69.0 in | Wt 229.8 lb

## 2015-08-03 DIAGNOSIS — E46 Unspecified protein-calorie malnutrition: Secondary | ICD-10-CM

## 2015-08-03 DIAGNOSIS — B009 Herpesviral infection, unspecified: Secondary | ICD-10-CM | POA: Diagnosis not present

## 2015-08-03 DIAGNOSIS — I10 Essential (primary) hypertension: Secondary | ICD-10-CM

## 2015-08-03 DIAGNOSIS — N135 Crossing vessel and stricture of ureter without hydronephrosis: Secondary | ICD-10-CM

## 2015-08-03 DIAGNOSIS — E8809 Other disorders of plasma-protein metabolism, not elsewhere classified: Secondary | ICD-10-CM

## 2015-08-03 DIAGNOSIS — C61 Malignant neoplasm of prostate: Secondary | ICD-10-CM | POA: Diagnosis not present

## 2015-08-03 LAB — TECHNOLOGIST REVIEW

## 2015-08-03 LAB — CBC WITH DIFFERENTIAL/PLATELET
BASO%: 0.6 % (ref 0.0–2.0)
BASOS ABS: 0 10*3/uL (ref 0.0–0.1)
EOS ABS: 0.2 10*3/uL (ref 0.0–0.5)
EOS%: 1.9 % (ref 0.0–7.0)
HEMATOCRIT: 31.9 % — AB (ref 38.4–49.9)
HEMOGLOBIN: 10.7 g/dL — AB (ref 13.0–17.1)
LYMPH%: 16 % (ref 14.0–49.0)
MCH: 31.8 pg (ref 27.2–33.4)
MCHC: 33.5 g/dL (ref 32.0–36.0)
MCV: 94.9 fL (ref 79.3–98.0)
MONO#: 0.5 10*3/uL (ref 0.1–0.9)
MONO%: 6.4 % (ref 0.0–14.0)
NEUT#: 6.4 10*3/uL (ref 1.5–6.5)
NEUT%: 75.1 % — AB (ref 39.0–75.0)
Platelets: 351 10*3/uL (ref 140–400)
RBC: 3.36 10*6/uL — ABNORMAL LOW (ref 4.20–5.82)
RDW: 14.3 % (ref 11.0–14.6)
WBC: 8.5 10*3/uL (ref 4.0–10.3)
lymph#: 1.4 10*3/uL (ref 0.9–3.3)

## 2015-08-03 LAB — COMPREHENSIVE METABOLIC PANEL (CC13)
ALT: 18 U/L (ref 0–55)
ANION GAP: 8 meq/L (ref 3–11)
AST: 14 U/L (ref 5–34)
Albumin: 3.1 g/dL — ABNORMAL LOW (ref 3.5–5.0)
Alkaline Phosphatase: 66 U/L (ref 40–150)
BILIRUBIN TOTAL: 0.3 mg/dL (ref 0.20–1.20)
BUN: 20.6 mg/dL (ref 7.0–26.0)
CALCIUM: 9.3 mg/dL (ref 8.4–10.4)
CO2: 26 meq/L (ref 22–29)
Chloride: 110 mEq/L — ABNORMAL HIGH (ref 98–109)
Creatinine: 0.9 mg/dL (ref 0.7–1.3)
EGFR: 75 mL/min/{1.73_m2} — AB (ref >90)
Glucose: 97 mg/dl (ref 70–140)
Potassium: 4.3 mEq/L (ref 3.5–5.1)
Sodium: 143 mEq/L (ref 136–145)
Total Protein: 7.1 g/dL (ref 6.4–8.3)

## 2015-08-03 MED ORDER — VALACYCLOVIR HCL 1 G PO TABS: 1000.0000 mg | ORAL_TABLET | Freq: Three times a day (TID) | ORAL | Status: DC

## 2015-08-04 ENCOUNTER — Encounter: Payer: Self-pay | Admitting: Nurse Practitioner

## 2015-08-04 DIAGNOSIS — B009 Herpesviral infection, unspecified: Secondary | ICD-10-CM | POA: Insufficient documentation

## 2015-08-04 NOTE — Progress Notes (Signed)
SYMPTOM MANAGEMENT CLINIC   HPI: [name] [name] 81 y.o. male diagnosed with prostate cancer.  Currently undergoing Xtandi oral therapy.   Patient initiated Xtandi oral therapy approximately  7 weeks ago for treatment of his prostate cancer.  He states that he began experiencing significant generalized myalgias.  Approximate one week later he states that the body aches had become so severe; that he discontinued the Xtandi on Sunday, 07/25/2015.  Blood counts obtained today reveal a WBC of 8.5, ANC 6.4, hemoglobin 10.7, platelet count 351.  Patient states that he has significantly improved within this past week.  He no longer has the severe generalized myalgias; and is ambulating with only the assistance of a cane now.  He is both drinking and eating much better as well.  He denies any recent fevers or chills.  Patient is also noted a significant increase in amount of herpetic cold sores to his lips recently.Marland Kitchen  HPI  ROS  Past Medical History  Diagnosis Date  . Left ureteral calculus   . History of prostate cancer     S/P RADIOACTIVE SEED IMPLANTS 1997  . Arthritis   . Hypothyroidism   . History of kidney stones   . Organic impotence   . History of urinary tract obstruction     BNC  . Chronic left shoulder pain     AND DECREASE ROM--  END-STAGE OA  . OA (osteoarthritis)   . Wears glasses   . Urinary incontinence   . History of cardiac murmur     ASYMPTOMATIC  . Allergic rhinitis   . Incomplete RBBB     Past Surgical History  Procedure Laterality Date  . Cysto/  unroofing right ureterocele and stone manipulation  02-23-2011  . Total hip arthroplasty Right 06-29-2009  . Orif left ankle fx  01-15-2002  . Radioactive prostate seed implants  1997  . Extracorporeal shock wave lithotripsy    . Cystoscopy with ureteroscopy, stone basketry and stent placement Left 07/08/2013    Procedure: LEFT  URETEROSCOPY,;  Surgeon: [name], MD;  Location: [facility];   Service: Urology;  Laterality: Left;  . Transurethral resection of bladder neck N/A 07/08/2013    Procedure: TRANSURETHRAL RESECTION OF BLADDER NECK;  Surgeon: [name], MD;  Location: [facility];  Service: Urology;  Laterality: N/A;  resection of baldder neck  . Percutaneous nephrostomy Left 07/16/13    removed 07/21/13  . Ureteral stent placement Left 07/21/13  . Eye surgery Bilateral 09/22/13    cataract w/lens  . Cystoscopy with ureteroscopy and stent placement Left 09/30/2013    Procedure: LEFT CYSTOSCOPY WITH URETEROSCOPY, STONE EXTRACTION  AND STENT REPLACEMENT;  Surgeon: [name], MD;  Location: [facility];  Service: Urology;  Laterality: Left;  . Holmium laser application Left 12/29/2701    Procedure: HOLMIUM LASER APPLICATION;  Surgeon: [name], MD;  Location: [facility];  Service: Urology;  Laterality: Left;    has Ureteral stone; Extrinsic ureteral obstruction; Prostate cancer ([place]); Cancer associated pain; Dehydration; Hypoalbuminemia due to protein-calorie malnutrition ([place]); Hypertension; and Herpes simplex on his problem list.    has No Known Allergies.    Medication List       This list is accurate as of: 08/03/15 11:59 PM.  Always use your most recent med list.               acetaminophen 325 MG tablet  Commonly known as:  TYLENOL  Take 650 mg  by mouth every 6 (six) hours as needed.     enzalutamide 40 MG capsule  Commonly known as:  XTANDI  Take 4 capsules (160 mg total) by mouth daily.     levothyroxine 150 MCG tablet  Commonly known as:  SYNTHROID, LEVOTHROID  Take 150 mcg by mouth daily before breakfast.     meloxicam 7.5 MG tablet  Commonly known as:  MOBIC  Take 7.5 mg by mouth 2 (two) times daily.     valACYclovir 1000 MG tablet  Commonly known as:  VALTREX  Take 1 tablet (1,000 mg total) by mouth 3 (three) times daily.         PHYSICAL EXAMINATION  Oncology Vitals 08/03/2015 07/27/2015  07/27/2015 07/27/2015 07/27/2015 07/27/2015 07/27/2015  Height 175 cm - - - - - 175 cm  Weight 104.237 kg - - - - - 104.191 kg  Weight (lbs) 229 lbs 13 oz - - - - - 229 lbs 11 oz  BMI (kg/m2) 33.94 kg/m2 - - - - - 33.92 kg/m2  Temp 98.6 - - - - - 98.6  Pulse 68 82 80 74 96 84 63  Resp 20 - 18 18 - - 20  SpO2 96 - 95 95 93 - 93  BSA (m2) 2.25 m2 - - - - - 2.25 m2   BP Readings from Last 3 Encounters:  08/03/15 175/72  07/27/15 118/84  07/27/15 94/75    Physical Exam  Constitutional: He is oriented to person, place, and time.  Patient appears fatigued, slightly weak, and chronically ill.  HENT:  Head: Normocephalic and atraumatic.  Mouth/Throat: Oropharynx is clear and moist.  Patient has a cold sore to the middle of his upper lip; as well as 2 smaller lesions to the left lower lip as well.  Eyes: Conjunctivae and EOM are normal. Pupils are equal, round, and reactive to light. Right eye exhibits no discharge. Left eye exhibits no discharge. No scleral icterus.  Neck: Normal range of motion. Neck supple. No JVD present. No tracheal deviation present. No thyromegaly present.  Cardiovascular: Normal rate, regular rhythm, normal heart sounds and intact distal pulses.   Pulmonary/Chest: Effort normal and breath sounds normal. No respiratory distress. He has no wheezes. He has no rales. He exhibits no tenderness.  Abdominal: Soft. Bowel sounds are normal. He exhibits no distension and no mass. There is no tenderness. There is no rebound and no guarding.  Musculoskeletal: Normal range of motion. He exhibits no edema or tenderness.  Lymphadenopathy:    He has no cervical adenopathy.  Neurological: He is alert and oriented to person, place, and time.  Skin: Skin is warm and dry. No rash noted. No erythema. There is pallor.  Psychiatric: Affect normal.  Nursing note and vitals reviewed.   LABORATORY DATA:. Appointment on 08/03/2015  Component Date Value Ref Range Status  . WBC 08/03/2015 8.5   4.0 - 10.3 10e3/uL Final  . NEUT# 08/03/2015 6.4  1.5 - 6.5 10e3/uL Final  . HGB 08/03/2015 10.7* 13.0 - 17.1 g/dL Final  . HCT 08/03/2015 31.9* 38.4 - 49.9 % Final  . Platelets 08/03/2015 351  140 - 400 10e3/uL Final  . MCV 08/03/2015 94.9  79.3 - 98.0 fL Final  . MCH 08/03/2015 31.8  27.2 - 33.4 pg Final  . MCHC 08/03/2015 33.5  32.0 - 36.0 g/dL Final  . RBC 08/03/2015 3.36* 4.20 - 5.82 10e6/uL Final  . RDW 08/03/2015 14.3  11.0 - 14.6 % Final  . lymph# 08/03/2015  1.4  0.9 - 3.3 10e3/uL Final  . MONO# 08/03/2015 0.5  0.1 - 0.9 10e3/uL Final  . Eosinophils Absolute 08/03/2015 0.2  0.0 - 0.5 10e3/uL Final  . Basophils Absolute 08/03/2015 0.0  0.0 - 0.1 10e3/uL Final  . NEUT% 08/03/2015 75.1* 39.0 - 75.0 % Final  . LYMPH% 08/03/2015 16.0  14.0 - 49.0 % Final  . MONO% 08/03/2015 6.4  0.0 - 14.0 % Final  . EOS% 08/03/2015 1.9  0.0 - 7.0 % Final  . BASO% 08/03/2015 0.6  0.0 - 2.0 % Final  . Sodium 08/03/2015 143  136 - 145 mEq/L Final  . Potassium 08/03/2015 4.3  3.5 - 5.1 mEq/L Final  . Chloride 08/03/2015 110* 98 - 109 mEq/L Final  . CO2 08/03/2015 26  22 - 29 mEq/L Final  . Glucose 08/03/2015 97  70 - 140 mg/dl Final   Glucose reference range is for nonfasting patients. Fasting glucose reference range is 70- 100.  Marland Kitchen BUN 08/03/2015 20.6  7.0 - 26.0 mg/dL Final  . Creatinine 08/03/2015 0.9  0.7 - 1.3 mg/dL Final  . Total Bilirubin 08/03/2015 0.30  0.20 - 1.20 mg/dL Final  . Alkaline Phosphatase 08/03/2015 66  40 - 150 U/L Final  . AST 08/03/2015 14  5 - 34 U/L Final  . ALT 08/03/2015 18  0 - 55 U/L Final  . Total Protein 08/03/2015 7.1  6.4 - 8.3 g/dL Final  . Albumin 08/03/2015 3.1* 3.5 - 5.0 g/dL Final  . Calcium 08/03/2015 9.3  8.4 - 10.4 mg/dL Final  . Anion Gap 08/03/2015 8  3 - 11 mEq/L Final  . EGFR 08/03/2015 75* >90 ml/min/1.73 m2 Final   eGFR is calculated using the CKD-EPI Creatinine Equation (2009)  . Technologist Review 08/03/2015 Rare Myelocyte   Final      RADIOGRAPHIC STUDIES: No results found.  ASSESSMENT/PLAN:    Prostate cancer [facility]) Patient initiated Xtandi oral therapy approximately  7 weeks ago for treatment of his prostate cancer.  He states that he began experiencing significant generalized myalgias.  Approximate one week later he states that the body aches had become so severe; that he discontinued the Xtandi on Sunday, 07/25/2015.  Patient states that he has significantly improved within this past week.  He no longer has the severe generalized myalgias; and is ambulating with only the assistance of a cane now.  He is both drinking and eating much better as well.  He denies any recent fevers or chills.  After reviewing these findings with Dr. Trixie Dredge was made to continue holding the Xtandi until patient returns for labs and a follow-up visit with Dr. Alen Blew on on 08/27/2015.    Hypoalbuminemia due to protein-calorie malnutrition ([place]) Albumen continues low at 3.1 today.  Patient was encouraged to once can push protein in her diet is much as possible.  We'll continue to monitor.  Hypertension Patient was noted to have an elevated blood pressure when at the [place] last week.  He has continued to hold his oral chemotherapy agent as directed; and blood pressure has significantly improved to 175/72 today.  However, patient is still considered hypertensive; and was advised to follow-up with his primary care physician for further evaluation and management.  Patient may very well need to consider initiation of antihypertension medications is high blood pressure continues.  Also, patient was advised to take his blood pressure when he is at home and document it.  Herpes simplex Patient states that he has a history of  intermittent, chronic cold sores to his lips; but has just recently noticed a significant increase in the amount of cold sores to his mouth.  Last week he only had one healing lesion to his lip; but this week  he has 3 different lesions to his lips.  All of these appear to be slowly resolving.  Will prescribe valacyclovir for treatment of these herpetic cold sores.    Patient stated understanding of all instructions; and was in agreement with this plan of care. The patient knows to call the clinic with any problems, questions or concerns.   Review/collaboration with Dr. Alen Blew regarding all aspects of patient's visit today.   Total time spent with patient was 25 minutes;  with greater than 75 percent of that time spent in face to face counseling regarding patient's symptoms,  and coordination of care and follow up.  Disclaimer:This dictation was prepared with Dragon/digital dictation along with [employer]. Any transcriptional errors that result from this process are unintentional.  Drue Second, NP 08/04/2015

## 2015-08-04 NOTE — Assessment & Plan Note (Signed)
Albumen continues low at 3.1 today.  Patient was encouraged to once can push protein in her diet is much as possible.  We'll continue to monitor.

## 2015-08-04 NOTE — Assessment & Plan Note (Signed)
Patient states that he has a history of intermittent, chronic cold sores to his lips; but has just recently noticed a significant increase in the amount of cold sores to his mouth.  Last week he only had one healing lesion to his lip; but this week he has 3 different lesions to his lips.  All of these appear to be slowly resolving.  Will prescribe valacyclovir for treatment of these herpetic cold sores.

## 2015-08-04 NOTE — Assessment & Plan Note (Addendum)
Patient initiated Xtandi oral therapy approximately  7 weeks ago for treatment of his prostate cancer.  He states that he began experiencing significant generalized myalgias.  Approximate one week later he states that the body aches had become so severe; that he discontinued the Xtandi on Sunday, 07/25/2015.  Patient states that he has significantly improved within this past week.  He no longer has the severe generalized myalgias; and is ambulating with only the assistance of a cane now.  He is both drinking and eating much better as well.  He denies any recent fevers or chills.  After reviewing these findings with Dr. Trixie Dredge was made to continue holding the Xtandi until patient returns for labs and a follow-up visit with Dr. Alen Blew on on 08/27/2015.

## 2015-08-04 NOTE — Assessment & Plan Note (Addendum)
Patient was noted to have an elevated blood pressure when at the [place] last week.  He has continued to hold his oral chemotherapy agent as directed; and blood pressure has significantly improved to 175/72 today.  However, patient is still considered hypertensive; and was advised to follow-up with his primary care physician for further evaluation and management.  Patient may very well need to consider initiation of antihypertension medications is high blood pressure continues.  Also, patient was advised to take his blood pressure when he is at home and document it.

## 2015-08-17 ENCOUNTER — Other Ambulatory Visit: Payer: Self-pay | Admitting: *Deleted

## 2015-08-26 ENCOUNTER — Other Ambulatory Visit: Payer: Self-pay | Admitting: *Deleted

## 2015-08-26 DIAGNOSIS — C61 Malignant neoplasm of prostate: Secondary | ICD-10-CM

## 2015-08-26 DIAGNOSIS — G893 Neoplasm related pain (acute) (chronic): Secondary | ICD-10-CM

## 2015-08-27 ENCOUNTER — Telehealth: Payer: Self-pay | Admitting: Oncology

## 2015-08-27 ENCOUNTER — Ambulatory Visit (HOSPITAL_BASED_OUTPATIENT_CLINIC_OR_DEPARTMENT_OTHER): Payer: Medicare HMO | Admitting: Oncology

## 2015-08-27 ENCOUNTER — Other Ambulatory Visit (HOSPITAL_BASED_OUTPATIENT_CLINIC_OR_DEPARTMENT_OTHER): Payer: Medicare HMO

## 2015-08-27 VITALS — BP 138/66 | HR 76 | Temp 98.4°F | Resp 18 | Ht 69.0 in | Wt 224.8 lb

## 2015-08-27 DIAGNOSIS — C7951 Secondary malignant neoplasm of bone: Secondary | ICD-10-CM

## 2015-08-27 DIAGNOSIS — C61 Malignant neoplasm of prostate: Secondary | ICD-10-CM

## 2015-08-27 DIAGNOSIS — G893 Neoplasm related pain (acute) (chronic): Secondary | ICD-10-CM | POA: Diagnosis not present

## 2015-08-27 LAB — CBC WITH DIFFERENTIAL/PLATELET
BASO%: 0.7 % (ref 0.0–2.0)
BASOS ABS: 0 10*3/uL (ref 0.0–0.1)
EOS ABS: 0.2 10*3/uL (ref 0.0–0.5)
EOS%: 3.5 % (ref 0.0–7.0)
HCT: 34.8 % — ABNORMAL LOW (ref 38.4–49.9)
HEMOGLOBIN: 11.6 g/dL — AB (ref 13.0–17.1)
LYMPH%: 28.2 % (ref 14.0–49.0)
MCH: 31.9 pg (ref 27.2–33.4)
MCHC: 33.3 g/dL (ref 32.0–36.0)
MCV: 95.7 fL (ref 79.3–98.0)
MONO#: 0.4 10*3/uL (ref 0.1–0.9)
MONO%: 8.5 % (ref 0.0–14.0)
NEUT#: 2.9 10*3/uL (ref 1.5–6.5)
NEUT%: 59.1 % (ref 39.0–75.0)
Platelets: 179 10*3/uL (ref 140–400)
RBC: 3.63 10*6/uL — AB (ref 4.20–5.82)
RDW: 15 % — ABNORMAL HIGH (ref 11.0–14.6)
WBC: 4.9 10*3/uL (ref 4.0–10.3)
lymph#: 1.4 10*3/uL (ref 0.9–3.3)

## 2015-08-27 LAB — COMPREHENSIVE METABOLIC PANEL
ALT: 10 U/L (ref 0–55)
AST: 14 U/L (ref 5–34)
Albumin: 4 g/dL (ref 3.5–5.0)
Alkaline Phosphatase: 76 U/L (ref 40–150)
Anion Gap: 9 mEq/L (ref 3–11)
BILIRUBIN TOTAL: 0.5 mg/dL (ref 0.20–1.20)
BUN: 25.2 mg/dL (ref 7.0–26.0)
CO2: 28 meq/L (ref 22–29)
CREATININE: 0.9 mg/dL (ref 0.7–1.3)
Calcium: 9.8 mg/dL (ref 8.4–10.4)
Chloride: 104 mEq/L (ref 98–109)
EGFR: 76 mL/min/{1.73_m2} — ABNORMAL LOW (ref >90)
GLUCOSE: 94 mg/dL (ref 70–140)
Potassium: 4.4 mEq/L (ref 3.5–5.1)
SODIUM: 141 meq/L (ref 136–145)
TOTAL PROTEIN: 6.9 g/dL (ref 6.4–8.3)

## 2015-08-27 NOTE — Progress Notes (Signed)
Hematology and Oncology Follow Up Visit  [name] [name] [id number] [birth year] 81 y.o. 08/27/2015 4:06 PM [name] [name] [name] [name], [name] [place], MD   Principle Diagnosis: 81 year old gentleman diagnosed with prostate cancer in 1998 after presenting with a Gleason score of 9. 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   Prior Therapy: He did develop recurrent disease in October 2014 and required a channel TUR. He subsequently was started on firmagon which controlled his PSA reasonably well.  His most recently his PSA was up to 1.91 in June 2016 after a rise from 0.78 in March 2016. Before that was 0.53 in November 2015.   Current therapy:  Androgen deprivation. Administered at [facility]. Xtandi 160 mg daily started in August 2016. Therapy has been on hold since November 2016 due to poor tolerance.  Interim History: [name] [name] is as today for a follow-up visit. Since the last visit, he reported increased complications associated with Xtandi. He reported increased fatigue, tiredness and failure to thrive. He had poor by mouth intake and had difficult time ambulating and his wife needed a lot of assistance to get him into clinic appointments. He was instructed at that time to stop this medication. Since he has done so, he had significant improvement in his symptoms. Although not fully recovered he is able to ambulate without any major difficulties. His appetite is improved and have gained his weight back.  He is no longer working part-time because of his recent decline.  He does not report any headaches, blurry vision, syncope or seizures. He does not report any fevers, chills, sweats or weight loss. He is not reporting any chest pain, palpitation, orthopnea or leg edema. He does not report any cough or hemoptysis or hematemesis. He does not report any nausea, vomiting, abdominal pain, hematochezia. He does report incontinence but no hematuria, dysuria, nocturia. He does not  report any skeletal complaints. Remainder review of systems unremarkable.   Medications: I have reviewed the patient's current medications.  Current Outpatient Prescriptions  Medication Sig Dispense Refill  . acetaminophen (TYLENOL) 325 MG tablet Take 650 mg by mouth every 6 (six) hours as needed.    Marland Kitchen levothyroxine (SYNTHROID, LEVOTHROID) 150 MCG tablet Take 150 mcg by mouth daily before breakfast.    . meloxicam (MOBIC) 7.5 MG tablet Take 7.5 mg by mouth 2 (two) times daily.     . valACYclovir (VALTREX) 1000 MG tablet Take 1 tablet (1,000 mg total) by mouth 3 (three) times daily. 7 tablet 1  . enzalutamide (XTANDI) 40 MG capsule Take 4 capsules (160 mg total) by mouth daily. (Patient not taking: Reported on 07/27/2015) 120 capsule 0   No current facility-administered medications for this visit.     Allergies: No Known Allergies  Past Medical History, Surgical history, Social history, and Family History were reviewed and updated.   Physical Exam: Blood pressure 138/66, pulse 76, temperature 98.4 F (36.9 C), temperature source Oral, resp. rate 18, height 5\' 9"  (1.753 m), weight 224 lb 12.8 oz (101.969 kg), SpO2 98 %. ECOG: 1 General appearance: alert and cooperative not in any distress. Head: Normocephalic, without obvious abnormality no oral thrush. Neck: no adenopathy Lymph nodes: Cervical, supraclavicular, and axillary nodes normal. Heart:regular rate and rhythm, S1, S2 normal, no murmur, click, rub or gallop Lung:chest clear, no wheezing, rales, normal symmetric air entry no dullness to percussion. Abdomin: soft, non-tender, without masses or organomegaly  EXT:no erythema, induration, or nodules   Lab Results: Lab Results  Component Value Date  WBC 4.9 08/27/2015   HGB 11.6* 08/27/2015   HCT 34.8* 08/27/2015   MCV 95.7 08/27/2015   PLT 179 08/27/2015     Chemistry      Component Value Date/Time   NA 141 08/27/2015 1446   NA 139 07/21/2013 0745   K 4.4 08/27/2015  1446   K 4.2 07/21/2013 0745   CL 103 07/21/2013 0745   CO2 28 08/27/2015 1446   CO2 28 07/21/2013 0745   BUN 25.2 08/27/2015 1446   BUN 25* 07/21/2013 0745   CREATININE 0.9 08/27/2015 1446   CREATININE 0.89 07/21/2013 0745      Component Value Date/Time   CALCIUM 9.8 08/27/2015 1446   CALCIUM 9.5 07/21/2013 0745   ALKPHOS 76 08/27/2015 1446   AST 14 08/27/2015 1446   ALT 10 08/27/2015 1446   BILITOT 0.50 08/27/2015 1446      Results for [name] [name], [name] [name] (MRN [id number]) as of 08/27/2015 16:11  Ref. Range 06/02/2015 13:52 07/02/2015 12:29  PSA Latest Ref Range: <=4.00 ng/mL 0.41 0.21     Impression and Plan:   81 year old gentleman with the following issues:  1. Prostate cancer diagnosed in 1998 after presenting with a Gleason score 9 and received definitive therapy with seed implants. He developed recurrent disease in 2014 and have been on hormone therapy since 2015. He had a reasonable response initially and his last PSA was up to 1.91 in June 2016 from 0.78 in March 2016. He is quite asymptomatic.   CT scan and bone scan obtained on 04/08/2015  Showed stable disease with increased uptake at T8 vertebra and lumbar spine which is suggestive of metastatic disease or possible arthritis. CT scan of the abdomen and pelvis did not show any visceral metastasis.  He is currently on Xtandi and had an excellent response with a PSA dropped to 0.21 but had really poor tolerance to it and have been on hold for the last month. The plan is to continue to hold therapy at this time and observe for the time being. If his PSA starts to rise again we can consider restarting xtandi at 40 mg dose and titrate only if he can tolerate. Alternatively, Zytiga can be used as well. Given the low volume disease at this time it would be reasonable to continue with observation and surveillance at this time.   2. Hormone therapy: I have recommended continuing that for the time being. This will need to be  continued indefinitely.  3. Fatigue and decline in his performance status: Improved upon treatment discontinuation of Xtandi.  4. Liver function and kidney function surveillance: His chemistries were personally reviewed and all within normal range.  5. Follow-up: Will be in 6 weeks to assess for complications from this medication.   Zola Button, MD 12/2/20164:06 PM

## 2015-08-27 NOTE — Telephone Encounter (Signed)
per pof to sch pt appt-gave pt copy of avs °

## 2015-08-28 LAB — PSA: PSA: 0.12 ng/mL (ref <=4.00)

## 2015-08-30 ENCOUNTER — Telehealth: Payer: Self-pay | Admitting: *Deleted

## 2015-08-30 NOTE — Telephone Encounter (Signed)
-----   Message from Wyatt Portela, MD sent at 08/30/2015  9:20 AM EST ----- Please call his PSA. Down again.

## 2015-08-30 NOTE — Progress Notes (Signed)
Spoke with patient, gave results of last PSA 

## 2015-09-28 DIAGNOSIS — C61 Malignant neoplasm of prostate: Secondary | ICD-10-CM | POA: Diagnosis not present

## 2015-10-04 DIAGNOSIS — C61 Malignant neoplasm of prostate: Secondary | ICD-10-CM | POA: Diagnosis not present

## 2015-10-04 DIAGNOSIS — Z0000 Encounter for general adult medical examination without abnormal findings: Secondary | ICD-10-CM | POA: Diagnosis not present

## 2015-10-04 DIAGNOSIS — N390 Urinary tract infection, site not specified: Secondary | ICD-10-CM | POA: Diagnosis not present

## 2015-10-04 DIAGNOSIS — N200 Calculus of kidney: Secondary | ICD-10-CM | POA: Diagnosis not present

## 2015-10-04 DIAGNOSIS — N393 Stress incontinence (female) (male): Secondary | ICD-10-CM | POA: Diagnosis not present

## 2015-10-04 DIAGNOSIS — N3941 Urge incontinence: Secondary | ICD-10-CM | POA: Diagnosis not present

## 2015-10-12 ENCOUNTER — Other Ambulatory Visit (HOSPITAL_BASED_OUTPATIENT_CLINIC_OR_DEPARTMENT_OTHER): Payer: Medicare HMO

## 2015-10-12 ENCOUNTER — Telehealth: Payer: Self-pay | Admitting: Oncology

## 2015-10-12 ENCOUNTER — Ambulatory Visit (HOSPITAL_BASED_OUTPATIENT_CLINIC_OR_DEPARTMENT_OTHER): Payer: Medicare HMO | Admitting: Oncology

## 2015-10-12 VITALS — BP 139/58 | HR 78 | Temp 98.8°F | Resp 18 | Ht 69.0 in | Wt 229.6 lb

## 2015-10-12 DIAGNOSIS — C61 Malignant neoplasm of prostate: Secondary | ICD-10-CM

## 2015-10-12 DIAGNOSIS — C7951 Secondary malignant neoplasm of bone: Secondary | ICD-10-CM

## 2015-10-12 DIAGNOSIS — E291 Testicular hypofunction: Secondary | ICD-10-CM

## 2015-10-12 DIAGNOSIS — R5383 Other fatigue: Secondary | ICD-10-CM

## 2015-10-12 LAB — CBC WITH DIFFERENTIAL/PLATELET
BASO%: 0.6 % (ref 0.0–2.0)
BASOS ABS: 0 10*3/uL (ref 0.0–0.1)
EOS ABS: 0.2 10*3/uL (ref 0.0–0.5)
EOS%: 4.3 % (ref 0.0–7.0)
HEMATOCRIT: 37.2 % — AB (ref 38.4–49.9)
HEMOGLOBIN: 12.3 g/dL — AB (ref 13.0–17.1)
LYMPH#: 1.5 10*3/uL (ref 0.9–3.3)
LYMPH%: 25.1 % (ref 14.0–49.0)
MCH: 31.9 pg (ref 27.2–33.4)
MCHC: 33.1 g/dL (ref 32.0–36.0)
MCV: 96.5 fL (ref 79.3–98.0)
MONO#: 0.5 10*3/uL (ref 0.1–0.9)
MONO%: 8.3 % (ref 0.0–14.0)
NEUT%: 61.7 % (ref 39.0–75.0)
NEUTROS ABS: 3.6 10*3/uL (ref 1.5–6.5)
Platelets: 175 10*3/uL (ref 140–400)
RBC: 3.85 10*6/uL — ABNORMAL LOW (ref 4.20–5.82)
RDW: 14.2 % (ref 11.0–14.6)
WBC: 5.8 10*3/uL (ref 4.0–10.3)

## 2015-10-12 LAB — COMPREHENSIVE METABOLIC PANEL
ALT: 14 U/L (ref 0–55)
AST: 13 U/L (ref 5–34)
Albumin: 4 g/dL (ref 3.5–5.0)
Alkaline Phosphatase: 80 U/L (ref 40–150)
Anion Gap: 6 mEq/L (ref 3–11)
BILIRUBIN TOTAL: 0.36 mg/dL (ref 0.20–1.20)
BUN: 22.1 mg/dL (ref 7.0–26.0)
CO2: 28 meq/L (ref 22–29)
CREATININE: 1 mg/dL (ref 0.7–1.3)
Calcium: 9.2 mg/dL (ref 8.4–10.4)
Chloride: 109 mEq/L (ref 98–109)
EGFR: 72 mL/min/{1.73_m2} — ABNORMAL LOW (ref >90)
GLUCOSE: 107 mg/dL (ref 70–140)
Potassium: 4.4 mEq/L (ref 3.5–5.1)
SODIUM: 143 meq/L (ref 136–145)
TOTAL PROTEIN: 7 g/dL (ref 6.4–8.3)

## 2015-10-12 NOTE — Progress Notes (Signed)
Hematology and Oncology Follow Up Visit  [name] [name] [id number] 24-Aug-1932 82 y.o. 10/12/2015 2:06 PM [name],[name] [name], [name] [place], MD   Principle Diagnosis: 82 year old gentleman diagnosed with prostate cancer in 1998 after presenting with a Gleason score of 9. 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   Prior Therapy: He did develop recurrent disease in October 2014 and required a channel TUR. He subsequently was started on firmagon which controlled his PSA reasonably well.  His most recently his PSA was up to 1.91 in June 2016 after a rise from 0.78 in March 2016. Before that was 0.53 in November 2015.   Current therapy:  Androgen deprivation. Administered at [facility] urology. Xtandi 160 mg daily started in August 2016. Therapy has been on hold since November 2016 due to poor tolerance.  Interim History: [name] [name] is as today for a follow-up visit. Since the last visit, he feels relatively well and back to baseline. He does report mild fatigue but have not changed dramatically. He feels much better off Xtandi and have returned to his baseline activities.He is no longer working part-time because of his recent decline but does exercise regularly. He participates in water aerobics and use stationary bike. He is able to drive and attends to activities of daily living. He does use a cane at times for balance.  He does not report any headaches, blurry vision, syncope or seizures. He does not report any fevers, chills, sweats or weight loss. He is not reporting any chest pain, palpitation, orthopnea or leg edema. He does not report any cough or hemoptysis or hematemesis. He does not report any nausea, vomiting, abdominal pain, hematochezia. He does report incontinence but no hematuria, dysuria, nocturia. He does not report any skeletal complaints. Remainder review of systems unremarkable.   Medications: I have reviewed the patient's current medications.  Current  Outpatient Prescriptions  Medication Sig Dispense Refill  . acetaminophen (TYLENOL) 325 MG tablet Take 650 mg by mouth every 6 (six) hours as needed.    Marland Kitchen levothyroxine (SYNTHROID, LEVOTHROID) 150 MCG tablet Take 150 mcg by mouth daily before breakfast.    . meloxicam (MOBIC) 7.5 MG tablet Take 7.5 mg by mouth 2 (two) times daily.     Marland Kitchen MYRBETRIQ 50 MG TB24 tablet Take 50 mg by mouth daily.  0   No current facility-administered medications for this visit.     Allergies: No Known Allergies  Past Medical History, Surgical history, Social history, and Family History were reviewed and updated.   Physical Exam: Blood pressure 139/58, pulse 78, temperature 98.8 F (37.1 C), temperature source Oral, resp. rate 18, height 5\' 9"  (1.753 m), weight 229 lb 9.6 oz (104.146 kg), SpO2 94 %. ECOG: 1 General appearance: alert and cooperative well-appearing gentleman without distress. Head: Normocephalic, without obvious abnormality no oral ulcers or lesions. Neck: no adenopathy Lymph nodes: Cervical, supraclavicular, and axillary nodes normal. Heart:regular rate and rhythm, S1, S2 normal, no murmur, click, rub or gallop Lung:chest clear, no wheezing, rales, normal symmetric air entry no dullness to percussion. Abdomin: soft, non-tender, without masses or organomegaly no shifting dullness or ascites. EXT:no erythema, induration, or nodules   Lab Results: Lab Results  Component Value Date   WBC 5.8 10/12/2015   HGB 12.3* 10/12/2015   HCT 37.2* 10/12/2015   MCV 96.5 10/12/2015   PLT 175 10/12/2015     Chemistry      Component Value Date/Time   NA 143 10/12/2015 1318   NA 139 07/21/2013 0745  K 4.4 10/12/2015 1318   K 4.2 07/21/2013 0745   CL 103 07/21/2013 0745   CO2 28 10/12/2015 1318   CO2 28 07/21/2013 0745   BUN 22.1 10/12/2015 1318   BUN 25* 07/21/2013 0745   CREATININE 1.0 10/12/2015 1318   CREATININE 0.89 07/21/2013 0745      Component Value Date/Time   CALCIUM 9.2  10/12/2015 1318   CALCIUM 9.5 07/21/2013 0745   ALKPHOS 80 10/12/2015 1318   AST 13 10/12/2015 1318   ALT 14 10/12/2015 1318   BILITOT 0.36 10/12/2015 1318       Results for [name] [name] (MRN [id number]) as of 10/12/2015 14:00  Ref. Range 06/02/2015 13:52 07/02/2015 12:29 08/27/2015 14:46  PSA Latest Ref Range: <=4.00 ng/mL 0.41 0.21 0.12     Impression and Plan:   82 year old gentleman with the following issues:  1. Prostate cancer diagnosed in 1998 after presenting with a Gleason score 9 and received definitive therapy with seed implants. He developed recurrent disease in 2014 and have been on hormone therapy since 2015. He had a reasonable response initially and his last PSA was up to 1.91 in June 2016 from 0.78 in March 2016. He is quite asymptomatic.   CT scan and bone scan obtained on 04/08/2015  Showed stable disease with increased uptake at T8 vertebra and lumbar spine which is suggestive of metastatic disease or possible arthritis. CT scan of the abdomen and pelvis did not show any visceral metastasis.  After brief treatment with [name] [name], his PSA declined to 0.12 but tolerated the medication poorly. He has been off this medication now for close to 2 months and the plan is to resume it upon PSA rise. He is willing to try it to 40 mg daily.   2. Hormone therapy: I have recommended continuing that for the time being. This will need to be continued indefinitely.  3. Fatigue and decline in his performance status: Improved upon treatment discontinuation of Xtandi. He is back to baseline and have resumed most activities of daily living.  4. Liver function and kidney function surveillance: His chemistries were personally reviewed and all within normal range.  5. Follow-up: Will be in 6 weeks to assess his clinical status and PSA.   [facility], MD 1/17/20172:06 PM

## 2015-10-12 NOTE — Telephone Encounter (Signed)
per pof to sch pt appt-gave pt copy of avs °

## 2015-10-13 ENCOUNTER — Telehealth: Payer: Self-pay | Admitting: *Deleted

## 2015-10-13 LAB — PSA: PROSTATE SPECIFIC AG, SERUM: 0.1 ng/mL (ref 0.0–4.0)

## 2015-10-13 LAB — PSA (PARALLEL TESTING): PSA: 0.15 ng/mL (ref <=4.00)

## 2015-10-13 NOTE — Telephone Encounter (Signed)
-----   Message from Wyatt Portela, MD sent at 10/13/2015  8:03 AM EST ----- Please call his PSA. Still very low with little change.

## 2015-10-13 NOTE — Telephone Encounter (Signed)
Spoke with wife, gave PSA lab result

## 2015-11-26 ENCOUNTER — Telehealth: Payer: Self-pay | Admitting: Oncology

## 2015-11-26 ENCOUNTER — Ambulatory Visit (HOSPITAL_BASED_OUTPATIENT_CLINIC_OR_DEPARTMENT_OTHER): Payer: Medicare HMO | Admitting: Oncology

## 2015-11-26 ENCOUNTER — Other Ambulatory Visit (HOSPITAL_BASED_OUTPATIENT_CLINIC_OR_DEPARTMENT_OTHER): Payer: Medicare HMO

## 2015-11-26 VITALS — BP 119/54 | HR 81 | Temp 99.0°F | Resp 17 | Ht 69.0 in | Wt 224.8 lb

## 2015-11-26 DIAGNOSIS — C61 Malignant neoplasm of prostate: Secondary | ICD-10-CM | POA: Diagnosis not present

## 2015-11-26 DIAGNOSIS — E291 Testicular hypofunction: Secondary | ICD-10-CM | POA: Diagnosis not present

## 2015-11-26 DIAGNOSIS — C7951 Secondary malignant neoplasm of bone: Secondary | ICD-10-CM | POA: Diagnosis not present

## 2015-11-26 DIAGNOSIS — R5383 Other fatigue: Secondary | ICD-10-CM

## 2015-11-26 LAB — CBC WITH DIFFERENTIAL/PLATELET
BASO%: 0.4 % (ref 0.0–2.0)
Basophils Absolute: 0 10*3/uL (ref 0.0–0.1)
EOS%: 3.9 % (ref 0.0–7.0)
Eosinophils Absolute: 0.2 10*3/uL (ref 0.0–0.5)
HEMATOCRIT: 40.2 % (ref 38.4–49.9)
HEMOGLOBIN: 13.3 g/dL (ref 13.0–17.1)
LYMPH#: 1.5 10*3/uL (ref 0.9–3.3)
LYMPH%: 28.3 % (ref 14.0–49.0)
MCH: 30.9 pg (ref 27.2–33.4)
MCHC: 33.1 g/dL (ref 32.0–36.0)
MCV: 93.3 fL (ref 79.3–98.0)
MONO#: 0.4 10*3/uL (ref 0.1–0.9)
MONO%: 7.9 % (ref 0.0–14.0)
NEUT#: 3.2 10*3/uL (ref 1.5–6.5)
NEUT%: 59.5 % (ref 39.0–75.0)
Platelets: 179 10*3/uL (ref 140–400)
RBC: 4.31 10*6/uL (ref 4.20–5.82)
RDW: 13.9 % (ref 11.0–14.6)
WBC: 5.4 10*3/uL (ref 4.0–10.3)

## 2015-11-26 LAB — COMPREHENSIVE METABOLIC PANEL
ALBUMIN: 4.3 g/dL (ref 3.5–5.0)
ALK PHOS: 82 U/L (ref 40–150)
ALT: 12 U/L (ref 0–55)
AST: 16 U/L (ref 5–34)
Anion Gap: 10 mEq/L (ref 3–11)
BUN: 23.8 mg/dL (ref 7.0–26.0)
CALCIUM: 9.8 mg/dL (ref 8.4–10.4)
CHLORIDE: 104 meq/L (ref 98–109)
CO2: 26 mEq/L (ref 22–29)
Creatinine: 1.1 mg/dL (ref 0.7–1.3)
EGFR: 64 mL/min/{1.73_m2} — ABNORMAL LOW (ref >90)
Glucose: 95 mg/dl (ref 70–140)
POTASSIUM: 4.5 meq/L (ref 3.5–5.1)
Sodium: 140 mEq/L (ref 136–145)
Total Bilirubin: 0.58 mg/dL (ref 0.20–1.20)
Total Protein: 7.5 g/dL (ref 6.4–8.3)

## 2015-11-26 NOTE — Telephone Encounter (Signed)
Gave and printed appt sched and avs fo rpt for May °

## 2015-11-26 NOTE — Progress Notes (Signed)
Hematology and Oncology Follow Up Visit  HONOR BERTINO [id number] 06/16/1932 82 y.o. 11/26/2015 2:49 PM [name],ROBERT [name], [name], MD   Principle Diagnosis: 82 year old gentleman diagnosed with prostate cancer in 1998 after presenting with a Gleason score of 9. 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   Prior Therapy: He did develop recurrent disease in October 2014 and required a channel TUR. He subsequently was started on firmagon which controlled his PSA reasonably well.  His most recently his PSA was up to 1.91 in June 2016 after a rise from 0.78 in March 2016. Before that was 0.53 in November 2015.   Current therapy:  Androgen deprivation. Administered at [facility] urology. Xtandi 160 mg daily started in August 2016. Therapy has been on hold since November 2016 due to poor tolerance.  Interim History: Mr. Santangelo is as today for a follow-up visit. Since the last visit, he reports feeling back to his baseline. He denies any residual implications related to xtandi. He have returned to his baseline activities. He is able to drive and attends to activities of daily living. He does use a cane at times for balance and has not reported any syncope or seizures. Has not reported any trips or falls. He is no longer reported the weakness episodes that he had before. He does exercise periodically utilizing water aerobics. He has not resumed any work-related duties at this time.  He does not report any headaches, blurry vision, syncope or seizures. He does not report any fevers, chills, sweats or weight loss. He is not reporting any chest pain, palpitation, orthopnea or leg edema. He does not report any cough or hemoptysis or hematemesis. He does not report any nausea, vomiting, abdominal pain, hematochezia. He does report incontinence but no hematuria, dysuria, nocturia. He does not report any skeletal complaints. Remainder review of systems unremarkable.   Medications: I have  reviewed the patient's current medications.  Current Outpatient Prescriptions  Medication Sig Dispense Refill  . acetaminophen (TYLENOL) 325 MG tablet Take 650 mg by mouth every 6 (six) hours as needed.    Marland Kitchen levothyroxine (SYNTHROID, LEVOTHROID) 150 MCG tablet Take 150 mcg by mouth daily before breakfast.    . meloxicam (MOBIC) 7.5 MG tablet Take 7.5 mg by mouth 2 (two) times daily.     Marland Kitchen MYRBETRIQ 50 MG TB24 tablet Take 50 mg by mouth daily.  0   No current facility-administered medications for this visit.     Allergies: No Known Allergies  Past Medical History, Surgical history, Social history, and Family History were reviewed and updated.   Physical Exam: Blood pressure 119/54, pulse 81, temperature 99 F (37.2 C), temperature source Oral, resp. rate 17, height 5\' 9"  (1.753 m), weight 224 lb 12.8 oz (101.969 kg), SpO2 98 %. ECOG: 1 General appearance: alert and cooperative not in any distress. Head: Normocephalic, without obvious abnormality no oral ulcers or lesions. No oral thrush. Neck: no adenopathy Lymph nodes: Cervical, supraclavicular, and axillary nodes normal. Heart:regular rate and rhythm, S1, S2 normal, no murmur, click, rub or gallop Lung:chest clear, no wheezing, rales, normal symmetric air entry.  Abdomin: soft, non-tender, without masses or organomegaly no rebound or guarding. EXT:no erythema, induration, or nodules   Lab Results: Lab Results  Component Value Date   WBC 5.4 11/26/2015   HGB 13.3 11/26/2015   HCT 40.2 11/26/2015   MCV 93.3 11/26/2015   PLT 179 11/26/2015     Chemistry      Component Value Date/Time  NA 143 10/12/2015 1318   NA 139 07/21/2013 0745   K 4.4 10/12/2015 1318   K 4.2 07/21/2013 0745   CL 103 07/21/2013 0745   CO2 28 10/12/2015 1318   CO2 28 07/21/2013 0745   BUN 22.1 10/12/2015 1318   BUN 25* 07/21/2013 0745   CREATININE 1.0 10/12/2015 1318   CREATININE 0.89 07/21/2013 0745      Component Value Date/Time   CALCIUM  9.2 10/12/2015 1318   CALCIUM 9.5 07/21/2013 0745   ALKPHOS 80 10/12/2015 1318   AST 13 10/12/2015 1318   ALT 14 10/12/2015 1318   BILITOT 0.36 10/12/2015 1318      Results for ZAM, ENT (MRN [id number]) as of 11/26/2015 14:52  Ref. Range 08/27/2015 14:46 10/12/2015 13:18 10/12/2015 13:18  PSA Latest Ref Range: 0.0-4.0 ng/mL 0.12 0.15 0.1       Impression and Plan:   82 year old gentleman with the following issues:  1. Prostate cancer diagnosed in 1998 after presenting with a Gleason score 9 and received definitive therapy with seed implants. He developed recurrent disease in 2014 and have been on hormone therapy since 2015. He had a reasonable response initially and his last PSA was up to 1.91 in June 2016 from 0.78 in March 2016. He is quite asymptomatic.   CT scan and bone scan obtained on 04/08/2015  Showed stable disease with increased uptake at T8 vertebra and lumbar spine which is suggestive of metastatic disease or possible arthritis. CT scan of the abdomen and pelvis did not show any visceral metastasis.  After brief treatment with Gillermina Phy, his PSA declined to 0.12 but tolerated the medication poorly. He is currently not receiving any treatment other than hormone therapy.  His last PSA continues to be relatively low at 0.15 without any need for intervention. If his PSA starts to rise rapidly, we will suggest starting different therapy likely Zytiga.   2. Hormone therapy: I have recommended continuing that for the time being. This will need to be continued indefinitely.  3. Fatigue and decline in his performance status: This have resolved upon discontinuation of Xtandi.  4. Follow-up: Will be in 8 weeks to assess his clinical status and PSA.   Zola Button, MD 3/3/20172:49 PM

## 2015-11-27 LAB — PSA: Prostate Specific Ag, Serum: 0.4 ng/mL (ref 0.0–4.0)

## 2015-11-27 LAB — PSA (PARALLEL TESTING): PSA: 0.42 ng/mL (ref <=4.00)

## 2015-12-27 DIAGNOSIS — C61 Malignant neoplasm of prostate: Secondary | ICD-10-CM | POA: Diagnosis not present

## 2016-01-03 DIAGNOSIS — C61 Malignant neoplasm of prostate: Secondary | ICD-10-CM | POA: Diagnosis not present

## 2016-01-03 DIAGNOSIS — Z0000 Encounter for general adult medical examination without abnormal findings: Secondary | ICD-10-CM | POA: Diagnosis not present

## 2016-01-03 DIAGNOSIS — N3941 Urge incontinence: Secondary | ICD-10-CM | POA: Diagnosis not present

## 2016-01-03 DIAGNOSIS — N200 Calculus of kidney: Secondary | ICD-10-CM | POA: Diagnosis not present

## 2016-01-03 DIAGNOSIS — N390 Urinary tract infection, site not specified: Secondary | ICD-10-CM | POA: Diagnosis not present

## 2016-01-05 ENCOUNTER — Encounter: Payer: Self-pay | Admitting: *Deleted

## 2016-01-28 ENCOUNTER — Ambulatory Visit (HOSPITAL_BASED_OUTPATIENT_CLINIC_OR_DEPARTMENT_OTHER): Payer: Medicare HMO | Admitting: Oncology

## 2016-01-28 ENCOUNTER — Other Ambulatory Visit (HOSPITAL_BASED_OUTPATIENT_CLINIC_OR_DEPARTMENT_OTHER): Payer: Medicare HMO

## 2016-01-28 ENCOUNTER — Telehealth: Payer: Self-pay | Admitting: Oncology

## 2016-01-28 VITALS — BP 136/60 | HR 76 | Temp 98.7°F | Resp 18 | Ht 69.0 in | Wt 229.1 lb

## 2016-01-28 DIAGNOSIS — C61 Malignant neoplasm of prostate: Secondary | ICD-10-CM

## 2016-01-28 DIAGNOSIS — E291 Testicular hypofunction: Secondary | ICD-10-CM

## 2016-01-28 DIAGNOSIS — C7951 Secondary malignant neoplasm of bone: Secondary | ICD-10-CM | POA: Diagnosis not present

## 2016-01-28 LAB — CBC WITH DIFFERENTIAL/PLATELET
BASO%: 0.3 % (ref 0.0–2.0)
Basophils Absolute: 0 10*3/uL (ref 0.0–0.1)
EOS ABS: 0.3 10*3/uL (ref 0.0–0.5)
EOS%: 4.4 % (ref 0.0–7.0)
HCT: 36.3 % — ABNORMAL LOW (ref 38.4–49.9)
HEMOGLOBIN: 12 g/dL — AB (ref 13.0–17.1)
LYMPH%: 27.5 % (ref 14.0–49.0)
MCH: 31.4 pg (ref 27.2–33.4)
MCHC: 33.1 g/dL (ref 32.0–36.0)
MCV: 95 fL (ref 79.3–98.0)
MONO#: 0.4 10*3/uL (ref 0.1–0.9)
MONO%: 6.3 % (ref 0.0–14.0)
NEUT%: 61.5 % (ref 39.0–75.0)
NEUTROS ABS: 3.5 10*3/uL (ref 1.5–6.5)
PLATELETS: 164 10*3/uL (ref 140–400)
RBC: 3.82 10*6/uL — ABNORMAL LOW (ref 4.20–5.82)
RDW: 13.7 % (ref 11.0–14.6)
WBC: 5.7 10*3/uL (ref 4.0–10.3)
lymph#: 1.6 10*3/uL (ref 0.9–3.3)

## 2016-01-28 LAB — COMPREHENSIVE METABOLIC PANEL
ALBUMIN: 4 g/dL (ref 3.5–5.0)
ALK PHOS: 72 U/L (ref 40–150)
ALT: 14 U/L (ref 0–55)
ANION GAP: 9 meq/L (ref 3–11)
AST: 12 U/L (ref 5–34)
BILIRUBIN TOTAL: 0.5 mg/dL (ref 0.20–1.20)
BUN: 20.7 mg/dL (ref 7.0–26.0)
CALCIUM: 9.6 mg/dL (ref 8.4–10.4)
CO2: 24 mEq/L (ref 22–29)
Chloride: 106 mEq/L (ref 98–109)
Creatinine: 0.9 mg/dL (ref 0.7–1.3)
EGFR: 75 mL/min/{1.73_m2} — AB (ref >90)
Glucose: 91 mg/dl (ref 70–140)
POTASSIUM: 4.3 meq/L (ref 3.5–5.1)
SODIUM: 140 meq/L (ref 136–145)
TOTAL PROTEIN: 6.8 g/dL (ref 6.4–8.3)

## 2016-01-28 NOTE — Progress Notes (Signed)
Hematology and Oncology Follow Up Visit  HEMI SCHULENBURG [id number] 07-17-1932 82 y.o. 01/28/2016 3:14 PM [name],ROBERT [name], [name], MD   Principle Diagnosis: 82 year old gentleman diagnosed with prostate cancer in 1998 after presenting with a Gleason score of 9. 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   Prior Therapy: He did develop recurrent disease in October 2014 and required a channel TUR. He subsequently was started on firmagon which controlled his PSA reasonably well.  His most recently his PSA was up to 1.91 in June 2016 after a rise from 0.78 in March 2016. Before that was 0.53 in November 2015.   Current therapy:  Androgen deprivation. Administered at [facility] urology. Xtandi 160 mg daily started in August 2016. Therapy has been on hold since November 2016 due to poor tolerance.  Interim History: Mr. Farleigh is as today for a follow-up visit. Since the last visit, he continues to do very well. His quality of life remains reasonably maintained without any further decline. His appetite is excellent and his performance status is close to baseline. He has not reported any worsening back pain shoulder pain. Has not reported any recent hospitalization or illnesses. reports feeling back to his baseline. He denies any residual implications related to xtandi. He have returned to his baseline   He continues to have incontinence issues which have been chronic in nature. Despite not receiving any treatment for his prostate cancer he remains in stable clinical condition.  He does not report any headaches, blurry vision, syncope or seizures. He does not report any fevers, chills, sweats or weight loss. He is not reporting any chest pain, palpitation, orthopnea or leg edema. He does not report any cough or hemoptysis or hematemesis. He does not report any nausea, vomiting, abdominal pain, hematochezia. He does report incontinence but no hematuria, dysuria, nocturia. He does  not report any skeletal complaints. Remainder review of systems unremarkable.   Medications: I have reviewed the patient's current medications.  Current Outpatient Prescriptions  Medication Sig Dispense Refill  . acetaminophen (TYLENOL) 325 MG tablet Take 650 mg by mouth every 6 (six) hours as needed.    Marland Kitchen levothyroxine (SYNTHROID, LEVOTHROID) 150 MCG tablet Take 150 mcg by mouth daily before breakfast.    . meloxicam (MOBIC) 7.5 MG tablet Take 7.5 mg by mouth 2 (two) times daily.     Marland Kitchen MYRBETRIQ 50 MG TB24 tablet Take 50 mg by mouth daily.  0   No current facility-administered medications for this visit.     Allergies: No Known Allergies  Past Medical History, Surgical history, Social history, and Family History were reviewed and updated.   Physical Exam: Blood pressure 136/60, pulse 76, temperature 98.7 F (37.1 C), temperature source Oral, resp. rate 18, height 5\' 9"  (1.753 m), weight 229 lb 1.6 oz (103.919 kg), SpO2 99 %. ECOG: 1 General appearance: Well-appearing gentleman without distress.  Head: Normocephalic, without obvious abnormality no oral lesions or ulcers. Sclerae anicteric. Neck: no adenopathy Lymph nodes: Cervical, supraclavicular, and axillary nodes normal. Heart:regular rate and rhythm, S1, S2 normal, no murmur, click, rub or gallop Lung:chest clear, no wheezing, rales, normal symmetric air entry.  Abdomin: soft, non-tender, without masses or organomegaly no shifting dullness or ascites. EXT:no erythema, induration, or nodules   Lab Results: Lab Results  Component Value Date   WBC 5.7 01/28/2016   HGB 12.0* 01/28/2016   HCT 36.3* 01/28/2016   MCV 95.0 01/28/2016   PLT 164 01/28/2016     Chemistry  Component Value Date/Time   NA 140 11/26/2015 1427   NA 139 07/21/2013 0745   K 4.5 11/26/2015 1427   K 4.2 07/21/2013 0745   CL 103 07/21/2013 0745   CO2 26 11/26/2015 1427   CO2 28 07/21/2013 0745   BUN 23.8 11/26/2015 1427   BUN 25* 07/21/2013  0745   CREATININE 1.1 11/26/2015 1427   CREATININE 0.89 07/21/2013 0745      Component Value Date/Time   CALCIUM 9.8 11/26/2015 1427   CALCIUM 9.5 07/21/2013 0745   ALKPHOS 82 11/26/2015 1427   AST 16 11/26/2015 1427   ALT 12 11/26/2015 1427   BILITOT 0.58 11/26/2015 1427        Results for KMARI, PELL (MRN [id number]) as of 01/28/2016 15:08  Ref. Range 10/12/2015 13:18 10/12/2015 13:18 11/26/2015 14:26 11/26/2015 14:26  PSA Latest Ref Range: 0.0-4.0 ng/mL 0.15 0.1 0.42 0.4      Impression and Plan:   82 year old gentleman with the following issues:  1. Prostate cancer diagnosed in 1998 after presenting with a Gleason score 9 and received definitive therapy with seed implants. He developed recurrent disease in 2014 and have been on hormone therapy since 2015. He had a reasonable response initially and his PSA was up to 1.91 in June 2016 from 0.78 in March 2016. He is quite asymptomatic.   CT scan and bone scan obtained on 04/08/2015  Showed stable disease with increased uptake at T8 vertebra and lumbar spine which is suggestive of metastatic disease or possible arthritis. CT scan of the abdomen and pelvis did not show any visceral metastasis.  After brief treatment with Gillermina Phy, his PSA declined to 0.12 but tolerated the medication poorly. He is currently not receiving any treatment other than hormone therapy.  His last PSA Remains reasonably well currently at 0.4 and he is clinically asymptomatic. Risks and benefits of treating him with a different agent versus restarting Xtandi versus continued observation was discussed. He prefers to stay off treatment unless there is a reason to treat. We will await continued observation and surveillance and use salvage regimen upon symptomatic progression.  2. Hormone therapy: I have recommended continuing that for the time being. This will need to be continued indefinitely.  3. Fatigue and decline in his performance status: He is back to his  baseline.  4. Follow-up: Will be in 3 months.   Zola Button, MD 5/5/20173:14 PM

## 2016-01-28 NOTE — Telephone Encounter (Signed)
Gave pt appt & avs °

## 2016-01-29 LAB — PSA: PROSTATE SPECIFIC AG, SERUM: 0.9 ng/mL (ref 0.0–4.0)

## 2016-03-30 DIAGNOSIS — C61 Malignant neoplasm of prostate: Secondary | ICD-10-CM | POA: Diagnosis not present

## 2016-04-06 ENCOUNTER — Other Ambulatory Visit: Payer: Self-pay | Admitting: Urology

## 2016-04-06 DIAGNOSIS — N390 Urinary tract infection, site not specified: Secondary | ICD-10-CM | POA: Diagnosis not present

## 2016-04-06 DIAGNOSIS — N393 Stress incontinence (female) (male): Secondary | ICD-10-CM | POA: Diagnosis not present

## 2016-04-06 DIAGNOSIS — N3941 Urge incontinence: Secondary | ICD-10-CM | POA: Diagnosis not present

## 2016-04-06 DIAGNOSIS — C61 Malignant neoplasm of prostate: Secondary | ICD-10-CM

## 2016-04-06 DIAGNOSIS — C7919 Secondary malignant neoplasm of other urinary organs: Secondary | ICD-10-CM | POA: Diagnosis not present

## 2016-04-11 ENCOUNTER — Encounter: Payer: Self-pay | Admitting: *Deleted

## 2016-04-19 ENCOUNTER — Encounter (HOSPITAL_COMMUNITY)
Admission: RE | Admit: 2016-04-19 | Discharge: 2016-04-19 | Disposition: A | Discharge status: Home / Self Care | Payer: Medicare HMO | Source: Ambulatory Visit | Attending: Urology | Admitting: Urology

## 2016-04-19 DIAGNOSIS — R972 Elevated prostate specific antigen [PSA]: Secondary | ICD-10-CM | POA: Diagnosis not present

## 2016-04-19 DIAGNOSIS — C61 Malignant neoplasm of prostate: Secondary | ICD-10-CM | POA: Diagnosis not present

## 2016-04-19 MED ORDER — TECHNETIUM TC 99M MEDRONATE IV KIT: 25.0000 | PACK | Freq: Once | INTRAVENOUS | Status: DC | PRN

## 2016-04-26 DIAGNOSIS — C61 Malignant neoplasm of prostate: Secondary | ICD-10-CM | POA: Diagnosis not present

## 2016-04-26 DIAGNOSIS — N202 Calculus of kidney with calculus of ureter: Secondary | ICD-10-CM | POA: Diagnosis not present

## 2016-04-26 DIAGNOSIS — N390 Urinary tract infection, site not specified: Secondary | ICD-10-CM | POA: Diagnosis not present

## 2016-04-28 ENCOUNTER — Telehealth: Payer: Self-pay | Admitting: Oncology

## 2016-04-28 ENCOUNTER — Other Ambulatory Visit (HOSPITAL_BASED_OUTPATIENT_CLINIC_OR_DEPARTMENT_OTHER): Payer: Medicare HMO

## 2016-04-28 ENCOUNTER — Ambulatory Visit (HOSPITAL_BASED_OUTPATIENT_CLINIC_OR_DEPARTMENT_OTHER): Payer: Medicare HMO | Admitting: Oncology

## 2016-04-28 ENCOUNTER — Other Ambulatory Visit: Payer: Self-pay | Admitting: Urology

## 2016-04-28 VITALS — BP 140/60 | HR 84 | Temp 98.3°F | Resp 18 | Wt 235.5 lb

## 2016-04-28 DIAGNOSIS — C61 Malignant neoplasm of prostate: Secondary | ICD-10-CM

## 2016-04-28 DIAGNOSIS — R5383 Other fatigue: Secondary | ICD-10-CM | POA: Diagnosis not present

## 2016-04-28 DIAGNOSIS — E291 Testicular hypofunction: Secondary | ICD-10-CM

## 2016-04-28 DIAGNOSIS — C7951 Secondary malignant neoplasm of bone: Secondary | ICD-10-CM | POA: Diagnosis not present

## 2016-04-28 LAB — COMPREHENSIVE METABOLIC PANEL
ALBUMIN: 4.2 g/dL (ref 3.5–5.0)
ALK PHOS: 79 U/L (ref 40–150)
ALT: 15 U/L (ref 0–55)
ANION GAP: 9 meq/L (ref 3–11)
AST: 14 U/L (ref 5–34)
BILIRUBIN TOTAL: 0.52 mg/dL (ref 0.20–1.20)
BUN: 24.7 mg/dL (ref 7.0–26.0)
CALCIUM: 9.9 mg/dL (ref 8.4–10.4)
CO2: 28 mEq/L (ref 22–29)
CREATININE: 1 mg/dL (ref 0.7–1.3)
Chloride: 105 mEq/L (ref 98–109)
EGFR: 72 mL/min/{1.73_m2} — ABNORMAL LOW (ref >90)
Glucose: 96 mg/dl (ref 70–140)
Potassium: 4.4 mEq/L (ref 3.5–5.1)
Sodium: 142 mEq/L (ref 136–145)
Total Protein: 7.2 g/dL (ref 6.4–8.3)

## 2016-04-28 LAB — CBC WITH DIFFERENTIAL/PLATELET
BASO%: 0.6 % (ref 0.0–2.0)
BASOS ABS: 0 10*3/uL (ref 0.0–0.1)
EOS%: 4.2 % (ref 0.0–7.0)
Eosinophils Absolute: 0.3 10*3/uL (ref 0.0–0.5)
HEMATOCRIT: 38.8 % (ref 38.4–49.9)
HEMOGLOBIN: 12.8 g/dL — AB (ref 13.0–17.1)
LYMPH#: 1.7 10*3/uL (ref 0.9–3.3)
LYMPH%: 24.4 % (ref 14.0–49.0)
MCH: 31.1 pg (ref 27.2–33.4)
MCHC: 33 g/dL (ref 32.0–36.0)
MCV: 94.2 fL (ref 79.3–98.0)
MONO#: 0.5 10*3/uL (ref 0.1–0.9)
MONO%: 7.7 % (ref 0.0–14.0)
NEUT#: 4.4 10*3/uL (ref 1.5–6.5)
NEUT%: 63.1 % (ref 39.0–75.0)
PLATELETS: 176 10*3/uL (ref 140–400)
RBC: 4.12 10*6/uL — ABNORMAL LOW (ref 4.20–5.82)
RDW: 14.4 % (ref 11.0–14.6)
WBC: 7 10*3/uL (ref 4.0–10.3)

## 2016-04-28 NOTE — Progress Notes (Signed)
Hematology and Oncology Follow Up Visit  IZAACK PHILLIPP [id number] 1931-11-06 82 y.o. 04/28/2016 3:09 PM [name],ROBERT [name], [name], MD   Principle Diagnosis: 82 year old gentleman diagnosed with prostate cancer in 1998 after presenting with a Gleason score of 9. 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   Prior Therapy: He did develop recurrent disease in October 2014 and required a channel TUR. He subsequently was started on firmagon which controlled his PSA reasonably well.    PSA was up to 1.91 in June 2016 after a rise from 0.78 in March 2016. Before that was 0.53 in November 2015.   Xtandi 160 mg daily started in August 2016. Therapy has been on hold since November 2016 due to poor tolerance. He had an excellent response with PSA dropping to 0.1.  Current therapy:  Androgen deprivation. Administered at [facility] urology.   Interim History: Mr. Sulcer is as today for a follow-up visit. Since the last visit, he reports no major changes in his health. He denied any recent complaints of back pain, pathological fractures or worsening urinary symptoms. His quality of life remains reasonably maintained without any further decline.   His appetite is excellent and have gained more weight since last visit. His performance status is close to baseline. He continues to have incontinence issues which have been chronic in nature. His quality of life is unchanged from previous examination.  He does not report any headaches, blurry vision, syncope or seizures. He does not report any fevers, chills, sweats or weight loss. He is not reporting any chest pain, palpitation, orthopnea or leg edema. He does not report any cough or hemoptysis or hematemesis. He does not report any nausea, vomiting, abdominal pain, hematochezia. He does report incontinence but no hematuria, dysuria, nocturia. He does not report any skeletal complaints. Remainder review of systems unremarkable.    Medications: I have reviewed the patient's current medications.  Current Outpatient Prescriptions  Medication Sig Dispense Refill  . acetaminophen (TYLENOL) 325 MG tablet Take 650 mg by mouth every 6 (six) hours as needed.    Marland Kitchen levothyroxine (SYNTHROID, LEVOTHROID) 150 MCG tablet Take 150 mcg by mouth daily before breakfast.    . meloxicam (MOBIC) 7.5 MG tablet Take 7.5 mg by mouth 2 (two) times daily.     Marland Kitchen MYRBETRIQ 50 MG TB24 tablet Take 50 mg by mouth daily.  0   No current facility-administered medications for this visit.      Allergies: No Known Allergies  Past Medical History, Surgical history, Social history, and Family History were reviewed and updated.   Physical Exam: Blood pressure 140/60, pulse 84, temperature 98.3 F (36.8 C), temperature source Oral, resp. rate 18, weight 235 lb 8 oz (106.8 kg), SpO2 100 %. ECOG: 1 General appearance: Alert, awake gentleman without distress. Head: Normocephalic, without obvious abnormality no oral thrush noted. Neck: no adenopathy or masses. Lymph nodes: Cervical, supraclavicular, and axillary nodes normal. Heart:regular rate and rhythm, S1, S2 normal, no murmur, click, rub or gallop Lung:chest clear, no wheezing, rales, normal symmetric air entry.  Abdomin: soft, non-tender, without masses or organomegaly no rebound or guarding. EXT:no erythema, induration, or nodules   Lab Results: Lab Results  Component Value Date   WBC 7.0 04/28/2016   HGB 12.8 (L) 04/28/2016   HCT 38.8 04/28/2016   MCV 94.2 04/28/2016   PLT 176 04/28/2016     Chemistry      Component Value Date/Time   NA 140 01/28/2016 1443   K 4.3  01/28/2016 1443   CL 103 07/21/2013 0745   CO2 24 01/28/2016 1443   BUN 20.7 01/28/2016 1443   CREATININE 0.9 01/28/2016 1443      Component Value Date/Time   CALCIUM 9.6 01/28/2016 1443   ALKPHOS 72 01/28/2016 1443   AST 12 01/28/2016 1443   ALT 14 01/28/2016 1443   BILITOT 0.50 01/28/2016 1443           Results for SPENSER, MOCZYGEMBA (MRN [id number]) as of 04/28/2016 14:49  Ref. Range 11/26/2015 14:26 11/26/2015 14:26 01/28/2016 14:43  PSA Latest Ref Range: 0.0 - 4.0 ng/mL 0.42 0.4 0.9    CLINICAL DATA:  History of prostate cancer. No trauma and no current pain. PSA 0.65 on 12/27/2015. EXAM: NUCLEAR MEDICINE WHOLE BODY BONE SCAN TECHNIQUE: Whole body anterior and posterior images were obtained approximately 3 hours after intravenous injection of radiopharmaceutical. RADIOPHARMACEUTICALS:  25.5 mCi Technetium-67m MDP IV COMPARISON:  04/08/2015 and 05/25/2010 as well as CT 04/19/2016 and 04/08/2015 FINDINGS: Examination demonstrates normal distribution of radiotracer within the bones and soft tissues as well as urinary system. Persistent horizontal uptake over the approximate T8-9 level compatible with known compression fracture unchanged. Patchy uptake over the lower lumbar spine compatible with degenerative changes and stable. Mild symmetric uptake over the shoulders, knees and ankles/feet which is stable and compatible degenerative changes. No new areas of osseous uptake suspicious for metastatic disease. Evidence of mild delayed excretion of radiotracer by the left kidney with minimal prominence of the left intrarenal collecting system and ureter. Findings are compatible with 6 mm stone seen over the distal left ureter on recent CT. IMPRESSION: No definite evidence of osseous metastatic disease. Known stable compression fracture over the T8-9 level. Stable patchy uptake over the lower lumbar spine compatible degenerative changes. Symmetric degenerative changes over the appendicular skeleton. Delayed excretion with dilatation of the left intrarenal collecting system and ureter compatible with known 6 mm distal left ureteral stone seen on recent CT today. Electronically Signed  Impression and Plan:   82 year old gentleman with the following issues:  1. Prostate cancer  diagnosed in 1998 after presenting with a Gleason score 9 and received definitive therapy with seed implants. He developed recurrent disease in 2014 and have been on hormone therapy since 2015. He had a reasonable response initially and his PSA was up to 1.91 in June 2016 from 0.78 in March 2016. He is quite asymptomatic.   CT scan and bone scan obtained on 04/08/2015  showed stable disease with increased uptake at T8 vertebra and lumbar spine which is suggestive of metastatic disease or possible arthritis. CT scan of the abdomen and pelvis did not show any visceral metastasis.  After brief treatment with Gillermina Phy, his PSA declined to 0.12 but tolerated the medication poorly. He is currently not receiving any treatment other than hormone therapy.  His last PSA continues to be less than 1 and bone scan on 04/19/2016 was reviewed and showed no convincing evidence of metastatic disease. The plan is to continue with androgen deprivation only and uses different salvage therapy upon symptomatic progression. He have tolerated Xtandi poorly and elected to defer any treatment unless absolutely needed to.  2. Hormone therapy: I have recommended continuing that for the time being. This will need to be continued indefinitely.  3. Fatigue and decline in his performance status: This have resolved at this time since discontinuation of Xtandi.  4. Follow-up: Will be in 3 months.   [facility], MD 8/4/20173:09 PM

## 2016-04-28 NOTE — Telephone Encounter (Signed)
Gve pt appt for 07/28/16.

## 2016-04-29 LAB — PSA: PROSTATE SPECIFIC AG, SERUM: 3.4 ng/mL (ref 0.0–4.0)

## 2016-05-01 ENCOUNTER — Observation Stay (HOSPITAL_COMMUNITY)
Admission: AD | Admit: 2016-05-01 | Discharge: 2016-05-02 | Disposition: A | Discharge status: Home / Self Care | Payer: Medicare HMO | Source: Ambulatory Visit | Attending: Urology | Admitting: Urology

## 2016-05-01 ENCOUNTER — Ambulatory Visit (HOSPITAL_COMMUNITY): Payer: Medicare HMO | Admitting: Certified Registered Nurse Anesthetist

## 2016-05-01 ENCOUNTER — Other Ambulatory Visit: Payer: Self-pay | Admitting: Urology

## 2016-05-01 ENCOUNTER — Encounter (HOSPITAL_COMMUNITY): Payer: Self-pay | Admitting: *Deleted

## 2016-05-01 ENCOUNTER — Encounter (HOSPITAL_COMMUNITY)
Admission: AD | Disposition: A | Discharge status: Home / Self Care | Payer: Self-pay | Source: Ambulatory Visit | Attending: Urology

## 2016-05-01 DIAGNOSIS — N201 Calculus of ureter: Secondary | ICD-10-CM | POA: Diagnosis not present

## 2016-05-01 DIAGNOSIS — Z8546 Personal history of malignant neoplasm of prostate: Secondary | ICD-10-CM | POA: Diagnosis not present

## 2016-05-01 DIAGNOSIS — M1990 Unspecified osteoarthritis, unspecified site: Secondary | ICD-10-CM | POA: Insufficient documentation

## 2016-05-01 DIAGNOSIS — E039 Hypothyroidism, unspecified: Secondary | ICD-10-CM | POA: Diagnosis not present

## 2016-05-01 DIAGNOSIS — Z79899 Other long term (current) drug therapy: Secondary | ICD-10-CM | POA: Insufficient documentation

## 2016-05-01 DIAGNOSIS — Z791 Long term (current) use of non-steroidal anti-inflammatories (NSAID): Secondary | ICD-10-CM | POA: Diagnosis not present

## 2016-05-01 DIAGNOSIS — N210 Calculus in bladder: Secondary | ICD-10-CM | POA: Diagnosis present

## 2016-05-01 DIAGNOSIS — I10 Essential (primary) hypertension: Secondary | ICD-10-CM | POA: Diagnosis not present

## 2016-05-01 DIAGNOSIS — N420 Calculus of prostate: Secondary | ICD-10-CM | POA: Diagnosis not present

## 2016-05-01 DIAGNOSIS — N136 Pyonephrosis: Secondary | ICD-10-CM | POA: Diagnosis not present

## 2016-05-01 DIAGNOSIS — N1330 Unspecified hydronephrosis: Secondary | ICD-10-CM | POA: Diagnosis not present

## 2016-05-01 DIAGNOSIS — N289 Disorder of kidney and ureter, unspecified: Secondary | ICD-10-CM

## 2016-05-01 DIAGNOSIS — N132 Hydronephrosis with renal and ureteral calculous obstruction: Secondary | ICD-10-CM | POA: Diagnosis not present

## 2016-05-01 DIAGNOSIS — J309 Allergic rhinitis, unspecified: Secondary | ICD-10-CM | POA: Diagnosis not present

## 2016-05-01 HISTORY — PX: CYSTOSCOPY W/ URETERAL STENT PLACEMENT: SHX1429

## 2016-05-01 HISTORY — PX: HOLMIUM LASER APPLICATION: SHX5852

## 2016-05-01 LAB — CBC WITH DIFFERENTIAL/PLATELET
BASOS PCT: 0 %
Basophils Absolute: 0 10*3/uL (ref 0.0–0.1)
EOS ABS: 0.1 10*3/uL (ref 0.0–0.7)
Eosinophils Relative: 1 %
HCT: 32.6 % — ABNORMAL LOW (ref 39.0–52.0)
HEMOGLOBIN: 10.7 g/dL — AB (ref 13.0–17.0)
Lymphocytes Relative: 11 %
Lymphs Abs: 1 10*3/uL (ref 0.7–4.0)
MCH: 31.3 pg (ref 26.0–34.0)
MCHC: 32.8 g/dL (ref 30.0–36.0)
MCV: 95.3 fL (ref 78.0–100.0)
MONO ABS: 0.8 10*3/uL (ref 0.1–1.0)
MONOS PCT: 9 %
NEUTROS PCT: 79 %
Neutro Abs: 7.3 10*3/uL (ref 1.7–7.7)
Platelets: 110 10*3/uL — ABNORMAL LOW (ref 150–400)
RBC: 3.42 MIL/uL — ABNORMAL LOW (ref 4.22–5.81)
RDW: 13.9 % (ref 11.5–15.5)
WBC: 9.2 10*3/uL (ref 4.0–10.5)

## 2016-05-01 LAB — COMPREHENSIVE METABOLIC PANEL
ALT: 15 U/L — ABNORMAL LOW (ref 17–63)
ANION GAP: 7 (ref 5–15)
AST: 18 U/L (ref 15–41)
Albumin: 3.7 g/dL (ref 3.5–5.0)
Alkaline Phosphatase: 53 U/L (ref 38–126)
BUN: 45 mg/dL — ABNORMAL HIGH (ref 6–20)
CHLORIDE: 99 mmol/L — AB (ref 101–111)
CO2: 27 mmol/L (ref 22–32)
Calcium: 8.7 mg/dL — ABNORMAL LOW (ref 8.9–10.3)
Creatinine, Ser: 1.92 mg/dL — ABNORMAL HIGH (ref 0.61–1.24)
GFR calc non Af Amer: 31 mL/min — ABNORMAL LOW (ref >60)
GFR, EST AFRICAN AMERICAN: 36 mL/min — AB (ref >60)
Glucose, Bld: 100 mg/dL — ABNORMAL HIGH (ref 65–99)
POTASSIUM: 4 mmol/L (ref 3.5–5.1)
SODIUM: 133 mmol/L — AB (ref 135–145)
Total Bilirubin: 1.1 mg/dL (ref 0.3–1.2)
Total Protein: 6.5 g/dL (ref 6.5–8.1)

## 2016-05-01 LAB — LACTIC ACID, PLASMA: LACTIC ACID, VENOUS: 1 mmol/L (ref 0.5–1.9)

## 2016-05-01 SURGERY — CYSTOSCOPY, WITH RETROGRADE PYELOGRAM AND URETERAL STENT INSERTION
Anesthesia: General

## 2016-05-01 MED ORDER — EPHEDRINE SULFATE 50 MG/ML IJ SOLN
INTRAMUSCULAR | Status: DC | PRN
  Administered 2016-05-01: 5 mg via INTRAVENOUS
  Administered 2016-05-01: 10 mg via INTRAVENOUS

## 2016-05-01 MED ORDER — SENNOSIDES-DOCUSATE SODIUM 8.6-50 MG PO TABS: 1.0000 | ORAL_TABLET | Freq: Every evening | ORAL | Status: DC | PRN

## 2016-05-01 MED ORDER — FLEET ENEMA 7-19 GM/118ML RE ENEM: 1.0000 | ENEMA | Freq: Once | RECTAL | Status: DC | PRN

## 2016-05-01 MED ORDER — LACTATED RINGERS IV SOLN
INTRAVENOUS | Status: DC
  Administered 2016-05-01: 17:00:00 via INTRAVENOUS

## 2016-05-01 MED ORDER — DOCUSATE SODIUM 100 MG PO CAPS
100.0000 mg | ORAL_CAPSULE | Freq: Two times a day (BID) | ORAL | Status: DC
  Administered 2016-05-02 (×2): 100 mg via ORAL
  Filled 2016-05-01 (×2): qty 1

## 2016-05-01 MED ORDER — LIDOCAINE HCL (CARDIAC) 20 MG/ML IV SOLN
INTRAVENOUS | Status: AC
  Filled 2016-05-01: qty 5

## 2016-05-01 MED ORDER — GENTAMICIN SULFATE 40 MG/ML IJ SOLN
5.0000 mg/kg | INTRAVENOUS | Status: AC
  Administered 2016-05-01: 410 mg via INTRAVENOUS
  Filled 2016-05-01: qty 10.25

## 2016-05-01 MED ORDER — ZOLPIDEM TARTRATE 5 MG PO TABS: 5.0000 mg | ORAL_TABLET | Freq: Every evening | ORAL | Status: DC | PRN

## 2016-05-01 MED ORDER — BISACODYL 10 MG RE SUPP: 10.0000 mg | Freq: Every day | RECTAL | Status: DC | PRN

## 2016-05-01 MED ORDER — ONDANSETRON HCL 4 MG/2ML IJ SOLN
INTRAMUSCULAR | Status: AC
  Filled 2016-05-01: qty 2

## 2016-05-01 MED ORDER — KCL IN DEXTROSE-NACL 20-5-0.45 MEQ/L-%-% IV SOLN
INTRAVENOUS | Status: DC
  Administered 2016-05-02: via INTRAVENOUS
  Filled 2016-05-01 (×2): qty 1000

## 2016-05-01 MED ORDER — PROMETHAZINE HCL 25 MG/ML IJ SOLN: 6.2500 mg | INTRAMUSCULAR | Status: DC | PRN

## 2016-05-01 MED ORDER — PIPERACILLIN-TAZOBACTAM 3.375 G IVPB
3.3750 g | Freq: Three times a day (TID) | INTRAVENOUS | Status: DC
  Administered 2016-05-02 (×2): 3.375 g via INTRAVENOUS
  Filled 2016-05-01 (×2): qty 50

## 2016-05-01 MED ORDER — DIPHENHYDRAMINE HCL 12.5 MG/5ML PO ELIX: 12.5000 mg | ORAL_SOLUTION | Freq: Four times a day (QID) | ORAL | Status: DC | PRN

## 2016-05-01 MED ORDER — FENTANYL CITRATE (PF) 100 MCG/2ML IJ SOLN
INTRAMUSCULAR | Status: AC
  Filled 2016-05-01: qty 2

## 2016-05-01 MED ORDER — HYDROMORPHONE HCL 1 MG/ML IJ SOLN: 0.2500 mg | INTRAMUSCULAR | Status: DC | PRN

## 2016-05-01 MED ORDER — MEPERIDINE HCL 50 MG/ML IJ SOLN: 6.2500 mg | INTRAMUSCULAR | Status: DC | PRN

## 2016-05-01 MED ORDER — ACETAMINOPHEN 325 MG PO TABS: 650.0000 mg | ORAL_TABLET | Freq: Four times a day (QID) | ORAL | Status: DC | PRN

## 2016-05-01 MED ORDER — FENTANYL CITRATE (PF) 100 MCG/2ML IJ SOLN
INTRAMUSCULAR | Status: DC | PRN
  Administered 2016-05-01: 50 ug via INTRAVENOUS

## 2016-05-01 MED ORDER — MELOXICAM 15 MG PO TABS
15.0000 mg | ORAL_TABLET | Freq: Every day | ORAL | Status: DC
  Administered 2016-05-02 (×2): 15 mg via ORAL
  Filled 2016-05-01 (×2): qty 1

## 2016-05-01 MED ORDER — LACTATED RINGERS IV SOLN: INTRAVENOUS | Status: DC

## 2016-05-01 MED ORDER — ONDANSETRON HCL 4 MG/2ML IJ SOLN
4.0000 mg | INTRAMUSCULAR | Status: DC | PRN
Start: 2016-05-01 — End: 2016-05-02

## 2016-05-01 MED ORDER — PROPOFOL 10 MG/ML IV BOLUS
INTRAVENOUS | Status: AC
  Filled 2016-05-01: qty 20

## 2016-05-01 MED ORDER — DIPHENHYDRAMINE HCL 50 MG/ML IJ SOLN: 12.5000 mg | Freq: Four times a day (QID) | INTRAMUSCULAR | Status: DC | PRN

## 2016-05-01 MED ORDER — LEVOTHYROXINE SODIUM 50 MCG PO TABS
150.0000 ug | ORAL_TABLET | Freq: Every day | ORAL | Status: DC
  Administered 2016-05-02: 150 ug via ORAL
  Filled 2016-05-01: qty 1

## 2016-05-01 MED ORDER — ONDANSETRON HCL 4 MG/2ML IJ SOLN
INTRAMUSCULAR | Status: DC | PRN
Start: 2016-05-01 — End: 2016-05-01
  Administered 2016-05-01: 4 mg via INTRAVENOUS

## 2016-05-01 MED ORDER — SODIUM CHLORIDE 0.9 % IR SOLN
Status: DC | PRN
  Administered 2016-05-01: 4000 mL

## 2016-05-01 MED ORDER — VANCOMYCIN HCL IN DEXTROSE 1-5 GM/200ML-% IV SOLN
1000.0000 mg | INTRAVENOUS | Status: AC
  Administered 2016-05-01: 1000 mg via INTRAVENOUS
  Filled 2016-05-01: qty 200

## 2016-05-01 MED ORDER — HYDROCODONE-ACETAMINOPHEN 5-325 MG PO TABS: 1.0000 | ORAL_TABLET | ORAL | Status: DC | PRN

## 2016-05-01 MED ORDER — HYDROMORPHONE HCL 1 MG/ML IJ SOLN
0.5000 mg | INTRAMUSCULAR | Status: DC | PRN
Start: 2016-05-01 — End: 2016-05-02
  Administered 2016-05-02: 1 mg via INTRAVENOUS
  Filled 2016-05-01: qty 1

## 2016-05-01 MED ORDER — IOHEXOL 300 MG/ML  SOLN
INTRAMUSCULAR | Status: DC | PRN
  Administered 2016-05-01: 10 mL via URETHRAL

## 2016-05-01 MED ORDER — MIRABEGRON ER 50 MG PO TB24
50.0000 mg | ORAL_TABLET | Freq: Every day | ORAL | Status: DC
  Administered 2016-05-02: 50 mg via ORAL
  Filled 2016-05-01: qty 1

## 2016-05-01 MED ORDER — LIDOCAINE HCL (CARDIAC) 20 MG/ML IV SOLN
INTRAVENOUS | Status: DC | PRN
  Administered 2016-05-01: 100 mg via INTRAVENOUS

## 2016-05-01 MED ORDER — PROPOFOL 10 MG/ML IV BOLUS
INTRAVENOUS | Status: DC | PRN
  Administered 2016-05-01: 150 mg via INTRAVENOUS

## 2016-05-01 MED ORDER — IOPAMIDOL (ISOVUE-300) INJECTION 61%
INTRAVENOUS | Status: AC
  Filled 2016-05-01: qty 50

## 2016-05-01 SURGICAL SUPPLY — 14 items
BAG URO CATCHER STRL LF (MISCELLANEOUS) ×3 IMPLANT
CATH TIEMANN FOLEY 18FR 5CC (CATHETERS) ×1 IMPLANT
CATH URET 5FR 28IN OPEN ENDED (CATHETERS) ×1 IMPLANT
CLOTH BEACON ORANGE TIMEOUT ST (SAFETY) ×3 IMPLANT
FIBER LASER FLEXIVA 1000 (UROLOGICAL SUPPLIES) ×1 IMPLANT
GLOVE SURG SS PI 8.0 STRL IVOR (GLOVE) ×3 IMPLANT
GOWN STRL REUS W/TWL XL LVL3 (GOWN DISPOSABLE) ×4 IMPLANT
GUIDEWIRE ANG ZIPWIRE 038X150 (WIRE) ×1 IMPLANT
GUIDEWIRE STR DUAL SENSOR (WIRE) ×3 IMPLANT
MANIFOLD NEPTUNE II (INSTRUMENTS) ×3 IMPLANT
PACK CYSTO (CUSTOM PROCEDURE TRAY) ×3 IMPLANT
STENT URET 6FRX26 CONTOUR (STENTS) ×1 IMPLANT
SYRINGE IRR TOOMEY STRL 70CC (SYRINGE) ×1 IMPLANT
TUBING CONNECTING 10 (TUBING) ×3 IMPLANT

## 2016-05-01 NOTE — Anesthesia Procedure Notes (Signed)
Procedure Name: LMA Insertion Date/Time: 05/01/2016 6:38 PM Performed by: Carleene Cooper A Pre-anesthesia Checklist: Patient identified, Emergency Drugs available, Suction available, Patient being monitored and Timeout performed Patient Re-evaluated:Patient Re-evaluated prior to inductionOxygen Delivery Method: Circle system utilized Preoxygenation: Pre-oxygenation with 100% oxygen Intubation Type: IV induction LMA: LMA with gastric port inserted LMA Size: 5.0 Number of attempts: 1 Placement Confirmation: positive ETCO2 and breath sounds checked- equal and bilateral Tube secured with: Tape Dental Injury: Teeth and Oropharynx as per pre-operative assessment

## 2016-05-01 NOTE — H&P (Signed)
CC: I have ureteral stone.  HPI: The problem is on the left side. He first stated noticing pain on 05/01/2016. This is not his first kidney stone. He is currently having fever and chills. He denies having flank pain, back pain, groin pain, nausea, and vomiting. Pain is occuring on the left side. He has not caught a stone in his urine strainer since his symptoms began.   He has had ureteral stent and ureteroscopy for treatment of his stones in the past.   [name] [name] returns today following an episode of chills and fever to 100.4 that was associated with left flank pain. He has a 53mm stone on recent CT in the left distal ureter with hydro and he had multiple species on his culture from last week. He is afebrile now and feeling better but an [place] today still shows moderate hydronephrosis. He has stable incontinence and no hematuria.      ALLERGIES: No Allergies    MEDICATIONS: Myrbetriq 50 mg tablet, extended release 24 hr  Allegra Allergy  Caffeine  Meloxicam  Synthroid     GU PSH: Biopsy Skin Lesion - 2008 Cysto Uretero Lithotripsy - 2015 Cystoscopy And Treatment - 2012 Cystoscopy And Treatment - 2012 Cystoscopy Insert Stent - 2015 Locm 300-399Mg /Ml Iodine,1Ml - 04/19/2016 PLACE RT DEVICE/MARKER, PROS - 2008 Renal ESWL - 2008 Revise Bladder Neck - 2014      PSH Notes: Cystoscopy With Ureteroscopy With Lithotripsy, Cystoscopy With Insertion Of Ureteral Stent Left, Transurethral Resection Of Bladder Neck, Cystoscopy With Removal Of Ureteral Calculus Right, Cystoscopy, Ureteral Meatotomy, Treat Ureterocele Right, Lithotripsy, Prostate Place Interstitial Dev For Radiation Guide Multiple, Biopsy Skin, Leg Repair   NON-GU PSH: None   GU PMH: Kidney Stone, He has bilateral stones, right > left. - 04/26/2016, Kidney Stone, Bilateral kidney stones - 01/03/2016 Prostate Cancer (Worsening), The restaging studies showed no obvious mets despite the CRCP with rising psa. 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 - 04/06/2016 Rising PSA, Post Treatment - 04/06/2016 Urinary incontinence, Unspec - 04/06/2016 Urge incontinence, Urge incontinence of urine - 01/03/2016 Urinary Tract Inf, Unspec site, Urinary tract infection - 01/03/2016, Pyuria, - 12/09/2014 Stress Incontinence, M/F, Male stress incontinence - 10/04/2015, Male Stress Incontinence, - 2014 Hydronephrosis Unspec, Hydronephrosis, left - 2015 Calculus Ureter, Calculus of ureter - 2015 Elevated PSA, Elevated prostate specific antigen (PSA) - 2014 Bladder-neck obstruction, Bladder neck contracture - 2014 ED, arterial insufficiency, Erectile dysfunction due to arterial insufficiency - 2014 Gross hematuria, Gross Hematuria - 2014 History of prostate cancer, Prostate Cancer - 2014 Other microscopic hematuria, Microscopic Hematuria - 2014 Personal Hx urinary calculi, Nephrolithiasis - 2014 Polyuria, Polyuria - 2014      PMH Notes:  2007-10-25 16:14:38 - Note: Urinary Tract Infection  2006-12-11 15:58:45 - Note: Frequent, Small Amounts Of Urine  2006-10-12 14:13:58 - Note: Skin Cancer   NON-GU PMH: Other fatigue, Fatigue - 06/30/2015 Encounter for general adult medical examination without abnormal findings, Encounter for preventive health examination - 12/09/2014, Routine history and physical examination of adult, - 2014 Cellulitis of abdominal wall, Cellulitis of right abdominal wall - 08/24/2014 Cardiac murmur, unspecified, Murmurs - 2014 Muscle weakness (generalized), Muscle weakness - 2014 Other lack of coordination, Other lack of coordination - 2014 Personal history of other endocrine, nutritional and metabolic disease, History of hypothyroidism - 2014    FAMILY HISTORY: Cancer - Runs In Family Death In The Family Father - Father Death  In The Family Mother - Mother Diabetes - Runs In Family Family Health Status Number - Runs In Family    SOCIAL HISTORY: Marital Status: Married Current Smoking Status: Patient has never smoked.  Has never drank.  Does not drink caffeine.     Notes: Exercise Habits, Activities Of Daily Living, Self-reliant In Usual Daily Activities, Living Independently With Spouse, Drug Use, Never A Smoker, Tobacco Use, Occupation:, Alcohol Use, Caffeine Use, Marital History - Currently Married   REVIEW OF SYSTEMS:    GU Review Male:   Patient reports frequent urination, get up at night to urinate, and leakage of urine. Patient denies hard to postpone urination, burning/ pain with urination, stream starts and stops, trouble starting your stream, have to strain to urinate , erection problems, and penile pain.  Gastrointestinal (Upper):   Patient denies nausea, vomiting, and indigestion/ heartburn.  Gastrointestinal (Lower):   Patient reports diarrhea. Patient denies constipation.  Constitutional:   Patient reports fever, night sweats, and fatigue. Patient denies weight loss.  Skin:   Patient denies skin rash/ lesion and itching.  Eyes:   Patient denies blurred vision and double vision.  Ears/ Nose/ Throat:   Patient denies sore throat and sinus problems.  Hematologic/Lymphatic:   Patient reports easy bruising. Patient denies swollen glands.  Cardiovascular:   Patient denies leg swelling and chest pains.  Respiratory:   Patient denies cough and shortness of breath.  Endocrine:   Patient denies excessive thirst.  Musculoskeletal:   Patient reports back pain and joint pain.   Neurological:   Patient denies headaches and dizziness.  Psychologic:   Patient denies depression and anxiety.   VITAL SIGNS:      05/01/2016 11:32 AM  Weight 225 lb / 102.06 kg  BP 108/67 mmHg  Pulse 70 /min  Temperature 98.3 F / 37 C   MULTI-SYSTEM PHYSICAL EXAMINATION:    Constitutional: Well-nourished. No physical deformities. Normally developed. Good grooming.  Respiratory: No labored breathing, no use of accessory muscles.  CTA  Cardiovascular: Normal temperature, normal extremity pulses, no swelling, no varicosities. RRR without murmur.  Skin: No paleness, no jaundice, no cyanosis. No lesion, no ulcer, no rash.   Gastrointestinal: Obese abdomen. No mass, no tenderness, no rigidity.      PAST DATA REVIEWED:  Source Of History:  Patient  X-Ray Review: Renal Ultrasound (Limited): Reviewed Films. Left hydro persists    03/30/16 01/28/16 12/28/15 09/29/15 06/24/15 03/19/15 12/09/14 12/03/14  PSA  Total PSA 2.38 ng/dl 0.9 ng/dl 0.65  0.12  0.23  1.91  0.77  0.78     PROCEDURES:         Renal Ultrasound (Limited) - [id number]  Left Kidney: Length: 13.88 cm Depth: 5.89 cm Cortical Width: 1.42 cm Width: 6.63cm    Left Kidney/Ureter:  1)Moderate Hydro with Dilated Renal Pelvis measuring 3.17cm and Dilated Proximal Ureter with measurements of Upper to Mid ureter measuring 1.65cm, 1.26cm, and 0.95cm-----2)Stones - Upper Pole 0.94cm, Mid Pole 0.48cm, and Lower Pole 0.66cm and 0.69cm  Bladder:  PVR = 46.52ml---Unable to visualize either ureteral jet      He still has left hydro and possible renal stones.   Poor Fine Detail due to Body Habitus         Urinalysis w/Scope - 81001 Dipstick Dipstick Cont'd Micro  Specimen: Voided Bilirubin: Neg WBC/hpf: 20-40/hpf  Color: Yellow Ketones: Neg RBC/hpf: 20-40/hpf  Appearance: Cloudy Blood: 3+ Bacteria: Many (>50/hpf)  Specific Gravity: 1.020 Protein: 1+ Cystals: NS (Not Seen)  pH: 6.0  Urobilinogen: 0.2 Casts: NS (Not Seen)  Glucose: Neg Nitrites: Neg Trichomonas: Not Present    Leukocyte Esterase: 3+ Mucous: Not Present      Epithelial Cells: 0-5/hpf      Yeast: NS (Not Seen)      Sperm: Not Present    ASSESSMENT:      ICD-10 Details  1 GU:   Calculus Ureter - N20.1 Stable  2   Urinary Tract Inf, Unspec site - N39.0 Worsening   PLAN:           Orders X-Rays: Renal Ultrasound (Limited) - Left renal US today assess for hydro.          Schedule Return Visit:  ASAP - Schedule Surgery             Note: Needs cystoscopy and left stent today.         Notes:   He had fever and chills and persistent left hydro with a known left distal stone.   I am going to get him set up for placement of a left ureteral stent today and reviewed the risks of bleeding, infection, ureteral injury, need for secondary procedures, thrombotic events and anesthetic complications. He will be given Vanc and Norva Karvonen for prophyllaxis.   CC: Dr. Maury Dus.

## 2016-05-01 NOTE — Brief Op Note (Signed)
05/01/2016  7:21 PM  PATIENT:  [name] [name]  82 y.o. male  PRE-OPERATIVE DIAGNOSIS:  left hydro nephrosis with stone and infection.   POST-OPERATIVE DIAGNOSIS:  left pyonephrosis and >2.5cm bladder stones.  PROCEDURE:  Procedure(s): CYSTOSCOPY WITH LEFT RETROGRADE PYELOGRAM/URETERAL LEFT STENT PLACEMENT (Left) HOLMIUM LASER APPLICATION (N/A)  FIRST STAGE >2.5cm CYSTOLITHALOPAXY  SURGEON:  Surgeon(s) and Role:    * [name], MD - Primary  PHYSICIAN ASSISTANT:   ASSISTANTS: none   ANESTHESIA:   general  EBL:  Total I/O In: -  Out: 5 [Blood:5]  BLOOD ADMINISTERED:none  DRAINS: Urinary Catheter (Foley) and 6 x 26 left JJ stent   LOCAL MEDICATIONS USED:  NONE  SPECIMEN:  Source of Specimen:  culture from left renal pelvis and stone fragments.   DISPOSITION OF SPECIMEN:  PATHOLOGY  COUNTS:  YES  TOURNIQUET:  * No tourniquets in log *  DICTATION: .Other Dictation: Dictation Number [id number]  PLAN OF CARE: Admit for overnight observation  PATIENT DISPOSITION:  PACU - hemodynamically stable.   Delay start of Pharmacological VTE agent (>24hrs) due to surgical blood loss or risk of bleeding: not applicable

## 2016-05-01 NOTE — Transfer of Care (Signed)
Immediate Anesthesia Transfer of Care Note  Patient: Trish Fountain  Procedure(s) Performed: Procedure(s): CYSTOSCOPY WITH LEFT RETROGRADE PYELOGRAM/URETERAL LEFT STENT PLACEMENT (Left) HOLMIUM LASER APPLICATION (N/A)  Patient Location: PACU  Anesthesia Type:General  Level of Consciousness: awake, alert , oriented and patient cooperative  Airway & Oxygen Therapy: Patient Spontanous Breathing and Patient connected to face mask oxygen  Post-op Assessment: Report given to RN, Post -op Vital signs reviewed and stable and Patient moving all extremities  Post vital signs: Reviewed and stable  Last Vitals:  Vitals:   05/01/16 1620 05/01/16 1932  BP: 112/63 (P) 135/62  Pulse: 70 (P) 82  Resp: 18 (P) 12  Temp: 37.1 C     Last Pain:  Vitals:   05/01/16 1710  TempSrc:   PainSc: 0-No pain         Complications: No apparent anesthesia complications

## 2016-05-01 NOTE — Op Note (Signed)
NAMEDUGLAS, DADO NO.:  [id number]  MEDICAL RECORD NO.:  [id number]  LOCATION:  74                         FACILITY:  [facility]  PHYSICIAN:  Marshall Cork. Jeffie Pollock, M.D.    DATE OF BIRTH:  02-24-1932  DATE OF PROCEDURE:  05/01/2016 DATE OF DISCHARGE:                              OPERATIVE REPORT   PROCEDURES: 1. Cystoscopy with first-stage cystolitholapaxy greater than 2.5-cm     stones. 2. Left retrograde pyelogram with interpretation. 3. Left ureteroscopic stone extraction.  PREOPERATIVE DIAGNOSIS:  Left hydro with recent fever and chill and bladder stones.  POSTOPERATIVE DIAGNOSIS:  Left hydro with recent fever and chill and bladder stones with large stones greater that 2.5 cm in the prostatic fossa and bladder.  SURGEON:  Marshall Cork. Jeffie Pollock, M.D.  ANESTHESIA:  General.  SPECIMEN:  Culture from the left renal pelvis and stone fragments for chemical analysis.  BLOOD LOSS:  Minimal.  DRAINS:  A 6-French x 26-cm left double-J stent and 18-French Coude Foley catheter.  COMPLICATIONS:  None.  INDICATIONS:  [name] [name] is an 82 year old white male with a long, complicated urologic history.  He has a history of prostate cancer, treated with a seed implantation.  He had a local recurrence that was initially treated with resection and androgen ablation and then previously had a left distal ureteral stone that was managed first with percutaneous nephrolithotomy followed by subsequent antegrade stent placement and then ureteroscopy.  He was recently seen in followup for rising PSA and the staging CT demonstrated a 6-mm left distal ureteral stone, bladder stones and left hydronephrosis.  He was scheduled originally for an elective procedure on May 14, 2016.  However, earlier today, he had a shaking chill and temperature of 100.4.  His previous culture just grew three different gram-positive organisms.  It was felt that more urgent stenting was indicated because of  the fever and chills.  FINDINGS AND PROCEDURE:  He was taken to the operating room where was given gentamicin and vancomycin per pharmacy dosing.  He was then given a general anesthetic, placed in the lithotomy position and his perineum and genitalia were prepped with Betadine solution.  He was draped in usual sterile fashion.  Cystoscopy was performed with a 23-French scope and the 30-degree lens. Examination revealed the normal urethra.  The external sphincter was incompetent.  In proximal, the external sphincter was originally what I felt to be the bladder, but subsequently, proved to be the prostatic fossa.  It was filled with multiple stones and I could not advance the cystoscope to the bladder.  I did attempt using a ureteroscope beside what I thought might be a ureteral orifice in this cavity, but was unsuccessful.  At this point, a 1000-micron holmium laser fiber was used to engage the stones in the prostatic fossa, set on 1 watt and 20 hertz.  The stones were broken and eventually were removed clearing the prostatic fossa.  I was then able to cross the somewhat contracted bladder neck.  He had several additional stones in the bladder with the largest of the stones being greater than 2.5 cm, but that was the one in the prostatic fossa. These other stones measured  approximately 1.5 to 2 cm and appeared visually.  Initially, I began fragmenting these stones to help clear the area, but then elected to inspect for the ureteral orifice since I felt these stones would be bladder treated at later date.  At this point, a guidewire was passed through the ureteroscope and I was able to successfully find the ureteral orifice, which was adjacent to the bladder neck and advanced the Sensor wire into the lower proximal ureter.  Upon passage of the wire, there was prompt extrusion of a copious amount of thick toothpaste-like purulent material, the wound drained from the bladder was  malodorous.  Initially, I was unable to advance the catheter beyond the lower proximal ureter.  However, using an open-ended catheter and replacing the Sensor wire with a glidewire, I was able to advance the open-ended catheter to the renal pelvis.  A urine specimen was obtained from the renal pelvis for culture and then, a Sensor wire was reinserted to the kidney.  At this point, a 6-French 26-cm Contour double-J stent without string was passed to the kidney under fluoroscopic guidance.  The wire was removed leaving good coil in the kidney and partial coil in the bladder. The stent was readjusted using grasping forceps to ensure good placement in the bladder.  At this point, I removed the scope to the prostatic fossa fragmented a couple of small stones in this area to clear the prostatic fossa, this Foley placement and evacuated those fragments into the drape.  The cystoscope was then removed and an 18-French Coude Foley catheter was inserted without difficulty to the bladder.  The balloon was filled with 10 mL of sterile fluid and the catheter was placed to straight drainage.  Stone fragments were sent to the lab for chemical analysis. His anesthetic was reversed.  He was taken down from lithotomy position. His anesthetic was reversed and he was moved to the recovery room in stable condition.  He will be admitted for observation to ensure he does not become septic from the manipulation.     Marshall Cork. Jeffie Pollock, M.D.     JJW/MEDQ  D:  05/01/2016  T:  05/01/2016  Job:  [id number]

## 2016-05-01 NOTE — Anesthesia Preprocedure Evaluation (Addendum)
Anesthesia Evaluation  Patient identified by MRN, date of birth, ID band Patient awake    Reviewed: Allergy & Precautions, NPO status , Patient's Chart, lab work & pertinent test results  Airway Mallampati: II  TM Distance: >3 FB Neck ROM: Full    Dental  (+) Teeth Intact, Dental Advisory Given   Pulmonary neg pulmonary ROS,    breath sounds clear to auscultation       Cardiovascular hypertension,  Rhythm:Regular Rate:Normal     Neuro/Psych negative neurological ROS  negative psych ROS   GI/Hepatic negative GI ROS, Neg liver ROS,   Endo/Other  Hypothyroidism   Renal/GU negative Renal ROS  negative genitourinary   Musculoskeletal  (+) Arthritis ,   Abdominal   Peds negative pediatric ROS (+)  Hematology negative hematology ROS (+)   Anesthesia Other Findings   Reproductive/Obstetrics negative OB ROS                            Lab Results  Component Value Date   WBC 7.0 04/28/2016   HGB 12.8 (L) 04/28/2016   HCT 38.8 04/28/2016   MCV 94.2 04/28/2016   PLT 176 04/28/2016   Lab Results  Component Value Date   CREATININE 1.0 04/28/2016   BUN 24.7 04/28/2016   NA 142 04/28/2016   K 4.4 04/28/2016   CL 103 07/21/2013   CO2 28 04/28/2016   Lab Results  Component Value Date   INR 1.03 07/16/2013   INR 1.96 (H) 07/02/2009   INR 2.18 (H) 07/01/2009   04/2016 EKG: normal sinus rhythm, RBBB.   Anesthesia Physical Anesthesia Plan  ASA: III  Anesthesia Plan: General   Post-op Pain Management:    Induction: Intravenous  Airway Management Planned: LMA  Additional Equipment:   Intra-op Plan:   Post-operative Plan: Extubation in OR  Informed Consent: I have reviewed the patients History and Physical, chart, labs and discussed the procedure including the risks, benefits and alternatives for the proposed anesthesia with the patient or authorized representative who has indicated  his/her understanding and acceptance.   Dental advisory given  Plan Discussed with: CRNA  Anesthesia Plan Comments:         Anesthesia Quick Evaluation

## 2016-05-01 NOTE — Anesthesia Postprocedure Evaluation (Signed)
Anesthesia Post Note  Patient: [name] [name]  Procedure(s) Performed: Procedure(s) (LRB): CYSTOSCOPY WITH LEFT RETROGRADE PYELOGRAM/URETERAL LEFT STENT PLACEMENT (Left) HOLMIUM LASER APPLICATION (N/A)  Patient location during evaluation: PACU Anesthesia Type: General Level of consciousness: awake and alert Pain management: pain level controlled Vital Signs Assessment: post-procedure vital signs reviewed and stable Respiratory status: spontaneous breathing, nonlabored ventilation, respiratory function stable and patient connected to nasal cannula oxygen Cardiovascular status: blood pressure returned to baseline and stable Postop Assessment: no signs of nausea or vomiting Anesthetic complications: no    Last Vitals:  Vitals:   05/01/16 2000 05/01/16 2015  BP: (!) 108/59   Pulse:    Resp:    Temp:  36.9 C    Last Pain:  Vitals:   05/01/16 1710  TempSrc:   PainSc: 0-No pain                 Effie Berkshire

## 2016-05-02 ENCOUNTER — Encounter (HOSPITAL_COMMUNITY): Payer: Self-pay | Admitting: Urology

## 2016-05-02 DIAGNOSIS — N210 Calculus in bladder: Secondary | ICD-10-CM | POA: Diagnosis not present

## 2016-05-02 DIAGNOSIS — N136 Pyonephrosis: Secondary | ICD-10-CM | POA: Diagnosis not present

## 2016-05-02 DIAGNOSIS — N289 Disorder of kidney and ureter, unspecified: Secondary | ICD-10-CM

## 2016-05-02 LAB — BASIC METABOLIC PANEL
Anion gap: 8 (ref 5–15)
BUN: 46 mg/dL — ABNORMAL HIGH (ref 6–20)
CALCIUM: 8.5 mg/dL — AB (ref 8.9–10.3)
CO2: 27 mmol/L (ref 22–32)
CREATININE: 1.8 mg/dL — AB (ref 0.61–1.24)
Chloride: 101 mmol/L (ref 101–111)
GFR calc non Af Amer: 33 mL/min — ABNORMAL LOW (ref >60)
GFR, EST AFRICAN AMERICAN: 39 mL/min — AB (ref >60)
GLUCOSE: 113 mg/dL — AB (ref 65–99)
Potassium: 4 mmol/L (ref 3.5–5.1)
Sodium: 136 mmol/L (ref 135–145)

## 2016-05-02 LAB — CBC
HCT: 30.6 % — ABNORMAL LOW (ref 39.0–52.0)
Hemoglobin: 10.1 g/dL — ABNORMAL LOW (ref 13.0–17.0)
MCH: 31.4 pg (ref 26.0–34.0)
MCHC: 33 g/dL (ref 30.0–36.0)
MCV: 95 fL (ref 78.0–100.0)
PLATELETS: 108 10*3/uL — AB (ref 150–400)
RBC: 3.22 MIL/uL — AB (ref 4.22–5.81)
RDW: 13.8 % (ref 11.5–15.5)
WBC: 7.2 10*3/uL (ref 4.0–10.5)

## 2016-05-02 MED ORDER — HYDROCODONE-ACETAMINOPHEN 5-325 MG PO TABS: 1.0000 | ORAL_TABLET | ORAL | 0 refills | Status: DC | PRN

## 2016-05-02 MED ORDER — AMOXICILLIN-POT CLAVULANATE 500-125 MG PO TABS: 1.0000 | ORAL_TABLET | Freq: Three times a day (TID) | ORAL | 0 refills | Status: DC

## 2016-05-02 NOTE — Discharge Summary (Signed)
Physician Discharge Summary  Patient ID: [name] [name] MRN: [id number] DOB/AGE: 05/07/1932 82 y.o.  Admit date: 05/01/2016 Discharge date: 05/02/2016  Admission Diagnoses:  Ureteral stone  Discharge Diagnoses:  Principal Problem:   Ureteral stone Active Problems:   Pyonephrosis   Acute renal insufficiency   Bladder stone   Past Medical History:  Diagnosis Date  . Allergic rhinitis   . Arthritis   . Cancer ([place])    prostrate  . Chronic left shoulder pain    AND DECREASE ROM--  END-STAGE OA  . History of cardiac murmur    ASYMPTOMATIC  . History of kidney stones   . History of prostate cancer    S/P RADIOACTIVE SEED IMPLANTS 1997  . History of urinary tract obstruction    BNC  . Hypothyroidism   . Incomplete RBBB   . Left ureteral calculus   . OA (osteoarthritis)   . Organic impotence   . Urinary incontinence   . Wears glasses     Surgeries: Procedure(s): CYSTOSCOPY WITH LEFT RETROGRADE PYELOGRAM/URETERAL LEFT STENT PLACEMENT HOLMIUM LASER APPLICATION on 0000000 CYSTOLITHALOPAXY.    Consultants (if any):   Discharged Condition: Improved  Hospital Course: HARVEER EDGERSON is an 82 y.o. male who was admitted 05/01/2016 with a diagnosis of Ureteral stone and infection and went to the operating room on 05/01/2016 and underwent the above named procedures.  He was found to have multiple large bladder and prostatic fossa stones that required laser to access the bladder.   He had marked left pyonephrosis but was successfully stented.  He was given Vanc and Norva Karvonen for the procedure and zosyn post op.  His lactate is normal at 1, but he did bump his Cr to 1.92 from 1 on 8/4.  It was down to 1.8 this morning and he remained afebrile without pain.  I will remove his foley today and sent him home on Augmentin pending a culture.   He is scheduled for removal of the right ureteral stone and remaining bladder stones on 8/24 and I will have him come for a culture prior to the procedure.    He was given perioperative antibiotics:  Anti-infectives    Start     Dose/Rate Route Frequency Ordered Stop   05/02/16 0000  amoxicillin-clavulanate (AUGMENTIN) 500-125 MG tablet     1 tablet Oral 3 times daily 05/02/16 0947     05/01/16 2200  piperacillin-tazobactam (ZOSYN) IVPB 3.375 g     3.375 g 12.5 mL/hr over 240 Minutes Intravenous Every 8 hours 05/01/16 2052     05/01/16 1621  gentamicin (GARAMYCIN) 410 mg in dextrose 5 % 100 mL IVPB     5 mg/kg  82.7 kg (Adjusted) 110.3 mL/hr over 60 Minutes Intravenous 60 min pre-op 05/01/16 1614 05/01/16 1842   05/01/16 1614  vancomycin (VANCOCIN) IVPB 1000 mg/200 mL premix    Comments:  Dosage per pharmacy per Dr. Jeffie Pollock.   1,000 mg 200 mL/hr over 60 Minutes Intravenous 60 min pre-op 05/01/16 1614 05/01/16 1835    .  He was given sequential compression devices for DVT prophylaxis.  He benefited maximally from the hospital stay and there were no complications.    Recent vital signs:  Vitals:   05/02/16 0225 05/02/16 0620  BP: (!) 110/55 123/60  Pulse: 68 71  Resp: 18 19  Temp: 98.4 F (36.9 C) 98.2 F (36.8 C)    Recent laboratory studies:  Lab Results  Component Value Date   HGB 10.1 (L) 05/02/2016  HGB 10.7 (L) 05/01/2016   HGB 12.8 (L) 04/28/2016   Lab Results  Component Value Date   WBC 7.2 05/02/2016   PLT 108 (L) 05/02/2016   Lab Results  Component Value Date   INR 1.03 07/16/2013   Lab Results  Component Value Date   NA 136 05/02/2016   K 4.0 05/02/2016   CL 101 05/02/2016   CO2 27 05/02/2016   BUN 46 (H) 05/02/2016   CREATININE 1.80 (H) 05/02/2016   GLUCOSE 113 (H) 05/02/2016    Discharge Medications:     Medication List    TAKE these medications   acetaminophen 325 MG tablet Commonly known as:  TYLENOL Take 650 mg by mouth every 6 (six) hours as needed for mild pain.   amoxicillin-clavulanate 500-125 MG tablet Commonly known as:  AUGMENTIN Take 1 tablet (500 mg total) by mouth 3 (three)  times daily.   HYDROcodone-acetaminophen 5-325 MG tablet Commonly known as:  NORCO/VICODIN Take 1 tablet by mouth every 4 (four) hours as needed for moderate pain.   levothyroxine 150 MCG tablet Commonly known as:  SYNTHROID, LEVOTHROID Take 150 mcg by mouth daily before breakfast.   meloxicam 15 MG tablet Commonly known as:  MOBIC Take 15 mg by mouth daily.   multivitamin with minerals Tabs tablet Take 1 tablet by mouth every 7 (seven) days.   MYRBETRIQ 50 MG Tb24 tablet Generic drug:  mirabegron ER Take 50 mg by mouth daily.   VITAMIN B 12 PO Take 1 tablet by mouth 3 (three) times a week.       Diagnostic Studies: Nm Bone Scan Whole Body  Result Date: 04/19/2016 CLINICAL DATA:  History of prostate cancer. No trauma and no current pain. PSA 0.65 on 12/27/2015. EXAM: NUCLEAR MEDICINE WHOLE BODY BONE SCAN TECHNIQUE: Whole body anterior and posterior images were obtained approximately 3 hours after intravenous injection of radiopharmaceutical. RADIOPHARMACEUTICALS:  25.5 mCi Technetium-74m MDP IV COMPARISON:  04/08/2015 and 05/25/2010 as well as CT 04/19/2016 and 04/08/2015 FINDINGS: Examination demonstrates normal distribution of radiotracer within the bones and soft tissues as well as urinary system. Persistent horizontal uptake over the approximate T8-9 level compatible with known compression fracture unchanged. Patchy uptake over the lower lumbar spine compatible with degenerative changes and stable. Mild symmetric uptake over the shoulders, knees and ankles/feet which is stable and compatible degenerative changes. No new areas of osseous uptake suspicious for metastatic disease. Evidence of mild delayed excretion of radiotracer by the left kidney with minimal prominence of the left intrarenal collecting system and ureter. Findings are compatible with 6 mm stone seen over the distal left ureter on recent CT. IMPRESSION: No definite evidence of osseous metastatic disease. Known stable  compression fracture over the T8-9 level. Stable patchy uptake over the lower lumbar spine compatible degenerative changes. Symmetric degenerative changes over the appendicular skeleton. Delayed excretion with dilatation of the left intrarenal collecting system and ureter compatible with known 6 mm distal left ureteral stone seen on recent CT today. Electronically Signed   By: Marin Olp M.D.   On: 04/19/2016 14:32   Disposition: 01-Home or Self Care  Discharge Instructions    Discontinue IV    Complete by:  As directed      Follow-up Information    Malka So, MD .   Specialty:  Urology Why:  I will need to have you come in to the office about 5-7 days prior to your next surgery on 8/24 to check a urine culture.   Please  call the office to arrange a time.  Contact information: [place] [address] [phone]            Signed: Malka So 05/02/2016, 9:55 AM

## 2016-05-02 NOTE — Discharge Instructions (Signed)

## 2016-05-03 LAB — URINE CULTURE

## 2016-05-06 LAB — CULTURE, BLOOD (ROUTINE X 2)
CULTURE: NO GROWTH
CULTURE: NO GROWTH

## 2016-05-09 DIAGNOSIS — N201 Calculus of ureter: Secondary | ICD-10-CM | POA: Diagnosis not present

## 2016-05-10 ENCOUNTER — Encounter (HOSPITAL_BASED_OUTPATIENT_CLINIC_OR_DEPARTMENT_OTHER): Payer: Self-pay | Admitting: *Deleted

## 2016-05-11 ENCOUNTER — Encounter (HOSPITAL_BASED_OUTPATIENT_CLINIC_OR_DEPARTMENT_OTHER)
Admission: RE | Admit: 2016-05-11 | Discharge: 2016-05-11 | Disposition: A | Discharge status: Home / Self Care | Payer: Medicare HMO | Source: Ambulatory Visit | Attending: Urology | Admitting: Urology

## 2016-05-11 ENCOUNTER — Encounter (HOSPITAL_BASED_OUTPATIENT_CLINIC_OR_DEPARTMENT_OTHER): Payer: Self-pay | Admitting: *Deleted

## 2016-05-11 ENCOUNTER — Other Ambulatory Visit: Payer: Self-pay

## 2016-05-11 DIAGNOSIS — Z01810 Encounter for preprocedural cardiovascular examination: Secondary | ICD-10-CM | POA: Diagnosis not present

## 2016-05-11 NOTE — Progress Notes (Signed)
ekg  Performed 05/01/16 reviewed w Dr. Al Corpus.  Repeat ekg ordered.  If ekg reading confirmed as no change , pt will need cardiology consult.

## 2016-05-11 NOTE — Progress Notes (Addendum)
Pt instructed npo p mn 8/23 x synthroid w sip of water.  Labs in epic. To [facility] 8/24 @ 0600.  Pt informed of most recent ekg w noted changes and Dr. Al Corpus has ordered a repeat ekg.  Pt aware that he may need cardiac clearance if the ekg is unchanged.  Pt verbalized his understanding and will come to [facility] today @ 2pm for ekg.    ADDENDUM:  PT CALLED TO ASK IF COULD TAKE 1 OZ PEPTO BISMOL FOR DIARRHEA AM DOS.  TOLD YES THAT HE COULD TAKE THIS SINCE HE STATED THAT PEPTO TAKES CARE OF THE DIARRHEA.

## 2016-05-12 LAB — STONE ANALYSIS
Ca Hydrogen Phos.: 20 %
Ca Oxalate,Monohydr.: 30 %
Ca phos cry stone ql IR: 40 %
MAGNESIUM AMMON PHOS: 10 %
STONE WEIGHT KSTONE: 504 mg

## 2016-05-15 DIAGNOSIS — Z0100 Encounter for examination of eyes and vision without abnormal findings: Secondary | ICD-10-CM | POA: Diagnosis not present

## 2016-05-15 DIAGNOSIS — H524 Presbyopia: Secondary | ICD-10-CM | POA: Diagnosis not present

## 2016-05-16 ENCOUNTER — Encounter (HOSPITAL_BASED_OUTPATIENT_CLINIC_OR_DEPARTMENT_OTHER): Payer: Self-pay | Admitting: *Deleted

## 2016-05-18 ENCOUNTER — Ambulatory Visit (HOSPITAL_BASED_OUTPATIENT_CLINIC_OR_DEPARTMENT_OTHER)
Admission: RE | Admit: 2016-05-18 | Discharge: 2016-05-18 | Disposition: A | Discharge status: Home / Self Care | Payer: Medicare HMO | Source: Ambulatory Visit | Attending: Urology | Admitting: Urology

## 2016-05-18 ENCOUNTER — Encounter (HOSPITAL_BASED_OUTPATIENT_CLINIC_OR_DEPARTMENT_OTHER)
Admission: RE | Disposition: A | Discharge status: Home / Self Care | Payer: Self-pay | Source: Ambulatory Visit | Attending: Urology

## 2016-05-18 ENCOUNTER — Ambulatory Visit (HOSPITAL_BASED_OUTPATIENT_CLINIC_OR_DEPARTMENT_OTHER): Payer: Medicare HMO | Admitting: Anesthesiology

## 2016-05-18 ENCOUNTER — Encounter (HOSPITAL_BASED_OUTPATIENT_CLINIC_OR_DEPARTMENT_OTHER): Payer: Self-pay | Admitting: *Deleted

## 2016-05-18 DIAGNOSIS — Z85828 Personal history of other malignant neoplasm of skin: Secondary | ICD-10-CM | POA: Diagnosis not present

## 2016-05-18 DIAGNOSIS — E669 Obesity, unspecified: Secondary | ICD-10-CM | POA: Diagnosis not present

## 2016-05-18 DIAGNOSIS — N201 Calculus of ureter: Secondary | ICD-10-CM | POA: Diagnosis not present

## 2016-05-18 DIAGNOSIS — E039 Hypothyroidism, unspecified: Secondary | ICD-10-CM | POA: Diagnosis not present

## 2016-05-18 DIAGNOSIS — Z8546 Personal history of malignant neoplasm of prostate: Secondary | ICD-10-CM | POA: Diagnosis not present

## 2016-05-18 DIAGNOSIS — Z6834 Body mass index (BMI) 34.0-34.9, adult: Secondary | ICD-10-CM | POA: Diagnosis not present

## 2016-05-18 DIAGNOSIS — N210 Calculus in bladder: Secondary | ICD-10-CM | POA: Insufficient documentation

## 2016-05-18 DIAGNOSIS — I10 Essential (primary) hypertension: Secondary | ICD-10-CM | POA: Diagnosis not present

## 2016-05-18 DIAGNOSIS — N132 Hydronephrosis with renal and ureteral calculous obstruction: Secondary | ICD-10-CM | POA: Diagnosis not present

## 2016-05-18 DIAGNOSIS — I129 Hypertensive chronic kidney disease with stage 1 through stage 4 chronic kidney disease, or unspecified chronic kidney disease: Secondary | ICD-10-CM | POA: Diagnosis not present

## 2016-05-18 DIAGNOSIS — N189 Chronic kidney disease, unspecified: Secondary | ICD-10-CM | POA: Diagnosis not present

## 2016-05-18 HISTORY — PX: CYSTOSCOPY WITH LITHOLAPAXY: SHX1425

## 2016-05-18 HISTORY — PX: CYSTOSCOPY WITH URETEROSCOPY AND STENT PLACEMENT: SHX6377

## 2016-05-18 HISTORY — PX: HOLMIUM LASER APPLICATION: SHX5852

## 2016-05-18 HISTORY — PX: STONE EXTRACTION WITH BASKET: SHX5318

## 2016-05-18 HISTORY — PX: CYSTOSCOPY W/ URETERAL STENT PLACEMENT: SHX1429

## 2016-05-18 LAB — POCT I-STAT, CHEM 8
BUN: 31 mg/dL — ABNORMAL HIGH (ref 6–20)
Calcium, Ion: 1.28 mmol/L — ABNORMAL HIGH (ref 1.12–1.23)
Chloride: 106 mmol/L (ref 101–111)
Creatinine, Ser: 0.9 mg/dL (ref 0.61–1.24)
GLUCOSE: 101 mg/dL — AB (ref 65–99)
HCT: 37 % — ABNORMAL LOW (ref 39.0–52.0)
HEMOGLOBIN: 12.6 g/dL — AB (ref 13.0–17.0)
POTASSIUM: 4.3 mmol/L (ref 3.5–5.1)
SODIUM: 143 mmol/L (ref 135–145)
TCO2: 25 mmol/L (ref 0–100)

## 2016-05-18 SURGERY — CYSTOSCOPY, WITH BLADDER CALCULUS LITHOLAPAXY
Anesthesia: General | Site: Ureter

## 2016-05-18 MED ORDER — KETOROLAC TROMETHAMINE 30 MG/ML IJ SOLN
INTRAMUSCULAR | Status: AC
  Filled 2016-05-18: qty 1

## 2016-05-18 MED ORDER — AMOXICILLIN-POT CLAVULANATE 500-125 MG PO TABS: 1.0000 | ORAL_TABLET | Freq: Three times a day (TID) | ORAL | 0 refills | Status: DC

## 2016-05-18 MED ORDER — AMPICILLIN-SULBACTAM SODIUM 3 (2-1) G IJ SOLR
INTRAMUSCULAR | Status: AC
  Filled 2016-05-18: qty 3

## 2016-05-18 MED ORDER — EPHEDRINE SULFATE 50 MG/ML IJ SOLN
INTRAMUSCULAR | Status: AC
  Filled 2016-05-18: qty 1

## 2016-05-18 MED ORDER — DEXAMETHASONE SODIUM PHOSPHATE 10 MG/ML IJ SOLN
INTRAMUSCULAR | Status: AC
  Filled 2016-05-18: qty 1

## 2016-05-18 MED ORDER — PROMETHAZINE HCL 25 MG/ML IJ SOLN
6.2500 mg | INTRAMUSCULAR | Status: DC | PRN
  Filled 2016-05-18: qty 1

## 2016-05-18 MED ORDER — GENTAMICIN SULFATE 40 MG/ML IJ SOLN
5.0000 mg/kg | INTRAVENOUS | Status: DC
  Filled 2016-05-18: qty 13.25

## 2016-05-18 MED ORDER — ONDANSETRON HCL 4 MG/2ML IJ SOLN
INTRAMUSCULAR | Status: DC | PRN
  Administered 2016-05-18: 4 mg via INTRAVENOUS

## 2016-05-18 MED ORDER — STERILE WATER FOR IRRIGATION IR SOLN
Status: DC | PRN
  Administered 2016-05-18: 500 mL

## 2016-05-18 MED ORDER — DEXAMETHASONE SODIUM PHOSPHATE 4 MG/ML IJ SOLN
INTRAMUSCULAR | Status: DC | PRN
  Administered 2016-05-18: 10 mg via INTRAVENOUS

## 2016-05-18 MED ORDER — FENTANYL CITRATE (PF) 100 MCG/2ML IJ SOLN
INTRAMUSCULAR | Status: AC
  Filled 2016-05-18: qty 2

## 2016-05-18 MED ORDER — LIDOCAINE HCL 2 % EX GEL
CUTANEOUS | Status: AC
  Filled 2016-05-18: qty 5

## 2016-05-18 MED ORDER — BELLADONNA ALKALOIDS-OPIUM 16.2-60 MG RE SUPP
RECTAL | Status: AC
  Filled 2016-05-18: qty 1

## 2016-05-18 MED ORDER — KETOROLAC TROMETHAMINE 30 MG/ML IJ SOLN
INTRAMUSCULAR | Status: DC | PRN
  Administered 2016-05-18: 30 mg via INTRAVENOUS

## 2016-05-18 MED ORDER — PROPOFOL 10 MG/ML IV BOLUS
INTRAVENOUS | Status: AC
  Filled 2016-05-18: qty 20

## 2016-05-18 MED ORDER — LIDOCAINE 2% (20 MG/ML) 5 ML SYRINGE
INTRAMUSCULAR | Status: DC | PRN
  Administered 2016-05-18: 60 mg via INTRAVENOUS

## 2016-05-18 MED ORDER — ONDANSETRON HCL 4 MG/2ML IJ SOLN
INTRAMUSCULAR | Status: AC
  Filled 2016-05-18: qty 2

## 2016-05-18 MED ORDER — LIDOCAINE HCL 2 % EX GEL: CUTANEOUS | Status: DC | PRN

## 2016-05-18 MED ORDER — SODIUM CHLORIDE 0.9 % IV SOLN
INTRAVENOUS | Status: AC
  Filled 2016-05-18: qty 100

## 2016-05-18 MED ORDER — EPHEDRINE SULFATE 50 MG/ML IJ SOLN
INTRAMUSCULAR | Status: DC | PRN
  Administered 2016-05-18: 10 mg via INTRAVENOUS

## 2016-05-18 MED ORDER — PROPOFOL 10 MG/ML IV BOLUS
INTRAVENOUS | Status: DC | PRN
  Administered 2016-05-18: 160 mg via INTRAVENOUS

## 2016-05-18 MED ORDER — FENTANYL CITRATE (PF) 100 MCG/2ML IJ SOLN
INTRAMUSCULAR | Status: DC | PRN
  Administered 2016-05-18: 25 ug via INTRAVENOUS
  Administered 2016-05-18: 50 ug via INTRAVENOUS
  Administered 2016-05-18: 25 ug via INTRAVENOUS

## 2016-05-18 MED ORDER — LIDOCAINE HCL (CARDIAC) 20 MG/ML IV SOLN
INTRAVENOUS | Status: AC
  Filled 2016-05-18: qty 5

## 2016-05-18 MED ORDER — HYDROMORPHONE HCL 1 MG/ML IJ SOLN
0.2500 mg | INTRAMUSCULAR | Status: DC | PRN
  Filled 2016-05-18: qty 1

## 2016-05-18 MED ORDER — SODIUM CHLORIDE 0.9 % IR SOLN
Status: DC | PRN
  Administered 2016-05-18: 1000 mL via INTRAVESICAL
  Administered 2016-05-18 (×3): 3000 mL via INTRAVESICAL

## 2016-05-18 MED ORDER — GENTAMICIN SULFATE 40 MG/ML IJ SOLN
5.0000 mg/kg | INTRAVENOUS | Status: AC
  Administered 2016-05-18: 410 mg via INTRAVENOUS
  Filled 2016-05-18: qty 10.25

## 2016-05-18 MED ORDER — LACTATED RINGERS IV SOLN
INTRAVENOUS | Status: DC
  Administered 2016-05-18: 06:00:00 via INTRAVENOUS
  Filled 2016-05-18: qty 1000

## 2016-05-18 MED ORDER — BELLADONNA ALKALOIDS-OPIUM 16.2-60 MG RE SUPP
RECTAL | Status: DC | PRN
  Administered 2016-05-18: 1 via RECTAL

## 2016-05-18 MED ORDER — SODIUM CHLORIDE 0.9 % IV SOLN
3.0000 g | INTRAVENOUS | Status: AC
  Administered 2016-05-18: 3 g via INTRAVENOUS
  Filled 2016-05-18: qty 3

## 2016-05-18 SURGICAL SUPPLY — 50 items
BAG DRAIN URO-CYSTO SKYTR STRL (DRAIN) ×4 IMPLANT
BAG DRN UROCATH (DRAIN) ×3
BAG URINE LEG 500ML (DRAIN) ×1 IMPLANT
BASKET LASER NITINOL 1.9FR (BASKET) IMPLANT
BASKET STNLS GEMINI 4WIRE 3FR (BASKET) IMPLANT
BASKET STONE 1.7 NGAGE (UROLOGICAL SUPPLIES) ×1 IMPLANT
BASKET STONE NCOMPASS (UROLOGICAL SUPPLIES) ×1 IMPLANT
BASKET ZERO TIP NITINOL 2.4FR (BASKET) IMPLANT
BSKT STON RTRVL 120 1.9FR (BASKET)
BSKT STON RTRVL GEM 120X11 3FR (BASKET)
BSKT STON RTRVL ZERO TP 2.4FR (BASKET)
CATH FOLEY 2WAY SLVR  5CC 18FR (CATHETERS) ×1
CATH FOLEY 2WAY SLVR 5CC 18FR (CATHETERS) IMPLANT
CATH INTERMIT  6FR 70CM (CATHETERS) IMPLANT
CATH URET 5FR 28IN CONE TIP (BALLOONS)
CATH URET 5FR 28IN OPEN ENDED (CATHETERS) ×1 IMPLANT
CATH URET 5FR 70CM CONE TIP (BALLOONS) IMPLANT
CLOTH BEACON ORANGE TIMEOUT ST (SAFETY) ×7 IMPLANT
ELECT REM PT RETURN 9FT ADLT (ELECTROSURGICAL)
ELECTRODE REM PT RTRN 9FT ADLT (ELECTROSURGICAL) IMPLANT
ELECTROHYDROLIC PROBE 9FR (MISCELLANEOUS) IMPLANT
FIBER LASER FLEXIVA 1000 (UROLOGICAL SUPPLIES) ×1 IMPLANT
FIBER LASER FLEXIVA 365 (UROLOGICAL SUPPLIES) IMPLANT
FIBER LASER FLEXIVA 550 (UROLOGICAL SUPPLIES) IMPLANT
FIBER LASER TRAC TIP (UROLOGICAL SUPPLIES) IMPLANT
GLOVE SURG SS PI 8.0 STRL IVOR (GLOVE) ×4 IMPLANT
GOWN STRL REUS W/ TWL LRG LVL3 (GOWN DISPOSABLE) ×3 IMPLANT
GOWN STRL REUS W/ TWL XL LVL3 (GOWN DISPOSABLE) ×3 IMPLANT
GOWN STRL REUS W/TWL LRG LVL3 (GOWN DISPOSABLE) ×4
GOWN STRL REUS W/TWL XL LVL3 (GOWN DISPOSABLE) ×4
GUIDEWIRE 0.038 PTFE COATED (WIRE) IMPLANT
GUIDEWIRE ANG ZIPWIRE 038X150 (WIRE) IMPLANT
GUIDEWIRE STR DUAL SENSOR (WIRE) ×4 IMPLANT
IV NS 1000ML (IV SOLUTION) ×4
IV NS 1000ML BAXH (IV SOLUTION) IMPLANT
IV NS IRRIG 3000ML ARTHROMATIC (IV SOLUTION) ×9 IMPLANT
KIT BALLIN UROMAX 15FX10 (LABEL) IMPLANT
KIT BALLN UROMAX 15FX4 (MISCELLANEOUS) IMPLANT
KIT BALLN UROMAX 26 75X4 (MISCELLANEOUS)
KIT ROOM TURNOVER WOR (KITS) ×4 IMPLANT
MANIFOLD NEPTUNE II (INSTRUMENTS) ×1 IMPLANT
PACK CYSTO (CUSTOM PROCEDURE TRAY) ×4 IMPLANT
PROBE LITHO 3.3FR 2137.235 (UROLOGICAL SUPPLIES) IMPLANT
PROBE LITHO 5.0FR 2137.1505 (MISCELLANEOUS) IMPLANT
SET HIGH PRES BAL DIL (LABEL)
SHEATH ACCESS URETERAL 38CM (SHEATH) ×1 IMPLANT
STENT URET 6FRX24 CONTOUR (STENTS) ×1 IMPLANT
SYRINGE IRR TOOMEY STRL 70CC (SYRINGE) ×1 IMPLANT
TUBE CONNECTING 12X1/4 (SUCTIONS) ×1 IMPLANT
WATER STERILE IRR 500ML POUR (IV SOLUTION) ×1 IMPLANT

## 2016-05-18 NOTE — Anesthesia Postprocedure Evaluation (Signed)
Anesthesia Post Note  Patient: [name] [name]  Procedure(s) Performed: Procedure(s) (LRB): CYSTOSCOPY WITH LITHOLAPAXY (N/A) CYSTOSCOPY WITH URETEROSCOPY (Left) HOLMIUM LASER APPLICATION (N/A) CYSTOSCOPY WITH STENT REPLACEMENT (Left) STONE EXTRACTION WITH BASKET (Left)  Patient location during evaluation: PACU Anesthesia Type: General Level of consciousness: awake and alert Pain management: pain level controlled Vital Signs Assessment: post-procedure vital signs reviewed and stable Respiratory status: spontaneous breathing, nonlabored ventilation, respiratory function stable and patient connected to nasal cannula oxygen Cardiovascular status: blood pressure returned to baseline and stable Postop Assessment: no signs of nausea or vomiting Anesthetic complications: no    Last Vitals:  Vitals:   05/18/16 0900 05/18/16 0915  BP: (!) 153/83 (!) 145/73  Pulse: 71 71  Resp: 11 14  Temp:      Last Pain:  Vitals:   05/18/16 0930  TempSrc:   PainSc: 0-No pain                 [name],[name] TERRILL

## 2016-05-18 NOTE — Interval H&P Note (Signed)
History and Physical Interval Note: He underwent and initial cystolithalopaxy and left ureteral stenting and is doing well.  He is back today to remove the remaining bladder stones and left ureteral stone.   05/18/2016 7:24 AM  [name] [name]  has presented today for surgery, with the diagnosis of LEFT DISTAL STONE POSSIBLE BLADDER STONE  The various methods of treatment have been discussed with the patient and family. After consideration of risks, benefits and other options for treatment, the patient has consented to  Procedure(s): CYSTOSCOPY WITH LITHOLAPAXY (N/A) CYSTOSCOPY WITH URETEROSCOPY AND POSSIBLE STENT PLACEMENT (N/A) HOLMIUM LASER APPLICATION (N/A) as a surgical intervention .  The patient's history has been reviewed, patient examined, no change in status, stable for surgery.  I have reviewed the patient's chart and labs.  Questions were answered to the patient's satisfaction.     Nasim Garofano J

## 2016-05-18 NOTE — Discharge Instructions (Signed)
Post Anesthesia Home Care Instructions  Activity: Get plenty of rest for the remainder of the day. A responsible adult should stay with you for 24 hours following the procedure.  For the next 24 hours, DO NOT: -Drive a car -Paediatric nurse -Drink alcoholic beverages -Take any medication unless instructed by your physician -Make any legal decisions or sign important papers.  Meals: Start with liquid foods such as gelatin or soup. Progress to regular foods as tolerated. Avoid greasy, spicy, heavy foods. If nausea and/or vomiting occur, drink only clear liquids until the nausea and/or vomiting subsides. Call your physician if vomiting continues.  Special Instructions/Symptoms: Your throat may feel dry or sore from the anesthesia or the breathing tube placed in your throat during surgery. If this causes discomfort, gargle with warm salt water. The discomfort should disappear within 24 hours.  If you had a scopolamine patch placed behind your ear for the management of post- operative nausea and/or vomiting:  1. The medication in the patch is effective for 72 hours, after which it should be removed.  Wrap patch in a tissue and discard in the trash. Wash hands thoroughly with soap and water. 2. You may remove the patch earlier than 72 hours if you experience unpleasant side effects which may include dry mouth, dizziness or visual disturbances. 3. Avoid touching the patch. Wash your hands with soap and water after contact with the patch.    [facility] [phone] Post Ureteroscopy With or Without Stent Instructions  Definitions:  Ureter: The duct that transports urine from the kidney to the bladder. Stent:   A plastic hollow tube that is placed into the ureter, from the kidney to the                 bladder to prevent the ureter from swelling shut.  GENERAL INSTRUCTIONS:  Despite the fact that no skin incisions were used, the area around the ureter and bladder is  raw and irritated. The stent is a foreign body which will further irritate the bladder wall. This irritation is manifested by increased frequency of urination, both day and night, and by an increase in the urge to urinate. In some, the urge to urinate is present almost always. Sometimes the urge is strong enough that you may not be able to stop yourself from urinating. The only real cure is to remove the stent and then give time for the bladder wall to heal which can't be done until the danger of the ureter swelling shut has passed, which varies.  You may see some blood in your urine while the stent is in place and a few days afterwards. Do not be alarmed, even if the urine was clear for a while. Get off your feet and drink lots of fluids until clearing occurs. If you start to pass clots or don't improve, call us.  DIET: You may return to your normal diet immediately. Because of the raw surface of your bladder, alcohol, spicy foods, acid type foods and drinks with caffeine may cause irritation or frequency and should be used in moderation. To keep your urine flowing freely and to avoid constipation, drink plenty of fluids during the day ( 8-10 glasses ). Tip: Avoid cranberry juice because it is very acidic.  ACTIVITY: Your physical activity doesn't need to be restricted. However, if you are very active, you may see some blood in your urine. We suggest that you reduce your activity under these circumstances until the bleeding has stopped.  BOWELS: It is important to keep your bowels regular during the postoperative period. Straining with bowel movements can cause bleeding. A bowel movement every other day is reasonable. Use a mild laxative if needed, such as Milk of Magnesia 2-3 tablespoons, or 2 Dulcolax tablets. Call if you continue to have problems. If you have been taking narcotics for pain, before, during or after your surgery, you may be constipated. Take a laxative if  necessary.   MEDICATION: You should resume your pre-surgery medications unless told not to. In addition you will often be given an antibiotic to prevent infection. These should be taken as prescribed until the bottles are finished unless you are having an unusual reaction to one of the drugs.  PROBLEMS YOU SHOULD REPORT TO [place]:  Fevers over 100.5 Fahrenheit.  Heavy bleeding, or clots ( See above notes about blood in urine ).  Inability to urinate.  Drug reactions ( hives, rash, nausea, vomiting, diarrhea ).  Severe burning or pain with urination that is not improving.  FOLLOW-UP: You will need a follow-up appointment to monitor your progress. Call for this appointment at the number listed above. Usually the first appointment will be about three to fourteen days after your surgery.        CYSTOSCOPY HOME CARE INSTRUCTIONS  Activity: Rest for the remainder of the day.  Do not drive or operate equipment today.  You may resume normal activities in one to two days as instructed by your physician.   Meals: Drink plenty of liquids and eat light foods such as gelatin or soup this evening.  You may return to a normal meal plan tomorrow.  Return to Work: You may return to work in one to two days or as instructed by your physician.  Special Instructions / Symptoms: Call your physician if any of these symptoms occur:   -persistent or heavy bleeding  -bleeding which continues after first few urination  -large blood clots that are difficult to pass  -urine stream diminishes or stops completely  -fever equal to or higher than 101 degrees Farenheit.  -cloudy urine with a strong, foul odor  -severe pain  Females should always wipe from front to back after elimination.  You may feel some burning pain when you urinate.  This should disappear with time.  Applying moist heat to the lower abdomen or a hot tub bath may help relieve the pain. \  You may remove the catheter in the morning by  cutting the side arm.  The catheter should just slide out.    You may remove the stent Sunday morning by pulling the attached string.   If you don't feel comfortable doing that you can come to the office Monday to have it done.  Patient Signature:  ________________________________________________________  Nurse's Signature:  ________________________________________________________

## 2016-05-18 NOTE — Anesthesia Preprocedure Evaluation (Addendum)
Anesthesia Evaluation  Patient identified by MRN, date of birth, ID band Patient awake    Reviewed: Allergy & Precautions, NPO status , Patient's Chart, lab work & pertinent test results  Airway Mallampati: II  TM Distance: >3 FB Neck ROM: Full    Dental  (+) Teeth Intact, Dental Advisory Given, Partial Upper   Pulmonary neg pulmonary ROS,    breath sounds clear to auscultation       Cardiovascular hypertension, + dysrhythmias  Rhythm:Regular Rate:Normal  EKG abnormal , RBBB sinus   Neuro/Psych negative neurological ROS  negative psych ROS   GI/Hepatic negative GI ROS, Neg liver ROS,   Endo/Other  Hypothyroidism   Renal/GU Renal diseasenegative Renal ROS  negative genitourinary   Musculoskeletal  (+) Arthritis ,   Abdominal (+) + obese,   Peds negative pediatric ROS (+)  Hematology negative hematology ROS (+)   Anesthesia Other Findings   Reproductive/Obstetrics negative OB ROS                            Anesthesia Physical Anesthesia Plan  ASA: III  Anesthesia Plan: General   Post-op Pain Management:    Induction: Intravenous  Airway Management Planned: LMA  Additional Equipment:   Intra-op Plan:   Post-operative Plan: Extubation in OR  Informed Consent: I have reviewed the patients History and Physical, chart, labs and discussed the procedure including the risks, benefits and alternatives for the proposed anesthesia with the patient or authorized representative who has indicated his/her understanding and acceptance.     Plan Discussed with: CRNA  Anesthesia Plan Comments:         Anesthesia Quick Evaluation

## 2016-05-18 NOTE — Transfer of Care (Signed)
Immediate Anesthesia Transfer of Care Note  Patient: [name] [name]  Procedure(s) Performed: Procedure(s) (LRB): CYSTOSCOPY WITH LITHOLAPAXY (N/A) CYSTOSCOPY WITH URETEROSCOPY (Left) HOLMIUM LASER APPLICATION (N/A) CYSTOSCOPY WITH STENT REPLACEMENT (Left) STONE EXTRACTION WITH BASKET (Left)  Patient Location: PACU  Anesthesia Type: General  Level of Consciousness: awake, oriented, sedated and patient cooperative  Airway & Oxygen Therapy: Patient Spontanous Breathing and Patient connected to face mask oxygen  Post-op Assessment: Report given to PACU RN and Post -op Vital signs reviewed and stable  Post vital signs: Reviewed and stable  Complications: No apparent anesthesia complications  Last Vitals:  Vitals:   05/18/16 0613 05/18/16 0851  BP: 136/66 (!) 146/91  Pulse: 63   Resp: 16 12  Temp: 37.1 C 36.3 C

## 2016-05-18 NOTE — H&P (View-Only) (Signed)
CC: I have ureteral stone.  HPI: The problem is on the left side. He first stated noticing pain on 05/01/2016. This is not his first kidney stone. He is currently having fever and chills. He denies having flank pain, back pain, groin pain, nausea, and vomiting. Pain is occuring on the left side. He has not caught a stone in his urine strainer since his symptoms began.   He has had ureteral stent and ureteroscopy for treatment of his stones in the past.   [name] [name] returns today following an episode of chills and fever to 100.4 that was associated with left flank pain. He has a 13mm stone on recent CT in the left distal ureter with hydro and he had multiple species on his culture from last week. He is afebrile now and feeling better but an [place] today still shows moderate hydronephrosis. He has stable incontinence and no hematuria.      ALLERGIES: No Allergies    MEDICATIONS: Myrbetriq 50 mg tablet, extended release 24 hr  Allegra Allergy  Caffeine  Meloxicam  Synthroid     GU PSH: Biopsy Skin Lesion - 2008 Cysto Uretero Lithotripsy - 2015 Cystoscopy And Treatment - 2012 Cystoscopy And Treatment - 2012 Cystoscopy Insert Stent - 2015 Locm 300-399Mg /Ml Iodine,1Ml - 04/19/2016 PLACE RT DEVICE/MARKER, PROS - 2008 Renal ESWL - 2008 Revise Bladder Neck - 2014      PSH Notes: Cystoscopy With Ureteroscopy With Lithotripsy, Cystoscopy With Insertion Of Ureteral Stent Left, Transurethral Resection Of Bladder Neck, Cystoscopy With Removal Of Ureteral Calculus Right, Cystoscopy, Ureteral Meatotomy, Treat Ureterocele Right, Lithotripsy, Prostate Place Interstitial Dev For Radiation Guide Multiple, Biopsy Skin, Leg Repair   NON-GU PSH: None   GU PMH: Kidney Stone, He has bilateral stones, right > left. - 04/26/2016, Kidney Stone, Bilateral kidney stones - 01/03/2016 Prostate Cancer (Worsening), The restaging studies showed no obvious mets despite the CRCP with rising psa. 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 - 04/06/2016 Rising PSA, Post Treatment - 04/06/2016 Urinary incontinence, Unspec - 04/06/2016 Urge incontinence, Urge incontinence of urine - 01/03/2016 Urinary Tract Inf, Unspec site, Urinary tract infection - 01/03/2016, Pyuria, - 12/09/2014 Stress Incontinence, M/F, Male stress incontinence - 10/04/2015, Male Stress Incontinence, - 2014 Hydronephrosis Unspec, Hydronephrosis, left - 2015 Calculus Ureter, Calculus of ureter - 2015 Elevated PSA, Elevated prostate specific antigen (PSA) - 2014 Bladder-neck obstruction, Bladder neck contracture - 2014 ED, arterial insufficiency, Erectile dysfunction due to arterial insufficiency - 2014 Gross hematuria, Gross Hematuria - 2014 History of prostate cancer, Prostate Cancer - 2014 Other microscopic hematuria, Microscopic Hematuria - 2014 Personal Hx urinary calculi, Nephrolithiasis - 2014 Polyuria, Polyuria - 2014      PMH Notes:  2007-10-25 16:14:38 - Note: Urinary Tract Infection  2006-12-11 15:58:45 - Note: Frequent, Small Amounts Of Urine  2006-10-12 14:13:58 - Note: Skin Cancer   NON-GU PMH: Other fatigue, Fatigue - 06/30/2015 Encounter for general adult medical examination without abnormal findings, Encounter for preventive health examination - 12/09/2014, Routine history and physical examination of adult, - 2014 Cellulitis of abdominal wall, Cellulitis of right abdominal wall - 08/24/2014 Cardiac murmur, unspecified, Murmurs - 2014 Muscle weakness (generalized), Muscle weakness - 2014 Other lack of coordination, Other lack of coordination - 2014 Personal history of other endocrine, nutritional and metabolic disease, History of hypothyroidism - 2014    FAMILY HISTORY: Cancer - Runs In Family Death In The Family Father - Father Death  In The Family Mother - Mother Diabetes - Runs In Family Family Health Status Number - Runs In Family    SOCIAL HISTORY: Marital Status: Married Current Smoking Status: Patient has never smoked.  Has never drank.  Does not drink caffeine.     Notes: Exercise Habits, Activities Of Daily Living, Self-reliant In Usual Daily Activities, Living Independently With Spouse, Drug Use, Never A Smoker, Tobacco Use, Occupation:, Alcohol Use, Caffeine Use, Marital History - Currently Married   REVIEW OF SYSTEMS:    GU Review Male:   Patient reports frequent urination, get up at night to urinate, and leakage of urine. Patient denies hard to postpone urination, burning/ pain with urination, stream starts and stops, trouble starting your stream, have to strain to urinate , erection problems, and penile pain.  Gastrointestinal (Upper):   Patient denies nausea, vomiting, and indigestion/ heartburn.  Gastrointestinal (Lower):   Patient reports diarrhea. Patient denies constipation.  Constitutional:   Patient reports fever, night sweats, and fatigue. Patient denies weight loss.  Skin:   Patient denies skin rash/ lesion and itching.  Eyes:   Patient denies blurred vision and double vision.  Ears/ Nose/ Throat:   Patient denies sore throat and sinus problems.  Hematologic/Lymphatic:   Patient reports easy bruising. Patient denies swollen glands.  Cardiovascular:   Patient denies leg swelling and chest pains.  Respiratory:   Patient denies cough and shortness of breath.  Endocrine:   Patient denies excessive thirst.  Musculoskeletal:   Patient reports back pain and joint pain.   Neurological:   Patient denies headaches and dizziness.  Psychologic:   Patient denies depression and anxiety.   VITAL SIGNS:      05/01/2016 11:32 AM  Weight 225 lb / 102.06 kg  BP 108/67 mmHg  Pulse 70 /min  Temperature 98.3 F / 37 C   MULTI-SYSTEM PHYSICAL EXAMINATION:    Constitutional: Well-nourished. No physical deformities. Normally developed. Good grooming.  Respiratory: No labored breathing, no use of accessory muscles.  CTA  Cardiovascular: Normal temperature, normal extremity pulses, no swelling, no varicosities. RRR without murmur.  Skin: No paleness, no jaundice, no cyanosis. No lesion, no ulcer, no rash.   Gastrointestinal: Obese abdomen. No mass, no tenderness, no rigidity.      PAST DATA REVIEWED:  Source Of History:  Patient  X-Ray Review: Renal Ultrasound (Limited): Reviewed Films. Left hydro persists    03/30/16 01/28/16 12/28/15 09/29/15 06/24/15 03/19/15 12/09/14 12/03/14  PSA  Total PSA 2.38 ng/dl 0.9 ng/dl 0.65  0.12  0.23  1.91  0.77  0.78     PROCEDURES:         Renal Ultrasound (Limited) - [id number]  Left Kidney: Length: 13.88 cm Depth: 5.89 cm Cortical Width: 1.42 cm Width: 6.63cm    Left Kidney/Ureter:  1)Moderate Hydro with Dilated Renal Pelvis measuring 3.17cm and Dilated Proximal Ureter with measurements of Upper to Mid ureter measuring 1.65cm, 1.26cm, and 0.95cm-----2)Stones - Upper Pole 0.94cm, Mid Pole 0.48cm, and Lower Pole 0.66cm and 0.69cm  Bladder:  PVR = 46.4ml---Unable to visualize either ureteral jet      He still has left hydro and possible renal stones.   Poor Fine Detail due to Body Habitus         Urinalysis w/Scope - 81001 Dipstick Dipstick Cont'd Micro  Specimen: Voided Bilirubin: Neg WBC/hpf: 20-40/hpf  Color: Yellow Ketones: Neg RBC/hpf: 20-40/hpf  Appearance: Cloudy Blood: 3+ Bacteria: Many (>50/hpf)  Specific Gravity: 1.020 Protein: 1+ Cystals: NS (Not Seen)  pH: 6.0  Urobilinogen: 0.2 Casts: NS (Not Seen)  Glucose: Neg Nitrites: Neg Trichomonas: Not Present    Leukocyte Esterase: 3+ Mucous: Not Present      Epithelial Cells: 0-5/hpf      Yeast: NS (Not Seen)      Sperm: Not Present    ASSESSMENT:      ICD-10 Details  1 GU:   Calculus Ureter - N20.1 Stable  2   Urinary Tract Inf, Unspec site - N39.0 Worsening   PLAN:           Orders X-Rays: Renal Ultrasound (Limited) - Left renal US today assess for hydro.          Schedule Return Visit:  ASAP - Schedule Surgery             Note: Needs cystoscopy and left stent today.         Notes:   He had fever and chills and persistent left hydro with a known left distal stone.   I am going to get him set up for placement of a left ureteral stent today and reviewed the risks of bleeding, infection, ureteral injury, need for secondary procedures, thrombotic events and anesthetic complications. He will be given Vanc and Norva Karvonen for prophyllaxis.   CC: Dr. Maury Dus.

## 2016-05-18 NOTE — Anesthesia Procedure Notes (Signed)
Procedure Name: LMA Insertion Date/Time: 05/18/2016 7:34 AM Performed by: Denna Haggard D Pre-anesthesia Checklist: Patient identified, Emergency Drugs available, Suction available and Patient being monitored Patient Re-evaluated:Patient Re-evaluated prior to inductionOxygen Delivery Method: Circle system utilized Preoxygenation: Pre-oxygenation with 100% oxygen Intubation Type: IV induction Ventilation: Mask ventilation without difficulty LMA: LMA inserted LMA Size: 4.0 Number of attempts: 1 Airway Equipment and Method: Bite block Placement Confirmation: positive ETCO2 Tube secured with: Tape Dental Injury: Teeth and Oropharynx as per pre-operative assessment

## 2016-05-18 NOTE — Brief Op Note (Signed)
05/18/2016  8:53 AM  PATIENT:  [name] [name]  82 y.o. male  PRE-OPERATIVE DIAGNOSIS:  LEFT DISTAL STONE POSSIBLE BLADDER STONE  POST-OPERATIVE DIAGNOSIS:  LEFT DISTAL URETERAL STONE, BLADDER STONE  PROCEDURE:  Procedure(s): CYSTOSCOPY WITH LITHOLAPAXY (N/A) CYSTOSCOPY WITH URETEROSCOPY (Left) HOLMIUM LASER APPLICATION (N/A) CYSTOSCOPY WITH STENT REPLACEMENT (Left) STONE EXTRACTION WITH BASKET (Left)  SURGEON:  Surgeon(s) and Role:    * [name], MD - Primary  PHYSICIAN ASSISTANT:   ASSISTANTS: none   ANESTHESIA:   general  EBL:  Total I/O In: 900 [I.V.:700; IV Piggyback:200] Out: 0   BLOOD ADMINISTERED:none  DRAINS: 57fr foley and 6 x 24 left JJ with tether.   LOCAL MEDICATIONS USED:  NONE  SPECIMEN:  Source of Specimen:  stones from bladder and left ureter  DISPOSITION OF SPECIMEN:  to patient  COUNTS:  YES  TOURNIQUET:  * No tourniquets in log *  DICTATION: .Other Dictation: Dictation Number [id number]  PLAN OF CARE: Discharge to home after PACU  PATIENT DISPOSITION:  PACU - hemodynamically stable.   Delay start of Pharmacological VTE agent (>24hrs) due to surgical blood loss or risk of bleeding: not applicable

## 2016-05-19 ENCOUNTER — Encounter (HOSPITAL_BASED_OUTPATIENT_CLINIC_OR_DEPARTMENT_OTHER): Payer: Self-pay | Admitting: Urology

## 2016-05-19 NOTE — Op Note (Signed)
NAME:  [name] [name], [name] [name] NO.:  [id number]  MEDICAL RECORD NO.:  [id number]  LOCATION:                                 FACILITY:  PHYSICIAN:  Marshall Cork. Jeffie Pollock, M.D.    DATE OF BIRTH:  02-17-1932  DATE OF PROCEDURE:  05/18/2016 DATE OF DISCHARGE:                              OPERATIVE REPORT   PROCEDURES: 1. Cystoscopy with second-stage cystolitholapaxy for greater than 2.5-     cm stone. 2. Removal of left double-J stent. 3. Left ureteroscopic stone extraction. 4. Insertion of left double-J stent.  PREOPERATIVE DIAGNOSIS:  Bladder stone greater than 2.5 cm and left ureteral stones.  SURGEON:  Marshall Cork. Jeffie Pollock, M.D.  ANESTHESIA:  General.  DRAINS:  A 6-French x24-cm left double-J stent with tether and 18-French Foley catheter.  SPECIMENS:  Stone fragments from ureter and bladder.  BLOOD LOSS:  None.  COMPLICATIONS:  None.  INDICATIONS:  Mr. Wolaver is an 82 year old white male with a complicated urologic history, who has bladder stones and left distal ureteral stone, who previously undergone cystoscopy with cystolitholapaxy with a large amount of prostatic urethral calculi followed by placement of left double-J stent for pyelonephrosis.  He returns today for completion of cystolitholapaxy and a left ureteroscopic stone extraction.  FINDINGS AND PROCEDURE:  He was taken to the operating room where he was given ampicillin and gentamicin.  A general anesthetic was induced.  He was placed in lithotomy position.  His perineum and genitalia were prepped with Betadine solution, he was draped in usual sterile fashion.  Cystoscopy was performed using a 23-French scope and 30-degree lens. Examination revealed a normal urethra.  The external sphincter was somewhat patulous.  There was a TUR defect in the prostate with some dystrophic calcification proximally.  Examination of the bladder revealed a stent exiting the left ureteral orifice.  The right ureteral orifice  was not visualized.  There were two stones in the bladder with a combined diameter of greater than 2.5 cm.  The bladder wall had radiation changes and mild trabeculation, no tumors were noted.  After initial cystoscopy, a 1000-micron laser fiber initially set on 1 watt and 50 hertz was used to engage the stone.  Early in this procedure, I changed to a laser bridge with continuous flow.  The stones were broken into manageable fragments and a large component of them was evacuated.  There were some fragments that were too large to remove through the laser scope, so that was removed.  Urethra was calibrated to 30-French and a 28-French continuous flow resectoscope sheath was inserted.  Using the scope, no additional larger fragments were removed. Eventually, additional lasering was performed to reduce the final fragments, which were then evacuated from the bladder.  Once the bladder was entirely evacuated free of stone material, this specimen was collected and the cystoscope was then reinserted.  Grasping forceps was used to grasp the stent, which was pulled to the urethral meatus.  A Sensor guidewire was passed to the kidney under fluoroscopic guidance and the cystoscope was then removed.  A 12-French introducer sheath inner core was then used to dilate the mid distal ureter.  A dual- lumen 6.5-French semirigid  ureteroscope was then passed easily up to ureteral orifice.  Inspection revealed greater than 20 small ureteral stones, largest appeared to be in the 5-6 mm range.  The larger ones were removed with a NGage basket.  The remaining small stones were removed using an Encompass basket.  I advanced the scope into the proximal ureter and removed nearly all the stones from this point down, couple of 1-2 mm fragments remained and were too small to retrieve.  Once all the stone fragments had been retrieved, the cystoscope was reinserted.  The ureteral stones, which had been placed in the  bladder were evacuated.  A 6-French 24-cm double-J stent with tether was then placed over the wire to the kidney under fluoroscopic guidance.  Once in good position, the cystoscope was removed leaving the stent string exiting urethra.  Then, an 18-French Foley catheter was inserted.  The reason for the Foley was there was one small laser injury on the posterior wall just to be on the safe side, although, there was no evidence of extravasation.  The Foley balloon was inflated with 10 mL of sterile fluid.  The catheter was placed to straight drainage.  The stent string was taped to the patient's penis.  A B and O suppository was placed.  He was taken down from lithotomy position.  His anesthetic was reversed.  He was moved to the recovery room in stable condition.  There were no complications.  The stone fragments will be given to the family.     Marshall Cork. Jeffie Pollock, M.D.   ______________________________ Marshall Cork. Jeffie Pollock, M.D.    JJW/MEDQ  D:  05/18/2016  T:  05/18/2016  Job:  [id number]

## 2016-05-26 DIAGNOSIS — N201 Calculus of ureter: Secondary | ICD-10-CM | POA: Diagnosis not present

## 2016-06-28 DIAGNOSIS — Z23 Encounter for immunization: Secondary | ICD-10-CM | POA: Diagnosis not present

## 2016-07-19 DIAGNOSIS — C61 Malignant neoplasm of prostate: Secondary | ICD-10-CM | POA: Diagnosis not present

## 2016-07-19 DIAGNOSIS — N200 Calculus of kidney: Secondary | ICD-10-CM | POA: Diagnosis not present

## 2016-07-19 DIAGNOSIS — N390 Urinary tract infection, site not specified: Secondary | ICD-10-CM | POA: Diagnosis not present

## 2016-07-28 ENCOUNTER — Other Ambulatory Visit (HOSPITAL_BASED_OUTPATIENT_CLINIC_OR_DEPARTMENT_OTHER): Payer: Medicare HMO

## 2016-07-28 ENCOUNTER — Ambulatory Visit (HOSPITAL_BASED_OUTPATIENT_CLINIC_OR_DEPARTMENT_OTHER): Payer: Medicare HMO | Admitting: Oncology

## 2016-07-28 ENCOUNTER — Telehealth: Payer: Self-pay | Admitting: Oncology

## 2016-07-28 VITALS — BP 138/56 | HR 75 | Temp 98.5°F | Resp 18 | Ht 63.0 in | Wt 229.7 lb

## 2016-07-28 DIAGNOSIS — C61 Malignant neoplasm of prostate: Secondary | ICD-10-CM

## 2016-07-28 DIAGNOSIS — C7951 Secondary malignant neoplasm of bone: Secondary | ICD-10-CM

## 2016-07-28 LAB — COMPREHENSIVE METABOLIC PANEL
ALBUMIN: 3.9 g/dL (ref 3.5–5.0)
ALK PHOS: 77 U/L (ref 40–150)
ALT: 13 U/L (ref 0–55)
AST: 14 U/L (ref 5–34)
Anion Gap: 9 mEq/L (ref 3–11)
BILIRUBIN TOTAL: 0.41 mg/dL (ref 0.20–1.20)
BUN: 29.7 mg/dL — AB (ref 7.0–26.0)
CALCIUM: 9.3 mg/dL (ref 8.4–10.4)
CO2: 22 mEq/L (ref 22–29)
CREATININE: 1.6 mg/dL — AB (ref 0.7–1.3)
Chloride: 107 mEq/L (ref 98–109)
EGFR: 40 mL/min/{1.73_m2} — ABNORMAL LOW (ref >90)
GLUCOSE: 101 mg/dL (ref 70–140)
POTASSIUM: 4.8 meq/L (ref 3.5–5.1)
SODIUM: 139 meq/L (ref 136–145)
TOTAL PROTEIN: 7.1 g/dL (ref 6.4–8.3)

## 2016-07-28 LAB — CBC WITH DIFFERENTIAL/PLATELET
BASO%: 0.6 % (ref 0.0–2.0)
BASOS ABS: 0 10*3/uL (ref 0.0–0.1)
EOS ABS: 0.2 10*3/uL (ref 0.0–0.5)
EOS%: 4.2 % (ref 0.0–7.0)
HEMATOCRIT: 37.2 % — AB (ref 38.4–49.9)
HEMOGLOBIN: 12.2 g/dL — AB (ref 13.0–17.1)
LYMPH#: 1.5 10*3/uL (ref 0.9–3.3)
LYMPH%: 26.4 % (ref 14.0–49.0)
MCH: 31.1 pg (ref 27.2–33.4)
MCHC: 32.8 g/dL (ref 32.0–36.0)
MCV: 95 fL (ref 79.3–98.0)
MONO#: 0.5 10*3/uL (ref 0.1–0.9)
MONO%: 9.6 % (ref 0.0–14.0)
NEUT%: 59.2 % (ref 39.0–75.0)
NEUTROS ABS: 3.3 10*3/uL (ref 1.5–6.5)
Platelets: 177 10*3/uL (ref 140–400)
RBC: 3.92 10*6/uL — ABNORMAL LOW (ref 4.20–5.82)
RDW: 14.3 % (ref 11.0–14.6)
WBC: 5.6 10*3/uL (ref 4.0–10.3)

## 2016-07-28 NOTE — Progress Notes (Signed)
Hematology and Oncology Follow Up Visit  [name] [name] [id number] 1932-08-03 82 y.o. 07/28/2016 3:33 PM [name] [name] [name], [name], MD   Principle Diagnosis: 82 year old gentleman diagnosed with prostate cancer in 1998 after presenting with a Gleason score of 9. 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   Prior Therapy: He did develop recurrent disease in October 2014 and required a channel TUR. He subsequently was started on firmagon which controlled his PSA reasonably well.    PSA was up to 1.91 in June 2016 after a rise from 0.78 in March 2016. Before that was 0.53 in November 2015.   Xtandi 160 mg daily started in August 2016. Therapy has been on hold since November 2016 due to poor tolerance. He had an excellent response with PSA dropping to 0.1.  Current therapy:  Androgen deprivation. Administered at [facility].  Interim History: [name] [name] is as today for a follow-up visit. Since the last visit, he reports developing an episode of pyelonephritis as well as ureteral stone that required an intervention in August 2017. He underwent cystoscopy and removal of his stone that he tolerated well under the care of Dr. Jeffie Pollock. Prior to that he was feeling poorly including fatigue and pain. He also completed staging workup for his malignancy including CT scan and bone scan that did not show any cancer recurrence.  Since that episode, he has been feeling very well and regained all activities of daily living. He continues to drive and ambulate with the help of a cane and had not had any falls or syncope. He does report some unsteadiness but had not had any seizures or falls. His quality of life remain unchanged and does not report any bone pain or pathological fractures.  He does not report any headaches, blurry vision, syncope or seizures. He does not report any fevers, chills, sweats or weight loss. He is not reporting any chest pain, palpitation, orthopnea or leg  edema. He does not report any cough or hemoptysis or hematemesis. He does not report any nausea, vomiting, abdominal pain, hematochezia. He does report incontinence but no hematuria, dysuria, nocturia. He does not report any skeletal complaints. Remainder review of systems unremarkable.   Medications: I have reviewed the patient's current medications.  Current Outpatient Prescriptions  Medication Sig Dispense Refill  . acetaminophen (TYLENOL) 325 MG tablet Take 650 mg by mouth every 6 (six) hours as needed for mild pain.     Marland Kitchen amoxicillin-clavulanate (AUGMENTIN) 500-125 MG tablet Take 1 tablet (500 mg total) by mouth 3 (three) times daily. 12 tablet 0  . bismuth subsalicylate (PEPTO BISMOL) 262 MG/15ML suspension Take 30 mLs by mouth every 6 (six) hours as needed.    . Cyanocobalamin (VITAMIN B 12 PO) Take 1 tablet by mouth 3 (three) times a week.    Marland Kitchen HYDROcodone-acetaminophen (NORCO/VICODIN) 5-325 MG tablet Take 1 tablet by mouth every 4 (four) hours as needed for moderate pain. 15 tablet 0  . levothyroxine (SYNTHROID, LEVOTHROID) 150 MCG tablet Take 150 mcg by mouth daily before breakfast.    . meloxicam (MOBIC) 15 MG tablet Take 15 mg by mouth daily.    . Multiple Vitamin (MULTIVITAMIN WITH MINERALS) TABS tablet Take 1 tablet by mouth every 7 (seven) days.    Marland Kitchen MYRBETRIQ 50 MG TB24 tablet Take 50 mg by mouth daily.  0   No current facility-administered medications for this visit.      Allergies: No Known Allergies  Past Medical History, Surgical history, Social history,  and Family History were reviewed and updated.   Physical Exam: Blood pressure (!) 138/56, pulse 75, temperature 98.5 F (36.9 C), temperature source Oral, resp. rate 18, height 5\' 3"  (1.6 m), weight 229 lb 11.2 oz (104.2 kg), SpO2 96 %. ECOG: 1 General appearance: Elderly gentleman appeared comfortable without distress. Head: Normocephalic, without obvious abnormality no oral thrush noted. Neck: no adenopathy or  masses. Lymph nodes: Cervical, supraclavicular, and axillary nodes normal. Heart:regular rate and rhythm, S1, S2 normal, no murmur, click, rub or gallop Lung:chest clear, no wheezing, rales, normal symmetric air entry.  Abdomin: soft, non-tender, without masses or organomegaly no shifting dullness or ascites. EXT:no erythema, induration, or nodules   Lab Results: Lab Results  Component Value Date   WBC 5.6 07/28/2016   HGB 12.2 (L) 07/28/2016   HCT 37.2 (L) 07/28/2016   MCV 95.0 07/28/2016   PLT 177 07/28/2016     Chemistry      Component Value Date/Time   NA 143 05/18/2016 0626   NA 142 04/28/2016 1441   K 4.3 05/18/2016 0626   K 4.4 04/28/2016 1441   CL 106 05/18/2016 0626   CO2 27 05/02/2016 0423   CO2 28 04/28/2016 1441   BUN 31 (H) 05/18/2016 0626   BUN 24.7 04/28/2016 1441   CREATININE 0.90 05/18/2016 0626   CREATININE 1.0 04/28/2016 1441      Component Value Date/Time   CALCIUM 8.5 (L) 05/02/2016 0423   CALCIUM 9.9 04/28/2016 1441   ALKPHOS 53 05/01/2016 2149   ALKPHOS 79 04/28/2016 1441   AST 18 05/01/2016 2149   AST 14 04/28/2016 1441   ALT 15 (L) 05/01/2016 2149   ALT 15 04/28/2016 1441   BILITOT 1.1 05/01/2016 2149   BILITOT 0.52 04/28/2016 1441      Results for [name] [name], [name] [name] (MRN [id number]) as of 07/28/2016 15:21  Ref. Range 11/26/2015 14:26 01/28/2016 14:43 04/28/2016 14:40  PSA Latest Ref Range: 0.0 - 4.0 ng/mL 0.4 0.9 3.4       Impression and Plan:   82 year old gentleman with the following issues:  1. Prostate cancer diagnosed in 1998 after presenting with a Gleason score 9 and received definitive therapy with seed implants. He developed recurrent disease in 2014 and have been on hormone therapy since 2015. He had a reasonable response initially and his PSA was up to 1.91 in June 2016 from 0.78 in March 2016. He is quite asymptomatic.   CT scan and bone scan obtained on 04/08/2015  showed stable disease with increased uptake at T8 vertebra and  lumbar spine which is suggestive of metastatic disease or possible arthritis. CT scan of the abdomen and pelvis did not show any visceral metastasis.  After brief treatment with Gillermina Phy, his PSA declined to 0.12 but tolerated the medication poorly. He is currently not receiving any treatment other than hormone therapy.  His last PSA On 04/28/2016 showed an increase up to 3.4. We have discussed treatment strategy at this time which include observation and surveillance versus restarting Xtandi or switching to [place]. He is open to retrying [place] a lower dose given the fact that his symptoms at that time were mostly related to a pyelonephritis rather than complications related to the medication. Given the fact that he has no measurable disease and he is asymptomatic from his malignancy he opted to hold off on restarting treatment at this time and we'll reassess him in 3 months. He understands different salvage therapy will be utilized in the near future given  his rapid rise in PSA.  2. Hormone therapy: I have recommended continuing that for the time being. This will need to be continued indefinitely.  3. Pyelonephritis and ureteral stone: Resolved at this time.  4. Follow-up: Will be in 3 months.   Zola Button, MD 11/3/20173:33 PM

## 2016-07-28 NOTE — Telephone Encounter (Signed)
Appointment scheduled per 07/28/16 los. AVS report and appointment schedule given to patient per 07/28/16 los.

## 2016-07-30 LAB — PSA: PROSTATE SPECIFIC AG, SERUM: 7.6 ng/mL — AB (ref 0.0–4.0)

## 2016-09-12 ENCOUNTER — Telehealth: Payer: Self-pay | Admitting: Oncology

## 2016-09-12 NOTE — Telephone Encounter (Signed)
MAILED RECORDS TO [facility]-RISK ADJUSTMENT °

## 2016-09-12 NOTE — Telephone Encounter (Signed)
Mailed records [facility]/ risk adjustment

## 2016-10-16 DIAGNOSIS — C61 Malignant neoplasm of prostate: Secondary | ICD-10-CM | POA: Diagnosis not present

## 2016-10-19 DIAGNOSIS — R9721 Rising PSA following treatment for malignant neoplasm of prostate: Secondary | ICD-10-CM | POA: Diagnosis not present

## 2016-10-19 DIAGNOSIS — N393 Stress incontinence (female) (male): Secondary | ICD-10-CM | POA: Diagnosis not present

## 2016-10-19 DIAGNOSIS — N390 Urinary tract infection, site not specified: Secondary | ICD-10-CM | POA: Diagnosis not present

## 2016-10-19 DIAGNOSIS — C61 Malignant neoplasm of prostate: Secondary | ICD-10-CM | POA: Diagnosis not present

## 2016-10-24 ENCOUNTER — Other Ambulatory Visit: Payer: Self-pay | Admitting: *Deleted

## 2016-10-26 ENCOUNTER — Other Ambulatory Visit: Payer: Self-pay | Admitting: Urology

## 2016-10-26 DIAGNOSIS — C61 Malignant neoplasm of prostate: Secondary | ICD-10-CM | POA: Diagnosis not present

## 2016-10-26 DIAGNOSIS — Z5111 Encounter for antineoplastic chemotherapy: Secondary | ICD-10-CM | POA: Diagnosis not present

## 2016-10-27 ENCOUNTER — Encounter: Payer: Self-pay | Admitting: *Deleted

## 2016-11-03 ENCOUNTER — Ambulatory Visit (HOSPITAL_BASED_OUTPATIENT_CLINIC_OR_DEPARTMENT_OTHER): Payer: Medicare HMO | Admitting: Oncology

## 2016-11-03 ENCOUNTER — Other Ambulatory Visit (HOSPITAL_BASED_OUTPATIENT_CLINIC_OR_DEPARTMENT_OTHER): Payer: Medicare HMO

## 2016-11-03 ENCOUNTER — Telehealth: Payer: Self-pay | Admitting: Oncology

## 2016-11-03 VITALS — BP 156/79 | HR 76 | Temp 99.7°F | Resp 17 | Ht 63.0 in | Wt 234.4 lb

## 2016-11-03 DIAGNOSIS — C7951 Secondary malignant neoplasm of bone: Secondary | ICD-10-CM

## 2016-11-03 DIAGNOSIS — C61 Malignant neoplasm of prostate: Secondary | ICD-10-CM

## 2016-11-03 LAB — COMPREHENSIVE METABOLIC PANEL
ALBUMIN: 3.9 g/dL (ref 3.5–5.0)
ALK PHOS: 73 U/L (ref 40–150)
ALT: 18 U/L (ref 0–55)
ANION GAP: 8 meq/L (ref 3–11)
AST: 18 U/L (ref 5–34)
BUN: 26.2 mg/dL — AB (ref 7.0–26.0)
CALCIUM: 9.2 mg/dL (ref 8.4–10.4)
CHLORIDE: 108 meq/L (ref 98–109)
CO2: 25 mEq/L (ref 22–29)
CREATININE: 1.1 mg/dL (ref 0.7–1.3)
EGFR: 62 mL/min/{1.73_m2} — ABNORMAL LOW (ref >90)
Glucose: 101 mg/dl (ref 70–140)
POTASSIUM: 4.7 meq/L (ref 3.5–5.1)
Sodium: 141 mEq/L (ref 136–145)
Total Bilirubin: 0.35 mg/dL (ref 0.20–1.20)
Total Protein: 6.8 g/dL (ref 6.4–8.3)

## 2016-11-03 LAB — CBC WITH DIFFERENTIAL/PLATELET
BASO%: 0.4 % (ref 0.0–2.0)
BASOS ABS: 0 10*3/uL (ref 0.0–0.1)
EOS ABS: 0.2 10*3/uL (ref 0.0–0.5)
EOS%: 4 % (ref 0.0–7.0)
HEMATOCRIT: 35.9 % — AB (ref 38.4–49.9)
HEMOGLOBIN: 11.9 g/dL — AB (ref 13.0–17.1)
LYMPH#: 1.5 10*3/uL (ref 0.9–3.3)
LYMPH%: 29.2 % (ref 14.0–49.0)
MCH: 31.5 pg (ref 27.2–33.4)
MCHC: 33.1 g/dL (ref 32.0–36.0)
MCV: 95 fL (ref 79.3–98.0)
MONO#: 0.5 10*3/uL (ref 0.1–0.9)
MONO%: 9.2 % (ref 0.0–14.0)
NEUT#: 3 10*3/uL (ref 1.5–6.5)
NEUT%: 57.2 % (ref 39.0–75.0)
PLATELETS: 162 10*3/uL (ref 140–400)
RBC: 3.78 10*6/uL — ABNORMAL LOW (ref 4.20–5.82)
RDW: 13.4 % (ref 11.0–14.6)
WBC: 5.2 10*3/uL (ref 4.0–10.3)

## 2016-11-03 NOTE — Progress Notes (Signed)
Hematology and Oncology Follow Up Visit  WILMOT TWIFORD [id number] 03-23-1932 83 y.o. 11/03/2016 2:31 PM [name],ROBERT [name], [name], MD   Principle Diagnosis: 83 year old gentleman diagnosed with prostate cancer in 1998 after presenting with a Gleason score of 9. 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   Prior Therapy: He did develop recurrent disease in October 2014 and required a channel TUR. He subsequently was started on firmagon which controlled his PSA reasonably well.    PSA was up to 1.91 in June 2016 after a rise from 0.78 in March 2016. Before that was 0.53 in November 2015.   Xtandi 160 mg daily started in August 2016. Therapy has been on hold since November 2016 due to poor tolerance. He had an excellent response with PSA dropping to 0.1.  Current therapy:  Androgen deprivation. Administered at [facility].  Interim History: Mr. Ericksen is as today for a follow-up visit. Since the last visit, he reports no major changes in his health. He remains in reasonable health and mobile with the help of a cane. He is no longer reporting any hematuria or dysuria. He does report periodic incontinence. He still able to drive and attends to activities of daily living. Has not reported any bone pain or pathological fractures. His appetite remain excellent. He denies any excessive fatigue or tiredness.   He does not report any headaches, blurry vision, syncope or seizures. He does not report any fevers, chills, sweats or weight loss. He is not reporting any chest pain, palpitation, orthopnea or leg edema. He does not report any cough or hemoptysis or hematemesis. He does not report any nausea, vomiting, abdominal pain, hematochezia. He does report incontinence but no hematuria, dysuria, nocturia. He does not report any skeletal complaints. Remainder review of systems unremarkable.   Medications: I have reviewed the patient's current medications.  Current Outpatient  Prescriptions  Medication Sig Dispense Refill  . acetaminophen (TYLENOL) 325 MG tablet Take 650 mg by mouth every 6 (six) hours as needed for mild pain.     Marland Kitchen amoxicillin-clavulanate (AUGMENTIN) 500-125 MG tablet Take 1 tablet (500 mg total) by mouth 3 (three) times daily. 12 tablet 0  . bismuth subsalicylate (PEPTO BISMOL) 262 MG/15ML suspension Take 30 mLs by mouth every 6 (six) hours as needed.    . Cyanocobalamin (VITAMIN B 12 PO) Take 1 tablet by mouth 3 (three) times a week.    Marland Kitchen HYDROcodone-acetaminophen (NORCO/VICODIN) 5-325 MG tablet Take 1 tablet by mouth every 4 (four) hours as needed for moderate pain. 15 tablet 0  . levothyroxine (SYNTHROID, LEVOTHROID) 150 MCG tablet Take 150 mcg by mouth daily before breakfast.    . meloxicam (MOBIC) 15 MG tablet Take 15 mg by mouth daily.    . Multiple Vitamin (MULTIVITAMIN WITH MINERALS) TABS tablet Take 1 tablet by mouth every 7 (seven) days.    Marland Kitchen MYRBETRIQ 50 MG TB24 tablet Take 50 mg by mouth daily.  0   No current facility-administered medications for this visit.      Allergies: No Known Allergies  Past Medical History, Surgical history, Social history, and Family History were reviewed and updated.   Physical Exam: Blood pressure (!) 156/79, pulse 76, temperature 99.7 F (37.6 C), temperature source Oral, resp. rate 17, height 5\' 3"  (1.6 m), weight 234 lb 6.4 oz (106.3 kg), SpO2 97 %. ECOG: 1 General appearance: Alert, awake gentleman without distress. Head: Normocephalic, without obvious abnormality no oral thrush or ulcers. Neck: no adenopathy or thyroid  abnormalities. Lymph nodes: Cervical, supraclavicular, and axillary nodes normal. Heart:regular rate and rhythm, S1, S2 normal, no murmur, click, rub or gallop Lung:chest clear, no wheezing, rales, normal symmetric air entry.  Abdomin: soft, non-tender, without masses or organomegaly no organomegaly. EXT:no erythema, induration, or nodules   Lab Results: Lab Results   Component Value Date   WBC 5.2 11/03/2016   HGB 11.9 (L) 11/03/2016   HCT 35.9 (L) 11/03/2016   MCV 95.0 11/03/2016   PLT 162 11/03/2016     Chemistry      Component Value Date/Time   NA 139 07/28/2016 1504   K 4.8 07/28/2016 1504   CL 106 05/18/2016 0626   CO2 22 07/28/2016 1504   BUN 29.7 (H) 07/28/2016 1504   CREATININE 1.6 (H) 07/28/2016 1504      Component Value Date/Time   CALCIUM 9.3 07/28/2016 1504   ALKPHOS 77 07/28/2016 1504   AST 14 07/28/2016 1504   ALT 13 07/28/2016 1504   BILITOT 0.41 07/28/2016 1504     Results for RICHARDS, SNIPE (MRN [id number]) as of 11/03/2016 14:24  Ref. Range 04/28/2016 14:40 07/28/2016 15:04  PSA Latest Ref Range: 0.0 - 4.0 ng/mL 3.4 7.6 (H)    Impression and Plan:   83 year old gentleman with the following issues:  1. Prostate cancer diagnosed in 1998 after presenting with a Gleason score 9 and received definitive therapy with seed implants. He developed recurrent disease in 2014 and have been on hormone therapy since 2015. He had a reasonable response initially and his PSA was up to 1.91 in June 2016 from 0.78 in March 2016. He is quite asymptomatic.   CT scan and bone scan obtained on 04/08/2015  showed stable disease with increased uptake at T8 vertebra and lumbar spine which is suggestive of metastatic disease or possible arthritis. CT scan of the abdomen and pelvis did not show any visceral metastasis.  After brief treatment with Gillermina Phy, his PSA declined to 0.12 but tolerated the medication poorly. He is currently not receiving any treatment other than hormone therapy.  His bone scan obtained on 04/19/2016 did not show any clear-cut metastatic disease.   Risks and benefits of restarting Xtandi or Zytiga were reviewed today and we have opted to continued observation and surveillance given his lack of symptoms. His quality of life is reasonable and has declined on previous treatments with Xtandi. We will continue to monitor him  closely and consider systemic therapy. He develops symptomatic progression.  2. Hormone therapy: I have recommended continuing that for the time being. This will need to be continued indefinitely.  3. Pyelonephritis and ureteral stone: Resolved at this time.  4. Follow-up: Will be in 3 months.   Zola Button, MD 2/9/20182:31 PM

## 2016-11-03 NOTE — Telephone Encounter (Signed)
Appointments scheduled per 2/9 LOS. Patient given AVS report and calendars with future scheduled appointments.  °

## 2016-11-04 LAB — PSA: PROSTATE SPECIFIC AG, SERUM: 16.2 ng/mL — AB (ref 0.0–4.0)

## 2016-11-04 LAB — TESTOSTERONE: TESTOSTERONE: 55 ng/dL — AB (ref 264–916)

## 2016-11-14 ENCOUNTER — Encounter (HOSPITAL_COMMUNITY)
Admission: RE | Admit: 2016-11-14 | Discharge: 2016-11-14 | Disposition: A | Discharge status: Home / Self Care | Payer: Medicare HMO | Source: Ambulatory Visit | Attending: Urology | Admitting: Urology

## 2016-11-14 DIAGNOSIS — C61 Malignant neoplasm of prostate: Secondary | ICD-10-CM | POA: Diagnosis not present

## 2016-11-14 MED ORDER — AXUMIN (FLUCICLOVINE F 18) INJECTION
9.7500 | Freq: Once | INTRAVENOUS | Status: AC
  Administered 2016-11-14: 9.75 via INTRAVENOUS

## 2016-11-22 DIAGNOSIS — N3941 Urge incontinence: Secondary | ICD-10-CM | POA: Diagnosis not present

## 2016-11-22 DIAGNOSIS — C61 Malignant neoplasm of prostate: Secondary | ICD-10-CM | POA: Diagnosis not present

## 2016-11-22 DIAGNOSIS — N1330 Unspecified hydronephrosis: Secondary | ICD-10-CM | POA: Diagnosis not present

## 2016-11-22 DIAGNOSIS — N200 Calculus of kidney: Secondary | ICD-10-CM | POA: Diagnosis not present

## 2016-11-22 DIAGNOSIS — N390 Urinary tract infection, site not specified: Secondary | ICD-10-CM | POA: Diagnosis not present

## 2016-11-24 ENCOUNTER — Other Ambulatory Visit: Payer: Self-pay | Admitting: Urology

## 2016-11-29 ENCOUNTER — Encounter (HOSPITAL_BASED_OUTPATIENT_CLINIC_OR_DEPARTMENT_OTHER): Payer: Self-pay | Admitting: *Deleted

## 2016-11-29 NOTE — Progress Notes (Signed)
SPOKE W/ PT'S WIFE.  NPO AFTER MN.  ARRIVE 1015.  NEEDS HG.  CURRENT EKG IN CHART AND EPIC.  WILL TAKE SYNTHROID AM DOS W/ SIPS OF WATER.

## 2016-12-04 NOTE — H&P (Signed)
CC: I have prostate cancer.  HPI: [name] [name] is a 83 year-old male established patient who is here evaluation for treatment of prostate cancer.  His most recent PSA is 15.8.   He has undergone surgery for treatment. He has undergone External Beam Radiation Therapy for treatment. He has undergone Hormonal Therapy for treatment.   He does have urinary incontinence. He has not recently had unwanted weight loss. He is not having pain in new locations.   [name] [name] returns today in f/u. He has CRCP on Trelstar 11.25mg  q75months with his last in October and has been tried on [place] 4 daily but stopped it for fatigue in 10/16. His PSA has been rising and restaging studies with CT and bonescan were done in 7/17. They were negative for mets. His PSA is up to 15.8 from 5.36 over the last 3 months. He had an Axumin PET on 2/20 that shows progressive nodal metastatic disease and increased uptake at the left prostate/bladder base with left ureteral obstruction with a possible distal stone. There is a THR that degrades the image in the pelvis. He has no flank pain. His urine is chronically abnormal.  He has stable voiding symptoms with incontinence. He has no dysuria or hematuria.   His PSA fell from 0.41 to 0.12 in December but it has been rising and is up to 2.38 prior to this visit from 0.9 in May and 0.65 in April. He remains on Trelstar 11.25mg  q3 months for the recurrent Gleason 9 prostate cancer initially treated with seeds remotely. He had a Channel TUR for recurrent disease in 10/14 and then had a stone removed in 2015 after the PSA had fallen on firmagon.       Past Medical History       CC: I have ureteral stone.  HPI: He first stated noticing pain on 11/22/2016. This is not his first kidney stone.   He has had ureteral stent and ureteroscopy for treatment of his stones in the past.   The PET/CT shows a possible left distal stone but malignant obstruction is a possibility as well.   GU  hx: [name] [name] had a staged cystolithalopaxy for multiple large bladder stones and left ureteroscopy for a ureteral stone in August. He remains on TMP for suppression and uses Myrbetriq.     ALLERGIES: No Allergies    MEDICATIONS: Myrbetriq 50 mg tablet, extended release 24 hr  Allegra Allergy  Caffeine  Diazepam 10 mg tablet 1-2 tablet PO Daily Take one hour prior to procedure.  Meloxicam  Synthroid     GU PSH: Biopsy Skin Lesion - 2008 Cysto Bladder Stone >2.5cm - 05/18/2016, 05/01/2016 Cysto Uretero Lithotripsy - 2015 Cystoscopy And Treatment - 2012 Cystoscopy And Treatment - 2012 Cystoscopy Insert Stent, Left - 05/18/2016, Left - 05/01/2016, 2015 ESWL - 2008 Locm 300-399Mg /Ml Iodine,1Ml - 04/19/2016 PLACE RT DEVICE/MARKER, PROS - 2008 Revise Bladder Neck - 2014 Ureteroscopic stone removal, Left - 05/18/2016      PSH Notes: Cystoscopy With Ureteroscopy With Lithotripsy, Cystoscopy With Insertion Of Ureteral Stent Left, Transurethral Resection Of Bladder Neck, Cystoscopy With Removal Of Ureteral Calculus Right, Cystoscopy, Ureteral Meatotomy, Treat Ureterocele Right, Lithotripsy, Prostate Place Interstitial Dev For Radiation Guide Multiple, Biopsy Skin, Leg Repair   NON-GU PSH: None   GU PMH: Kidney Stone - 10/19/2016, (Stable), - 07/19/2016, He has bilateral stones, right > left., - 04/26/2016, Bilateral kidney stones, - 01/03/2016 Prostate Cancer (Worsening) - 07/19/2016, (Worsening), The restaging studies showed no obvious mets despite the  CRCP with rising psa. 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 - 04/06/2016 Rising PSA, Post Treatment - 04/06/2016 Urinary incontinence, Unspec - 04/06/2016 Urge incontinence, Urge incontinence of urine - 01/03/2016 Urinary Tract Inf, Unspec site, Urinary tract infection - 01/03/2016, Pyuria, -  12/09/2014 Stress Incontinence, M/F, Male stress incontinence - 10/04/2015, Male Stress Incontinence, - 2014 Hydronephrosis Unspec, Hydronephrosis, left - 2015 Elevated PSA, Elevated prostate specific antigen (PSA) - 2014 Bladder-neck obstruction, Bladder neck contracture - 2014 ED, arterial insufficiency, Erectile dysfunction due to arterial insufficiency - 2014 Gross hematuria, Gross Hematuria - 2014 Other microscopic hematuria, Microscopic Hematuria - 2014 Personal Hx urinary calculi, Nephrolithiasis - 2014 Polyuria, Polyuria - 2014 Prostate Cancer, History, Prostate Cancer - 2014      PMH Notes:  2007-10-25 16:14:38 - Note: Urinary Tract Infection  2006-12-11 15:58:45 - Note: Frequent, Small Amounts Of Urine  2006-10-12 14:13:58 - Note: Skin Cancer   NON-GU PMH: Other fatigue, Fatigue - 06/30/2015 Encounter for general adult medical examination without abnormal findings, Encounter for preventive health examination - 12/09/2014, Routine history and physical examination of adult, - 2014 Cellulitis of abdominal wall, Cellulitis of right abdominal wall - 08/24/2014 Cardiac murmur, unspecified, Murmurs - 2014 Muscle weakness (generalized), Muscle weakness - 2014 Other lack of coordination, Other lack of coordination - 2014 Personal history of other endocrine, nutritional and metabolic disease, History of hypothyroidism - 2014    FAMILY HISTORY: Cancer - Runs In Family Death In The Family Father - Father Death In The Family Mother - Mother Diabetes - Runs In Family Family Health Status Number - Runs In Family   SOCIAL HISTORY: Marital Status: Married Current Smoking Status: Patient has never smoked.  Has never drank.  Does not drink caffeine.     Notes: Exercise Habits, Activities Of Daily Living, Self-reliant In Usual Daily Activities, Living Independently With Spouse, Drug Use, Never A Smoker, Tobacco Use, Occupation:, Alcohol Use, Caffeine Use, Marital History - Currently Married    REVIEW OF SYSTEMS:    GU Review Male:   Patient reports leakage of urine, hard to postpone urination, and frequent urination. Patient denies have to strain to urinate , burning/ pain with urination, penile pain, erection problems, stream starts and stops, trouble starting your stream, and get up at night to urinate.  Gastrointestinal (Upper):   Patient denies nausea, vomiting, and indigestion/ heartburn.  Gastrointestinal (Lower):   Patient denies diarrhea and constipation.  Constitutional:   Patient denies fever, night sweats, weight loss, and fatigue.  Skin:   Patient denies skin rash/ lesion and itching.  Eyes:   Patient denies blurred vision and double vision.  Ears/ Nose/ Throat:   Patient denies sore throat and sinus problems.  Hematologic/Lymphatic:   Patient denies swollen glands and easy bruising.  Cardiovascular:   Patient denies leg swelling and chest pains.  Respiratory:   Patient denies cough and shortness of breath.  Endocrine:   Patient denies excessive thirst.  Musculoskeletal:   no bone pain. Patient denies back pain and joint pain.  Neurological:   Patient denies headaches and dizziness.  Psychologic:   Patient denies depression and anxiety.   VITAL SIGNS: None   PAST DATA REVIEWED:  Source Of History:  Patient  Lab Test Review:   LFT, BMP  Records Review:   Previous Doctor Records  Urine Test Review:   Urinalysis  X-Ray Review:  PET Scan: Reviewed Films. Reviewed Report. Discussed With Patient. worsening mets with left ureteral obstruction.    10/16/16 07/12/16 03/30/16 01/28/16 12/28/15 09/29/15 06/24/15 03/19/15  PSA  Total PSA 15.70 ng/dl 5.36  2.38 ng/dl 0.9 ng/dl 0.65  0.12  0.23  1.91     04/06/16 03/30/16 06/24/15 03/18/15 12/09/14 08/15/14 04/14/14 01/20/14  Hormones  Testosterone, Total 30.9 pg/dL 37.1 pg/dL 54  41  25  35  39  60    Notes:                     Recent notes from 11/03/16 from Dr. Alen Blew. BMET and LFT's were good on 2/9. Cr was normal.    PROCEDURES:          Urinalysis w/Scope Dipstick Dipstick Cont'd Micro  Color: Yellow Bilirubin: Neg WBC/hpf: 10 - 20/hpf  Appearance: Cloudy Ketones: Neg RBC/hpf: 20 - 40/hpf  Specific Gravity: 1.020 Blood: 3+ Bacteria: Many (>50/hpf)  pH: 6.5 Protein: 1+ Cystals: NS (Not Seen)  Glucose: Neg Urobilinogen: 0.2 Casts: NS (Not Seen)    Nitrites: Positive Trichomonas: Not Present    Leukocyte Esterase: Neg Mucous: Not Present      Epithelial Cells: 0 - 5/hpf      Yeast: NS (Not Seen)      Sperm: Not Present    ASSESSMENT:      ICD-10 Details  1 GU:   Prostate Cancer - C61 Worsening - He has progressive CRCP with worsening disease off of Xtandi and he has new left hydro that could be from a stone or malignant obstruction. I am going to have Dr. Alen Blew consider resuming Gillermina Phy or starting [place].   2   Kidney Stone - N20.0 I will get him set up for possible ureteroscopy and stent for the left hydro.   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 Urine culture today and continue TMP.   5   Urge incontinence - N39.41 Stable - I have given him samples of Myrbetriq 50mg .  6   Stress Incontinence, M/F - N39.3 Stable  7   Hydronephrosis Unspec - N13.30 Left     PLAN:            Medications New Meds: Trimethoprim 100 mg tablet 1 tablet PO Q HS   #30  11 Refill(s)            Orders Labs Urine Culture and Sensitivity          Schedule Return Visit/Planned Activity: Next Available Appointment - Schedule Surgery             Note: Needs left ureteroscopy and stent.

## 2016-12-05 ENCOUNTER — Encounter (HOSPITAL_BASED_OUTPATIENT_CLINIC_OR_DEPARTMENT_OTHER): Payer: Self-pay | Admitting: *Deleted

## 2016-12-05 ENCOUNTER — Encounter (HOSPITAL_BASED_OUTPATIENT_CLINIC_OR_DEPARTMENT_OTHER)
Admission: RE | Disposition: A | Discharge status: Home / Self Care | Payer: Self-pay | Source: Ambulatory Visit | Attending: Urology

## 2016-12-05 ENCOUNTER — Ambulatory Visit (HOSPITAL_BASED_OUTPATIENT_CLINIC_OR_DEPARTMENT_OTHER): Payer: Medicare HMO | Admitting: Anesthesiology

## 2016-12-05 ENCOUNTER — Other Ambulatory Visit: Payer: Self-pay | Admitting: Oncology

## 2016-12-05 ENCOUNTER — Telehealth: Payer: Self-pay | Admitting: Oncology

## 2016-12-05 ENCOUNTER — Ambulatory Visit (HOSPITAL_BASED_OUTPATIENT_CLINIC_OR_DEPARTMENT_OTHER)
Admission: RE | Admit: 2016-12-05 | Discharge: 2016-12-05 | Disposition: A | Discharge status: Home / Self Care | Payer: Medicare HMO | Source: Ambulatory Visit | Attending: Urology | Admitting: Urology

## 2016-12-05 DIAGNOSIS — N3946 Mixed incontinence: Secondary | ICD-10-CM | POA: Diagnosis not present

## 2016-12-05 DIAGNOSIS — N132 Hydronephrosis with renal and ureteral calculous obstruction: Secondary | ICD-10-CM | POA: Insufficient documentation

## 2016-12-05 DIAGNOSIS — N1330 Unspecified hydronephrosis: Secondary | ICD-10-CM | POA: Diagnosis not present

## 2016-12-05 DIAGNOSIS — C61 Malignant neoplasm of prostate: Secondary | ICD-10-CM | POA: Diagnosis not present

## 2016-12-05 DIAGNOSIS — Z79899 Other long term (current) drug therapy: Secondary | ICD-10-CM | POA: Insufficient documentation

## 2016-12-05 DIAGNOSIS — Z791 Long term (current) use of non-steroidal anti-inflammatories (NSAID): Secondary | ICD-10-CM | POA: Diagnosis not present

## 2016-12-05 DIAGNOSIS — E039 Hypothyroidism, unspecified: Secondary | ICD-10-CM | POA: Insufficient documentation

## 2016-12-05 DIAGNOSIS — Z923 Personal history of irradiation: Secondary | ICD-10-CM | POA: Insufficient documentation

## 2016-12-05 DIAGNOSIS — Z6841 Body Mass Index (BMI) 40.0 and over, adult: Secondary | ICD-10-CM | POA: Diagnosis not present

## 2016-12-05 DIAGNOSIS — Z87442 Personal history of urinary calculi: Secondary | ICD-10-CM | POA: Insufficient documentation

## 2016-12-05 HISTORY — DX: Unspecified right bundle-branch block: I45.10

## 2016-12-05 HISTORY — PX: CYSTOSCOPY/RETROGRADE/URETEROSCOPY/STONE EXTRACTION WITH BASKET: SHX5317

## 2016-12-05 HISTORY — DX: Unspecified hydronephrosis: N13.30

## 2016-12-05 HISTORY — DX: Malignant neoplasm of prostate: C61

## 2016-12-05 LAB — POCT HEMOGLOBIN-HEMACUE: HEMOGLOBIN: 11.7 g/dL — AB (ref 13.0–17.0)

## 2016-12-05 SURGERY — CYSTOSCOPY, WITH CALCULUS REMOVAL USING BASKET
Anesthesia: General

## 2016-12-05 MED ORDER — PROPOFOL 10 MG/ML IV BOLUS
INTRAVENOUS | Status: AC
  Filled 2016-12-05: qty 20

## 2016-12-05 MED ORDER — LIDOCAINE 2% (20 MG/ML) 5 ML SYRINGE
INTRAMUSCULAR | Status: DC | PRN
  Administered 2016-12-05: 100 mg via INTRAVENOUS

## 2016-12-05 MED ORDER — PROPOFOL 10 MG/ML IV BOLUS
INTRAVENOUS | Status: DC | PRN
  Administered 2016-12-05: 150 mg via INTRAVENOUS
  Administered 2016-12-05: 20 mg via INTRAVENOUS

## 2016-12-05 MED ORDER — ONDANSETRON HCL 4 MG/2ML IJ SOLN
INTRAMUSCULAR | Status: AC
  Filled 2016-12-05: qty 2

## 2016-12-05 MED ORDER — EPHEDRINE SULFATE-NACL 50-0.9 MG/10ML-% IV SOSY
PREFILLED_SYRINGE | INTRAVENOUS | Status: DC | PRN
  Administered 2016-12-05: 5 mg via INTRAVENOUS
  Administered 2016-12-05 (×2): 10 mg via INTRAVENOUS
  Administered 2016-12-05: 5 mg via INTRAVENOUS

## 2016-12-05 MED ORDER — FENTANYL CITRATE (PF) 100 MCG/2ML IJ SOLN
INTRAMUSCULAR | Status: DC | PRN
  Administered 2016-12-05 (×2): 25 ug via INTRAVENOUS
  Administered 2016-12-05: 50 ug via INTRAVENOUS

## 2016-12-05 MED ORDER — FENTANYL CITRATE (PF) 100 MCG/2ML IJ SOLN
INTRAMUSCULAR | Status: AC
  Filled 2016-12-05: qty 2

## 2016-12-05 MED ORDER — ONDANSETRON HCL 4 MG/2ML IJ SOLN
INTRAMUSCULAR | Status: DC | PRN
  Administered 2016-12-05: 4 mg via INTRAVENOUS

## 2016-12-05 MED ORDER — LIDOCAINE 2% (20 MG/ML) 5 ML SYRINGE
INTRAMUSCULAR | Status: AC
  Filled 2016-12-05: qty 5

## 2016-12-05 MED ORDER — DEXTROSE 5 % IV SOLN
INTRAVENOUS | Status: AC
  Filled 2016-12-05: qty 50

## 2016-12-05 MED ORDER — STERILE WATER FOR IRRIGATION IR SOLN
Status: DC | PRN
  Administered 2016-12-05: 6000 mL

## 2016-12-05 MED ORDER — DEXTROSE 5 % IV SOLN
2.0000 g | INTRAVENOUS | Status: AC
  Administered 2016-12-05: 2 g via INTRAVENOUS
  Filled 2016-12-05: qty 2

## 2016-12-05 MED ORDER — CEFTRIAXONE SODIUM 2 G IJ SOLR
INTRAMUSCULAR | Status: AC
  Filled 2016-12-05: qty 2

## 2016-12-05 MED ORDER — DEXAMETHASONE SODIUM PHOSPHATE 10 MG/ML IJ SOLN
INTRAMUSCULAR | Status: AC
  Filled 2016-12-05: qty 1

## 2016-12-05 MED ORDER — LACTATED RINGERS IV SOLN
INTRAVENOUS | Status: DC
  Administered 2016-12-05: 11:00:00 via INTRAVENOUS
  Filled 2016-12-05: qty 1000

## 2016-12-05 MED ORDER — DEXAMETHASONE SODIUM PHOSPHATE 4 MG/ML IJ SOLN
INTRAMUSCULAR | Status: DC | PRN
  Administered 2016-12-05: 10 mg via INTRAVENOUS

## 2016-12-05 SURGICAL SUPPLY — 35 items
BAG DRAIN URO-CYSTO SKYTR STRL (DRAIN) ×2 IMPLANT
BAG DRN UROCATH (DRAIN) ×1
BASKET LASER NITINOL 1.9FR (BASKET) IMPLANT
BASKET ZERO TIP NITINOL 2.4FR (BASKET) IMPLANT
BSKT STON RTRVL 120 1.9FR (BASKET)
BSKT STON RTRVL ZERO TP 2.4FR (BASKET)
CATH FOLEY 2WAY SLVR 30CC 20FR (CATHETERS) ×1 IMPLANT
CATH URET 5FR 28IN CONE TIP (BALLOONS)
CATH URET 5FR 28IN OPEN ENDED (CATHETERS) ×1 IMPLANT
CATH URET 5FR 70CM CONE TIP (BALLOONS) IMPLANT
CLOTH BEACON ORANGE TIMEOUT ST (SAFETY) ×2 IMPLANT
ELECT REM PT RETURN 9FT ADLT (ELECTROSURGICAL) ×2
ELECTRODE REM PT RTRN 9FT ADLT (ELECTROSURGICAL) IMPLANT
FIBER LASER FLEXIVA 1000 (UROLOGICAL SUPPLIES) IMPLANT
FIBER LASER FLEXIVA 365 (UROLOGICAL SUPPLIES) IMPLANT
FIBER LASER FLEXIVA 550 (UROLOGICAL SUPPLIES) IMPLANT
FIBER LASER TRAC TIP (UROLOGICAL SUPPLIES) IMPLANT
GLOVE SURG SS PI 8.0 STRL IVOR (GLOVE) ×2 IMPLANT
GOWN STRL REUS W/ TWL LRG LVL3 (GOWN DISPOSABLE) IMPLANT
GOWN STRL REUS W/ TWL XL LVL3 (GOWN DISPOSABLE) ×1 IMPLANT
GOWN STRL REUS W/TWL LRG LVL3 (GOWN DISPOSABLE)
GOWN STRL REUS W/TWL XL LVL3 (GOWN DISPOSABLE) ×2
GUIDEWIRE 0.038 PTFE COATED (WIRE) IMPLANT
GUIDEWIRE ANG ZIPWIRE 038X150 (WIRE) IMPLANT
GUIDEWIRE STR DUAL SENSOR (WIRE) ×2 IMPLANT
IV NS IRRIG 3000ML ARTHROMATIC (IV SOLUTION) ×2 IMPLANT
KIT BALLIN UROMAX 15FX10 (LABEL) IMPLANT
KIT BALLN UROMAX 15FX4 (MISCELLANEOUS) IMPLANT
KIT BALLN UROMAX 26 75X4 (MISCELLANEOUS)
KIT RM TURNOVER CYSTO AR (KITS) ×2 IMPLANT
MANIFOLD NEPTUNE II (INSTRUMENTS) ×1 IMPLANT
PACK CYSTO (CUSTOM PROCEDURE TRAY) ×2 IMPLANT
SET HIGH PRES BAL DIL (LABEL)
SHEATH ACCESS URETERAL 38CM (SHEATH) IMPLANT
TUBE CONNECTING 12X1/4 (SUCTIONS) ×1 IMPLANT

## 2016-12-05 NOTE — Interval H&P Note (Signed)
History and Physical Interval Note:  12/05/2016 12:00 PM  [name] [name]  has presented today for surgery, with the diagnosis of LEFT HYDRONEPHROSIS WITH POSSIBLE DISTAL STONE  The various methods of treatment have been discussed with the patient and family. After consideration of risks, benefits and other options for treatment, the patient has consented to  Procedure(s): CYSTOSCOPY LEFT /RETROGRADE POSSIBLE URETEROSCOPY AND STENT (Left) as a surgical intervention .  The patient's history has been reviewed, patient examined, no change in status, stable for surgery.  I have reviewed the patient's chart and labs.  Questions were answered to the patient's satisfaction.     Taejon Irani J

## 2016-12-05 NOTE — Transfer of Care (Signed)
Last Vitals:  Vitals:   12/05/16 1017 12/05/16 1321  BP: (!) 152/69 134/76  Pulse: 71 79  Resp: 18 10  Temp: 36.9 C 36.4 C    Last Pain:  Vitals:   12/05/16 1017  TempSrc: Oral      Patients Stated Pain Goal: 5 (12/05/16 1038)  Immediate Anesthesia Transfer of Care Note  Patient: [name] [name]  Procedure(s) Performed: Procedure(s) (LRB): CYSTOSCOPY AND FOLEY CATHERTER PLACEMENT (N/A)  Patient Location: PACU  Anesthesia Type: General  Level of Consciousness: awake, alert  and oriented  Airway & Oxygen Therapy: Patient Spontanous Breathing and Patient connected to face mask oxygen  Post-op Assessment: Report given to PACU RN and Post -op Vital signs reviewed and stable  Post vital signs: Reviewed and stable  Complications: No apparent anesthesia complications

## 2016-12-05 NOTE — Brief Op Note (Signed)
12/05/2016  1:08 PM  PATIENT:  [name] [name]  83 y.o. male  PRE-OPERATIVE DIAGNOSIS:  LEFT HYDRONEPHROSIS WITH POSSIBLE DISTAL STONE  POST-OPERATIVE DIAGNOSIS:  Left hydronephrosis from locally recurrent prostate cancer  PROCEDURE:  Procedure(s): CYSTOSCOPY FULGURATION OF PROSTATIC URETHRA  SURGEON:  Surgeon(s) and Role:    * [name], MD - Primary  PHYSICIAN ASSISTANT:   ASSISTANTS: none   ANESTHESIA:   general  EBL:  Total I/O In: 700 [I.V.:700] Out: 5 [Blood:5]  BLOOD ADMINISTERED:none  DRAINS: Urinary Catheter (Foley)   LOCAL MEDICATIONS USED:  NONE  SPECIMEN:  No Specimen  DISPOSITION OF SPECIMEN:  N/A  COUNTS:  YES  TOURNIQUET:  * No tourniquets in log *  DICTATION: .Other Dictation: Dictation Number [id number]  PLAN OF CARE: Discharge to home after PACU  PATIENT DISPOSITION:  PACU - hemodynamically stable.   Delay start of Pharmacological VTE agent (>24hrs) due to surgical blood loss or risk of bleeding: yes

## 2016-12-05 NOTE — Anesthesia Procedure Notes (Signed)
Procedure Name: LMA Insertion Date/Time: 12/05/2016 12:36 PM Performed by: Lyndle Herrlich Pre-anesthesia Checklist: Patient identified, Emergency Drugs available, Suction available and Patient being monitored Patient Re-evaluated:Patient Re-evaluated prior to inductionOxygen Delivery Method: Circle system utilized Preoxygenation: Pre-oxygenation with 100% oxygen Intubation Type: IV induction Ventilation: Mask ventilation without difficulty LMA: LMA inserted LMA Size: 5.0 Number of attempts: 1 Airway Equipment and Method: Bite block Placement Confirmation: positive ETCO2 Tube secured with: Tape Dental Injury: Teeth and Oropharynx as per pre-operative assessment

## 2016-12-05 NOTE — Discharge Instructions (Signed)
° °  Post Anesthesia Home Care Instructions ° °Activity: °Get plenty of rest for the remainder of the day. A responsible adult should stay with you for 24 hours following the procedure.  °For the next 24 hours, DO NOT: °-Drive a car °-Operate machinery °-Drink alcoholic beverages °-Take any medication unless instructed by your physician °-Make any legal decisions or sign important papers. ° °Meals: °Start with liquid foods such as gelatin or soup. Progress to regular foods as tolerated. Avoid greasy, spicy, heavy foods. If nausea and/or vomiting occur, drink only clear liquids until the nausea and/or vomiting subsides. Call your physician if vomiting continues. ° °Special Instructions/Symptoms: °Your throat may feel dry or sore from the anesthesia or the breathing tube placed in your throat during surgery. If this causes discomfort, gargle with warm salt water. The discomfort should disappear within 24 hours. ° °If you had a scopolamine patch placed behind your ear for the management of post- operative nausea and/or vomiting: ° °1. The medication in the patch is effective for 72 hours, after which it should be removed.  Wrap patch in a tissue and discard in the trash. Wash hands thoroughly with soap and water. °2. You may remove the patch earlier than 72 hours if you experience unpleasant side effects which may include dry mouth, dizziness or visual disturbances. °3. Avoid touching the patch. Wash your hands with soap and water after contact with the patch. °  ° ° °CYSTOSCOPY HOME CARE INSTRUCTIONS ° °Activity: °Rest for the remainder of the day.  Do not drive or operate equipment today.  You may resume normal activities in one to two days as instructed by your physician.  ° °Meals: °Drink plenty of liquids and eat light foods such as gelatin or soup this evening.  You may return to a normal meal plan tomorrow. ° °Return to Work: °You may return to work in one to two days or as instructed by your  physician. ° °Special Instructions / Symptoms: °Call your physician if any of these symptoms occur: ° ° -persistent or heavy bleeding ° -bleeding which continues after first few urination ° -large blood clots that are difficult to pass ° -urine stream diminishes or stops completely ° -fever equal to or higher than 101 degrees Farenheit. ° -cloudy urine with a strong, foul odor ° -severe pain ° °Females should always wipe from front to back after elimination.  You may feel some burning pain when you urinate.  This should disappear with time.  Applying moist heat to the lower abdomen or a hot tub bath may help relieve the pain. \ ° ° ° °Patient Signature:  ________________________________________________________ ° °Nurse's Signature:  ________________________________________________________ ° °

## 2016-12-05 NOTE — Anesthesia Preprocedure Evaluation (Signed)
Anesthesia Evaluation  Patient identified by MRN, date of birth, ID band Patient awake    Reviewed: Allergy & Precautions, NPO status , Patient's Chart, lab work & pertinent test results  Airway Mallampati: II  TM Distance: >3 FB Neck ROM: Full    Dental no notable dental hx.    Pulmonary neg pulmonary ROS,    Pulmonary exam normal breath sounds clear to auscultation       Cardiovascular negative cardio ROS Normal cardiovascular exam Rhythm:Regular Rate:Normal     Neuro/Psych negative neurological ROS  negative psych ROS   GI/Hepatic negative GI ROS, Neg liver ROS,   Endo/Other  Hypothyroidism Morbid obesity  Renal/GU Recurrent prostate cancer ([place]) UROLOGIST-  DR WRENN/  ONCOLOGIST-  DR Alen Blew dx 1997--  s/p  radioactive seed implants;  recurrent disease 10/ 2014 s/p  TUR;  biochemical recurrence w/ PSA 15.8 (02/ 2018) and castrate resistant disease to bone;  hormone therapy since 2015   negative genitourinary   Musculoskeletal negative musculoskeletal ROS (+)   Abdominal   Peds negative pediatric ROS (+)  Hematology negative hematology ROS (+)   Anesthesia Other Findings   Reproductive/Obstetrics negative OB ROS                             Anesthesia Physical Anesthesia Plan  ASA: III  Anesthesia Plan: General   Post-op Pain Management:    Induction: Intravenous  Airway Management Planned: LMA  Additional Equipment:   Intra-op Plan:   Post-operative Plan: Extubation in OR  Informed Consent: I have reviewed the patients History and Physical, chart, labs and discussed the procedure including the risks, benefits and alternatives for the proposed anesthesia with the patient or authorized representative who has indicated his/her understanding and acceptance.   Dental advisory given  Plan Discussed with: CRNA and Surgeon  Anesthesia Plan Comments:         Anesthesia  Quick Evaluation

## 2016-12-05 NOTE — Telephone Encounter (Signed)
lvm to inform pt of 3/14 appt at 330 per LOS

## 2016-12-05 NOTE — Anesthesia Postprocedure Evaluation (Signed)
Anesthesia Post Note  Patient: [name] [name]  Procedure(s) Performed: Procedure(s) (LRB): CYSTOSCOPY AND FOLEY CATHERTER PLACEMENT (N/A)  Patient location during evaluation: PACU Anesthesia Type: General Level of consciousness: awake and alert Pain management: pain level controlled Vital Signs Assessment: post-procedure vital signs reviewed and stable Respiratory status: spontaneous breathing, nonlabored ventilation, respiratory function stable and patient connected to nasal cannula oxygen Cardiovascular status: blood pressure returned to baseline and stable Postop Assessment: no signs of nausea or vomiting Anesthetic complications: no       Last Vitals:  Vitals:   12/05/16 1400 12/05/16 1436  BP: 129/75 (!) 173/76  Pulse: 72 77  Resp: 12 12  Temp:  36.7 C    Last Pain:  Vitals:   12/05/16 1436  TempSrc:   PainSc: 0-No pain                 [name]

## 2016-12-06 ENCOUNTER — Encounter (HOSPITAL_BASED_OUTPATIENT_CLINIC_OR_DEPARTMENT_OTHER): Payer: Self-pay | Admitting: Urology

## 2016-12-06 ENCOUNTER — Ambulatory Visit (HOSPITAL_BASED_OUTPATIENT_CLINIC_OR_DEPARTMENT_OTHER): Payer: Medicare HMO | Admitting: Oncology

## 2016-12-06 VITALS — BP 154/62 | HR 90 | Temp 97.6°F | Resp 16 | Wt 233.3 lb

## 2016-12-06 DIAGNOSIS — C7951 Secondary malignant neoplasm of bone: Secondary | ICD-10-CM | POA: Diagnosis not present

## 2016-12-06 DIAGNOSIS — C61 Malignant neoplasm of prostate: Secondary | ICD-10-CM | POA: Diagnosis not present

## 2016-12-06 MED ORDER — ENZALUTAMIDE 40 MG PO CAPS: 80.0000 mg | ORAL_CAPSULE | Freq: Every day | ORAL | 0 refills | Status: DC

## 2016-12-06 NOTE — Progress Notes (Signed)
Hematology and Oncology Follow Up Visit  [name] [name] [id number] May 08, 1932 83 y.o. 12/06/2016 3:44 PM [name] [name] [name] [name], [name] Baltimore, MD   Principle Diagnosis: 83 year old gentleman diagnosed with prostate cancer in 1998 after presenting with a Gleason score of 9. 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   Prior Therapy: He did develop recurrent disease in October 2014 and required a channel TUR. He subsequently was started on firmagon which controlled his PSA reasonably well.    PSA was up to 1.91 in June 2016 after a rise from 0.78 in March 2016. Before that was 0.53 in November 2015.   Xtandi 160 mg daily started in August 2016. Therapy has been on hold since November 2016 due to poor tolerance. He had an excellent response with PSA dropping to 0.1.  Current therapy:  Androgen deprivation. Administered at [facility] urology. Xtandi to be resumed at 80 mg daily starting March 2018.  Interim History: [name] [name] is as today for a follow-up visit. 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 He was found to have left hydronephrosis and tolerated the procedure reasonably well performed by Dr. Jeffie [name] on 12/05/2016. He did have an PET scan on 11/14/2016 which showed nodal metastasis and increased uptake in the left prostate and bladder base.  He denied any hematuria or dysuria. He is ambulating with the help of a cane and have not reported any falls or syncope. He does report instability at times. He denied any bone pain or pathological fractures. He denied any changes in his performance status or activity level.   He does not report any headaches, blurry vision, syncope or seizures. He does not report any fevers, chills, sweats or weight loss. He is not reporting any chest pain, palpitation, orthopnea or leg edema. He does not report any cough or hemoptysis or hematemesis. He does not report any nausea, vomiting,  abdominal pain, hematochezia. He does report incontinence but no hematuria, dysuria, nocturia. He does not report any skeletal complaints. Remainder review of systems unremarkable.   Medications: I have reviewed the patient's current medications.  Current Outpatient Prescriptions  Medication Sig Dispense Refill  . acetaminophen (TYLENOL) 325 MG tablet Take 650 mg by mouth every 6 (six) hours as needed for mild pain.     Marland Kitchen bismuth subsalicylate (PEPTO BISMOL) 262 MG/15ML suspension Take 30 mLs by mouth every 6 (six) hours as needed.    . Cyanocobalamin (VITAMIN B 12 PO) Take 1 tablet by mouth 3 (three) times a week.    . levothyroxine (SYNTHROID, LEVOTHROID) 150 MCG tablet Take 150 mcg by mouth daily before breakfast.    . Multiple Vitamin (MULTIVITAMIN WITH MINERALS) TABS tablet Take 1 tablet by mouth every 7 (seven) days.    Marland Kitchen MYRBETRIQ 50 MG TB24 tablet Take 50 mg by mouth daily as needed.   0  . trimethoprim (TRIMPEX) 100 MG tablet Take 100 mg by mouth at bedtime.    . enzalutamide (XTANDI) 40 MG capsule Take 2 capsules (80 mg total) by mouth daily. 60 capsule 0  . meloxicam (MOBIC) 15 MG tablet Take 15 mg by mouth daily.     No current facility-administered medications for this visit.      Allergies: No Known Allergies  Past Medical History, Surgical history, Social history, and Family History were reviewed and updated.   Physical Exam: Blood pressure (!) 154/62, pulse 90, temperature 97.6 F (36.4 C), temperature source Oral, resp. rate 16, weight  233 lb 4.8 oz (105.8 kg), SpO2 97 %. ECOG: 1 General appearance: Alert, awake gentleman appeared without distress. Head: Normocephalic, without obvious abnormality no oral ulcers or thrush. Neck: no adenopathy or thyroid abnormalities. Lymph nodes: Cervical, supraclavicular, and axillary nodes normal. Heart:regular rate and rhythm, S1, S2 normal, no murmur, click, rub or gallop Lung:chest clear, no wheezing, rales, normal symmetric air  entry.  Abdomin: soft, non-tender, without masses or organomegaly no rebound or guarding. EXT:no erythema, induration, or nodules   Lab Results: Lab Results  Component Value Date   WBC 5.2 11/03/2016   HGB 11.7 (L) 12/05/2016   HCT 35.9 (L) 11/03/2016   MCV 95.0 11/03/2016   PLT 162 11/03/2016     Chemistry      Component Value Date/Time   NA 141 11/03/2016 1407   K 4.7 11/03/2016 1407   CL 106 05/18/2016 0626   CO2 25 11/03/2016 1407   BUN 26.2 (H) 11/03/2016 1407   CREATININE 1.1 11/03/2016 1407      Component Value Date/Time   CALCIUM 9.2 11/03/2016 1407   ALKPHOS 73 11/03/2016 1407   AST 18 11/03/2016 1407   ALT 18 11/03/2016 1407   BILITOT 0.35 11/03/2016 1407     Results for [name] [name], [name] [name] (MRN [id number]) as of 12/06/2016 15:49  Ref. Range 04/28/2016 14:40 07/28/2016 15:04 11/03/2016 14:07  PSA Latest Ref Range: 0.0 - 4.0 ng/mL 3.4 7.6 (H) 16.2 (H)     Impression and Plan:   83 year old gentleman with the following issues:  1. Prostate cancer diagnosed in 1998 after presenting with a Gleason score 9 and received definitive therapy with seed implants. He developed recurrent disease in 2014 and have been on hormone therapy since 2015. He had a reasonable response initially and his PSA was up to 1.91 in June 2016 from 0.78 in March 2016. He is quite asymptomatic.   CT scan and bone scan obtained on 04/08/2015  showed stable disease with increased uptake at T8 vertebra and lumbar spine which is suggestive of metastatic disease or possible arthritis. CT scan of the abdomen and pelvis did not show any visceral metastasis.  After brief treatment with Gillermina Phy, his PSA declined to 0.12 but tolerated the medication poorly. He is currently not receiving any treatment other than hormone therapy.   His bone scan obtained on 04/19/2016 did not show any clear-cut metastatic disease.   His PSA has been rising rather rapidly with possible local relapse with pelvic adenopathy and  prosthetic cancer invasion into the bladder. Risks and benefits of restarting Gillermina Phy was discussed today. He is agreeable to start at a lower dose of 80 mg and titrate up if he tolerates it. Different salvage therapy can be used such as Zytiga or chemotherapy if he has poor tolerance to this medication.  2. Hormone therapy: I have recommended continuing that for the time being. This will need to be continued indefinitely.  3. Follow-up: Will be in the next 8 weeks to follow on his progress and his tolerance to this medication.   [facility], MD 3/14/20183:44 PM

## 2016-12-06 NOTE — Op Note (Signed)
NAME:  RANDALL, COLDEN NO.:  MEDICAL RECORD NO.:  [id number]  LOCATION:                                 FACILITY:  PHYSICIAN:  Marshall Cork. Jeffie Pollock, M.D.         DATE OF BIRTH:  DATE OF PROCEDURE:  12/05/2016 DATE OF DISCHARGE:                              OPERATIVE REPORT   SURGEON:  Marshall Cork. Jeffie Pollock, MD.  PROCEDURE:  Cystoscopy with fulguration of prostatic urethra.  PREOPERATIVE DIAGNOSES:  Left hydronephrosis with possible left distal ureteral stone.  POSTOPERATIVE DIAGNOSES:  Left hydronephrosis from locally recurrent prostate cancer.  ANESTHESIA:  General.  SPECIMEN:  None.  DRAINS:  A 20-French Foley catheter.  BLOOD LOSS:  Minimal.  COMPLICATIONS:  None.  INDICATION:  [name] [name] is an 83 year old white male with a history of castrate resistant prostate cancer and stones.  He was recently found on restaging studies to have recurrent left hydronephrosis with a possible stone in the left distal ureter.  It was felt that cystoscopy with attempted left retrograde pyelogram and ureteroscopy with stone removal was indicated.  There was also concern that this could be from recurrent prostate cancer.  FINDINGS OF PROCEDURE:  He was taken to the operating room, where general anesthetic was induced.  He was given 2 g of Rocephin.  He was placed in lithotomy position, was fitted with PAS hose.  His perineum and genitalia were prepped with Betadine solution, and he was draped in usual sterile fashion.  A cystoscopy was performed using a 23-French scope and 30 and 70-degree lenses.  Examination revealed a normal urethra.  The external sphincter was somewhat patulous.  The prostatic urethra had some necrotic- appearing tissue, particularly on the left side that is most consistent with recurrent prostate cancer.  This tissue bled easily on passage of the scope.  Inspection of the bladder demonstrated moderate trabeculation.  No intravesical mucosal  lesions were identified.  No stones were seen, but I was unable to identify the ureteral orifices on either side despite thorough inspection with both the 30 and 70-degree lenses and attempted on the left to probe the area with a wire.  During passage of the cystoscope and movement of the scope, an attempt to find the ureteral orifice, the friable necrotic recurrent cancerous tissue in the bladder neck and mid prostatic urethral area began to bleed, so the irrigation was changed from saline to water and a Bugbee electrode was used to attempt to control the bleeding.  This was partially successful, but because of continued need to move the scope about every movement, stirred up a little more bleeding.  After a thorough effort, abandoned the attempt to find the left ureteral orifice, and the scope was removed.  A 20-French Foley catheter was inserted.  The balloon was filled with 30 mL of sterile fluid.  The catheter was held on traction and irrigated until there was clear return.  The catheter was then placed to straight drainage.  The patient was taken down from lithotomy position.  His anesthetic was reversed.  He was moved to the recovery room in stable condition.  I will discuss further options with  him, but at this point, we will need to either consider percutaneous nephrolithotomy or resuming second-line therapy for his castrate resistant prostate cancer and see if that will result in resolution of the hydronephrosis.     Marshall Cork. Jeffie Pollock, M.D.   ______________________________ Marshall Cork. Jeffie Pollock, M.D.    JJW/MEDQ  D:  12/05/2016  T:  12/05/2016  Job:  [id number]  cc:   Wyatt Portela, M.D. Fax: [phone]

## 2016-12-12 DIAGNOSIS — N1330 Unspecified hydronephrosis: Secondary | ICD-10-CM | POA: Diagnosis not present

## 2016-12-12 DIAGNOSIS — N390 Urinary tract infection, site not specified: Secondary | ICD-10-CM | POA: Diagnosis not present

## 2017-01-02 ENCOUNTER — Other Ambulatory Visit: Payer: Self-pay | Admitting: *Deleted

## 2017-01-02 MED ORDER — ENZALUTAMIDE 40 MG PO CAPS: 80.0000 mg | ORAL_CAPSULE | Freq: Every day | ORAL | 0 refills | Status: DC

## 2017-01-05 ENCOUNTER — Telehealth: Payer: Self-pay | Admitting: Pharmacist

## 2017-01-05 NOTE — Telephone Encounter (Signed)
Oral Chemotherapy Pharmacist Encounter  Received notification from [facility] that new [facility] prescription (restart) would require prior authorization. [facility] can perform PA, but will need the last MD office note to provide clinical documentation.  Dr. Hazeline Junker office note from 12/06/16 faxed to [facility] at [facility] will continue to follow.  Johny Drilling, PharmD, BCPS, BCOP 01/05/2017  4:09 PM Oral Oncology Clinic [phone]

## 2017-01-09 ENCOUNTER — Telehealth: Payer: Self-pay | Admitting: *Deleted

## 2017-01-09 MED ORDER — ENZALUTAMIDE 40 MG PO CAPS
80.0000 mg | ORAL_CAPSULE | Freq: Every day | ORAL | 0 refills | Status: DC
Start: 2017-01-09 — End: 2017-02-06

## 2017-01-09 NOTE — Telephone Encounter (Signed)
Patient calling for a refill on xtandi. E-scribed to [facility] in [place].

## 2017-01-15 DIAGNOSIS — N1330 Unspecified hydronephrosis: Secondary | ICD-10-CM | POA: Diagnosis not present

## 2017-01-15 DIAGNOSIS — C61 Malignant neoplasm of prostate: Secondary | ICD-10-CM | POA: Diagnosis not present

## 2017-01-15 DIAGNOSIS — N390 Urinary tract infection, site not specified: Secondary | ICD-10-CM | POA: Diagnosis not present

## 2017-01-16 NOTE — Telephone Encounter (Signed)
Oral Chemotherapy Pharmacist Encounter  Received notification from [place] prescription insurance provider that prior authorization of patient's Gillermina Phy has been approved Effective dates: 12/24/16-01/08/2020  No further needs from [facility] identified at this time. Oral Oncology Clinic will sign off. Please let us know if we can be of assistance in the future.  Johny Drilling, PharmD, BCPS, BCOP 01/16/2017  2:15 PM Oral Oncology Clinic [phone]

## 2017-01-23 DIAGNOSIS — N1330 Unspecified hydronephrosis: Secondary | ICD-10-CM | POA: Diagnosis not present

## 2017-01-25 DIAGNOSIS — Z1389 Encounter for screening for other disorder: Secondary | ICD-10-CM | POA: Diagnosis not present

## 2017-01-25 DIAGNOSIS — M25511 Pain in right shoulder: Secondary | ICD-10-CM | POA: Diagnosis not present

## 2017-01-25 DIAGNOSIS — C61 Malignant neoplasm of prostate: Secondary | ICD-10-CM | POA: Diagnosis not present

## 2017-01-25 DIAGNOSIS — E039 Hypothyroidism, unspecified: Secondary | ICD-10-CM | POA: Diagnosis not present

## 2017-01-25 DIAGNOSIS — D692 Other nonthrombocytopenic purpura: Secondary | ICD-10-CM | POA: Diagnosis not present

## 2017-01-25 DIAGNOSIS — N393 Stress incontinence (female) (male): Secondary | ICD-10-CM | POA: Diagnosis not present

## 2017-01-25 DIAGNOSIS — Z0000 Encounter for general adult medical examination without abnormal findings: Secondary | ICD-10-CM | POA: Diagnosis not present

## 2017-01-29 DIAGNOSIS — N1330 Unspecified hydronephrosis: Secondary | ICD-10-CM | POA: Diagnosis not present

## 2017-01-29 DIAGNOSIS — N393 Stress incontinence (female) (male): Secondary | ICD-10-CM | POA: Diagnosis not present

## 2017-01-29 DIAGNOSIS — C7919 Secondary malignant neoplasm of other urinary organs: Secondary | ICD-10-CM | POA: Diagnosis not present

## 2017-01-29 DIAGNOSIS — C61 Malignant neoplasm of prostate: Secondary | ICD-10-CM | POA: Diagnosis not present

## 2017-02-02 ENCOUNTER — Ambulatory Visit (HOSPITAL_BASED_OUTPATIENT_CLINIC_OR_DEPARTMENT_OTHER): Payer: Medicare HMO | Admitting: Oncology

## 2017-02-02 ENCOUNTER — Telehealth: Payer: Self-pay | Admitting: Oncology

## 2017-02-02 ENCOUNTER — Other Ambulatory Visit (HOSPITAL_BASED_OUTPATIENT_CLINIC_OR_DEPARTMENT_OTHER): Payer: Medicare HMO

## 2017-02-02 VITALS — BP 130/60 | HR 76 | Temp 99.0°F | Resp 18 | Ht 67.0 in | Wt 233.5 lb

## 2017-02-02 DIAGNOSIS — C7951 Secondary malignant neoplasm of bone: Secondary | ICD-10-CM | POA: Diagnosis not present

## 2017-02-02 DIAGNOSIS — E291 Testicular hypofunction: Secondary | ICD-10-CM | POA: Diagnosis not present

## 2017-02-02 DIAGNOSIS — N138 Other obstructive and reflux uropathy: Secondary | ICD-10-CM | POA: Diagnosis not present

## 2017-02-02 DIAGNOSIS — C61 Malignant neoplasm of prostate: Secondary | ICD-10-CM | POA: Diagnosis not present

## 2017-02-02 LAB — COMPREHENSIVE METABOLIC PANEL
ALT: 18 U/L (ref 0–55)
AST: 17 U/L (ref 5–34)
Albumin: 4.2 g/dL (ref 3.5–5.0)
Alkaline Phosphatase: 59 U/L (ref 40–150)
Anion Gap: 10 mEq/L (ref 3–11)
BUN: 27.3 mg/dL — ABNORMAL HIGH (ref 7.0–26.0)
CHLORIDE: 106 meq/L (ref 98–109)
CO2: 24 meq/L (ref 22–29)
Calcium: 9.7 mg/dL (ref 8.4–10.4)
Creatinine: 1.2 mg/dL (ref 0.7–1.3)
EGFR: 56 mL/min/{1.73_m2} — ABNORMAL LOW (ref >90)
Glucose: 95 mg/dl (ref 70–140)
Potassium: 4.5 mEq/L (ref 3.5–5.1)
Sodium: 139 mEq/L (ref 136–145)
TOTAL PROTEIN: 7 g/dL (ref 6.4–8.3)
Total Bilirubin: 0.47 mg/dL (ref 0.20–1.20)

## 2017-02-02 LAB — CBC WITH DIFFERENTIAL/PLATELET
BASO%: 0.2 % (ref 0.0–2.0)
Basophils Absolute: 0 10*3/uL (ref 0.0–0.1)
EOS%: 4.5 % (ref 0.0–7.0)
Eosinophils Absolute: 0.2 10*3/uL (ref 0.0–0.5)
HCT: 38.2 % — ABNORMAL LOW (ref 38.4–49.9)
HGB: 12.6 g/dL — ABNORMAL LOW (ref 13.0–17.1)
LYMPH#: 1.6 10*3/uL (ref 0.9–3.3)
LYMPH%: 29.9 % (ref 14.0–49.0)
MCH: 31.7 pg (ref 27.2–33.4)
MCHC: 33 g/dL (ref 32.0–36.0)
MCV: 96 fL (ref 79.3–98.0)
MONO#: 0.4 10*3/uL (ref 0.1–0.9)
MONO%: 7.1 % (ref 0.0–14.0)
NEUT%: 58.3 % (ref 39.0–75.0)
NEUTROS ABS: 3.1 10*3/uL (ref 1.5–6.5)
Platelets: 165 10*3/uL (ref 140–400)
RBC: 3.98 10*6/uL — AB (ref 4.20–5.82)
RDW: 13.5 % (ref 11.0–14.6)
WBC: 5.4 10*3/uL (ref 4.0–10.3)

## 2017-02-02 NOTE — Telephone Encounter (Signed)
Gave patient AVS and calender per 5/11 los. Lab and f/u in 2 months.

## 2017-02-02 NOTE — Progress Notes (Signed)
Hematology and Oncology Follow Up Visit  [name] [name] [id number] [birth year] 83 y.o. 02/02/2017 2:48 PM [name] [name], [name] Baltimore, MD   Principle Diagnosis: 83 year old gentleman diagnosed with prostate cancer in 1998 after presenting with a Gleason score of 9. 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   Prior Therapy: He did develop recurrent disease in October 2014 and required a channel TUR. He subsequently was started on firmagon which controlled his PSA reasonably well.    PSA was up to 1.91 in June 2016 after a rise from 0.78 in March 2016. Before that was 0.53 in November 2015.   Xtandi 160 mg daily started in August 2016. Therapy has been on hold since November 2016 due to poor tolerance. He had an excellent response with PSA dropping to 0.1.  Current therapy:  Androgen deprivation. Administered at [facility] urology. Xtandi to be resumed at 80 mg daily started March 2018.  Interim History: Mr. [name] [name] is as today for a follow-up visit. Since the last visit, he started Xtandi 80 mg daily and tolerated it well. He denied any complications related to this medication. He denied any nausea, fatigue or excessive tiredness. He denied any falls or syncope. He does have some mild fatigue but is manageable.  He denied any hematuria or dysuria. He is ambulating with the help of a cane. He denied any bone pain or pathological fractures. He denied any changes in his performance status or activity level. He continues to have reasonable quality of life.   He does not report any headaches, blurry vision, syncope or seizures. He does not report any fevers, chills, sweats or weight loss. He is not reporting any chest pain, palpitation, orthopnea or leg edema. He does not report any cough or hemoptysis or hematemesis. He does not report any nausea, vomiting, abdominal pain, hematochezia. He does report incontinence but no hematuria, dysuria, nocturia. He does not report any skeletal  complaints. Remainder review of systems unremarkable.   Medications: I have reviewed the patient's current medications.  Current Outpatient Prescriptions  Medication Sig Dispense Refill  . acetaminophen (TYLENOL) 325 MG tablet Take 650 mg by mouth every 6 (six) hours as needed for mild pain.     Marland Kitchen bismuth subsalicylate (PEPTO BISMOL) 262 MG/15ML suspension Take 30 mLs by mouth every 6 (six) hours as needed.    . Cyanocobalamin (VITAMIN B 12 PO) Take 1 tablet by mouth 3 (three) times a week.    . enzalutamide (XTANDI) 40 MG capsule Take 2 capsules (80 mg total) by mouth daily. 60 capsule 0  . levothyroxine (SYNTHROID, LEVOTHROID) 150 MCG tablet Take 150 mcg by mouth daily before breakfast.    . meloxicam (MOBIC) 15 MG tablet Take 15 mg by mouth daily.    . Multiple Vitamin (MULTIVITAMIN WITH MINERALS) TABS tablet Take 1 tablet by mouth every 7 (seven) days.    Marland Kitchen MYRBETRIQ 50 MG TB24 tablet Take 50 mg by mouth daily as needed.   0  . trimethoprim (TRIMPEX) 100 MG tablet Take 100 mg by mouth at bedtime.     No current facility-administered medications for this visit.      Allergies: No Known Allergies  Past Medical History, Surgical history, Social history, and Family History were reviewed and updated.   Physical Exam: Blood pressure 130/60, pulse 76, temperature 99 F (37.2 C), temperature source Oral, resp. rate 18, height 5\' 7"  (1.702 m), weight 233 lb 8 oz (105.9 kg), SpO2 96 %. ECOG: 1 General appearance: Well-appearing  gentleman appeared comfortable. Head: Normocephalic, without obvious abnormality no oral thrush or ulcers. Neck: no adenopathy or thyroid abnormalities. Lymph nodes: Cervical, supraclavicular, and axillary nodes normal. Heart:regular rate and rhythm, S1, S2 normal, no murmur, click, rub or gallop Lung:chest clear, no wheezing, rales, normal symmetric air entry.  Abdomin: soft, non-tender, without masses or organomegaly no rebound or guarding. EXT:no erythema,  induration, or nodules   Lab Results: Lab Results  Component Value Date   WBC 5.4 02/02/2017   HGB 12.6 (L) 02/02/2017   HCT 38.2 (L) 02/02/2017   MCV 96.0 02/02/2017   PLT 165 02/02/2017     Chemistry      Component Value Date/Time   NA 141 11/03/2016 1407   K 4.7 11/03/2016 1407   CL 106 05/18/2016 0626   CO2 25 11/03/2016 1407   BUN 26.2 (H) 11/03/2016 1407   CREATININE 1.1 11/03/2016 1407      Component Value Date/Time   CALCIUM 9.2 11/03/2016 1407   ALKPHOS 73 11/03/2016 1407   AST 18 11/03/2016 1407   ALT 18 11/03/2016 1407   BILITOT 0.35 11/03/2016 1407         Impression and Plan:   83 year old gentleman with the following issues:  1. Prostate cancer diagnosed in 1998 after presenting with a Gleason score 9 and received definitive therapy with seed implants. He developed recurrent disease in 2014 and have been on hormone therapy since 2015. He had a reasonable response initially and his PSA was up to 1.91 in June 2016 from 0.78 in March 2016. He is quite asymptomatic.   After brief treatment with Gillermina Phy, his PSA declined to 0.12 but tolerated the medication poorly.    Gillermina Phy was resumed in March 2018 after developed progression of disease. He is currently at 80 mg daily and have tolerated it well. His PSA is currently pending after a recent increase to 16.2. The blasts continue the same dose and schedule for the time being.  2. Hormone therapy: I have recommended continuing that for the time being. This will need to be continued indefinitely.  3. Follow-up: Will be in the next 8 weeks to follow on his progress and his tolerance to this medication.   Zola Button, MD 5/11/20182:48 PM

## 2017-02-03 LAB — PSA: PROSTATE SPECIFIC AG, SERUM: 0.5 ng/mL (ref 0.0–4.0)

## 2017-02-05 ENCOUNTER — Other Ambulatory Visit: Payer: Self-pay | Admitting: *Deleted

## 2017-02-05 ENCOUNTER — Telehealth: Payer: Self-pay | Admitting: *Deleted

## 2017-02-05 ENCOUNTER — Encounter: Payer: Self-pay | Admitting: *Deleted

## 2017-02-05 NOTE — Telephone Encounter (Signed)
Spoke with patient, gave results of last PSA 

## 2017-02-05 NOTE — Telephone Encounter (Signed)
-----   Message from Wyatt Portela, MD sent at 02/05/2017  8:53 AM EDT ----- Please let him know his PSA is down.

## 2017-02-06 ENCOUNTER — Other Ambulatory Visit: Payer: Self-pay | Admitting: *Deleted

## 2017-02-06 MED ORDER — ENZALUTAMIDE 40 MG PO CAPS: 80.0000 mg | ORAL_CAPSULE | Freq: Every day | ORAL | 0 refills | Status: DC

## 2017-02-14 DIAGNOSIS — E860 Dehydration: Secondary | ICD-10-CM | POA: Diagnosis not present

## 2017-03-08 ENCOUNTER — Other Ambulatory Visit: Payer: Self-pay | Admitting: *Deleted

## 2017-03-08 MED ORDER — ENZALUTAMIDE 40 MG PO CAPS: 80.0000 mg | ORAL_CAPSULE | Freq: Every day | ORAL | 0 refills | Status: DC

## 2017-04-05 ENCOUNTER — Other Ambulatory Visit: Payer: Self-pay | Admitting: *Deleted

## 2017-04-05 MED ORDER — ENZALUTAMIDE 40 MG PO CAPS: 80.0000 mg | ORAL_CAPSULE | Freq: Every day | ORAL | 0 refills | Status: DC

## 2017-04-06 NOTE — Addendum Note (Signed)
Addendum  created 04/06/17 1450 by Lyndle Herrlich, MD   Sign clinical note

## 2017-04-06 NOTE — Anesthesia Postprocedure Evaluation (Signed)
Anesthesia Post Note  Patient: [name] [name]  Procedure(s) Performed: Procedure(s) (LRB): CYSTOSCOPY AND FOLEY CATHERTER PLACEMENT (N/A)     Anesthesia Post Evaluation  Last Vitals:  Vitals:   12/05/16 1400 12/05/16 1436  BP: 129/75 (!) 173/76  Pulse: 72 77  Resp: 12 12  Temp:  36.7 C    Last Pain:  Vitals:   12/06/16 1357  TempSrc:   PainSc: 0-No pain                 Viaan Knippenberg EDWARD

## 2017-04-13 ENCOUNTER — Ambulatory Visit (HOSPITAL_BASED_OUTPATIENT_CLINIC_OR_DEPARTMENT_OTHER): Payer: Medicare HMO | Admitting: Oncology

## 2017-04-13 ENCOUNTER — Telehealth: Payer: Self-pay | Admitting: Oncology

## 2017-04-13 ENCOUNTER — Other Ambulatory Visit: Payer: Medicare HMO

## 2017-04-13 VITALS — BP 150/65 | HR 78 | Temp 99.4°F | Resp 18 | Ht 67.0 in | Wt 213.4 lb

## 2017-04-13 DIAGNOSIS — C61 Malignant neoplasm of prostate: Secondary | ICD-10-CM

## 2017-04-13 DIAGNOSIS — C7951 Secondary malignant neoplasm of bone: Secondary | ICD-10-CM

## 2017-04-13 NOTE — Telephone Encounter (Signed)
Scheduled appt per 7/20 los - Gave patient AVS and calender per los.  

## 2017-04-13 NOTE — Progress Notes (Signed)
Hematology and Oncology Follow Up Visit  MARTYN TIMME [id number] 02/06/1932 83 y.o. 04/13/2017 2:36 PM [name], [name], MD   Principle Diagnosis: 83 year old gentleman diagnosed with prostate cancer in 1998 after presenting with a Gleason score of 9. 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   Prior Therapy: He did develop recurrent disease in October 2014 and required a channel TUR. He subsequently was started on firmagon which controlled his PSA reasonably well.    PSA was up to 1.91 in June 2016 after a rise from 0.78 in March 2016. Before that was 0.53 in November 2015.   Xtandi 160 mg daily started in August 2016. Therapy has been on hold since November 2016 due to poor tolerance. He had an excellent response with PSA dropping to 0.1.  Current therapy:  Androgen deprivation. Administered at [facility] urology. Xtandi to be resumed at 80 mg daily started March 2018. The dose was reduced to 40 mg daily started in July 2018.  Interim History: Mr. Fluharty is as today for a follow-up visit. Since the last visit, he reports feeling reasonably fair. He continues to take Xtandi 80 mg daily and has reported more fatigue. The fatigue has been less manageable and started to interfere with his life. He still able to drive and attends to activities of daily living. He denied any nausea,falls or syncope.   He denied any hematuria or dysuria. He denied any bone pain or pathological fractures. He denied any changes in his performance status or activity level. He continues to have reasonable quality of life but slightly declined as of late.   He does not report any headaches, blurry vision, syncope or seizures. He does not report any fevers, chills, sweats or weight loss. He is not reporting any chest pain, palpitation, orthopnea or leg edema. He does not report any cough or hemoptysis or hematemesis. He does not report any nausea, vomiting, abdominal pain, hematochezia. He  does report incontinence but no hematuria, dysuria, nocturia. He does not report any skeletal complaints. Remainder review of systems unremarkable.   Medications: I have reviewed the patient's current medications.  Current Outpatient Prescriptions  Medication Sig Dispense Refill  . acetaminophen (TYLENOL) 325 MG tablet Take 650 mg by mouth every 6 (six) hours as needed for mild pain.     Marland Kitchen bismuth subsalicylate (PEPTO BISMOL) 262 MG/15ML suspension Take 30 mLs by mouth every 6 (six) hours as needed.    . Cyanocobalamin (VITAMIN B 12 PO) Take 1 tablet by mouth 3 (three) times a week.    . enzalutamide (XTANDI) 40 MG capsule Take 2 capsules (80 mg total) by mouth daily. 60 capsule 0  . levothyroxine (SYNTHROID, LEVOTHROID) 150 MCG tablet Take 150 mcg by mouth daily before breakfast.    . meloxicam (MOBIC) 15 MG tablet Take 15 mg by mouth daily.    . Multiple Vitamin (MULTIVITAMIN WITH MINERALS) TABS tablet Take 1 tablet by mouth every 7 (seven) days.    Marland Kitchen MYRBETRIQ 50 MG TB24 tablet Take 50 mg by mouth daily as needed.   0  . SHINGRIX injection TO BE ADMINISTERED BY PHARMACIST FOR IMMUNIZATION  0  . trimethoprim (TRIMPEX) 100 MG tablet Take 100 mg by mouth at bedtime.     No current facility-administered medications for this visit.      Allergies: No Known Allergies  Past Medical History, Surgical history, Social history, and Family History were reviewed and updated.   Physical Exam: Blood pressure (!) 150/65, pulse  78, temperature 99.4 F (37.4 C), temperature source Oral, resp. rate 18, height 5\' 7"  (1.702 m), weight 213 lb 6.4 oz (96.8 kg), SpO2 98 %. ECOG: 1 General appearance: Alert, awake gentleman without distress. Head: Normocephalic, without obvious abnormality no oral ulcers or lesions. Neck: no adenopathy or thyroid abnormalities. Lymph nodes: Cervical, supraclavicular, and axillary nodes normal. Heart:regular rate and rhythm, S1, S2 normal, no murmur, click, rub or  gallop Lung:chest clear, no wheezing, rales, normal symmetric air entry.  Abdomin: soft, non-tender, without masses or organomegaly no shifting dullness or ascites. EXT:no erythema, induration, or nodules   Lab Results: Lab Results  Component Value Date   WBC 5.4 02/02/2017   HGB 12.6 (L) 02/02/2017   HCT 38.2 (L) 02/02/2017   MCV 96.0 02/02/2017   PLT 165 02/02/2017     Chemistry      Component Value Date/Time   NA 139 02/02/2017 1426   K 4.5 02/02/2017 1426   CL 106 05/18/2016 0626   CO2 24 02/02/2017 1426   BUN 27.3 (H) 02/02/2017 1426   CREATININE 1.2 02/02/2017 1426      Component Value Date/Time   CALCIUM 9.7 02/02/2017 1426   ALKPHOS 59 02/02/2017 1426   AST 17 02/02/2017 1426   ALT 18 02/02/2017 1426   BILITOT 0.47 02/02/2017 1426      Results for LINLEY, MOXLEY (MRN [id number]) as of 04/13/2017 14:29  Ref. Range 07/28/2016 15:04 11/03/2016 14:07 02/02/2017 14:26  Prostate Specific Ag, Serum Latest Ref Range: 0.0 - 4.0 ng/mL 7.6 (H) 16.2 (H) 0.5     Impression and Plan:   83 year old gentleman with the following issues:  1. Prostate cancer diagnosed in 1998 after presenting with a Gleason score 9 and received definitive therapy with seed implants. He developed recurrent disease in 2014 and have been on hormone therapy since 2015. He had a reasonable response initially and his PSA was up to 1.91 in June 2016 from 0.78 in March 2016. He is quite asymptomatic.   After brief treatment with Gillermina Phy, his PSA declined to 0.12 but tolerated the medication poorly.    Gillermina Phy was resumed in March 2018 after developed progression of disease.   He is currently at 80 mg daily with excellent PSA response down to 0.5. He expressed interest in reducing the dose further because of excessive fatigue. The plan is to resume this medication at 40 mg daily for better tolerance.  2. Hormone therapy: I have recommended continuing that for the time being. This will need to be continued  indefinitely.  3. Follow-up: Will be in the next 8 weeks to follow on his progress and his tolerance to this current dose.   Zola Button, MD 7/20/20182:36 PM

## 2017-05-02 DIAGNOSIS — C61 Malignant neoplasm of prostate: Secondary | ICD-10-CM | POA: Diagnosis not present

## 2017-05-02 DIAGNOSIS — N1330 Unspecified hydronephrosis: Secondary | ICD-10-CM | POA: Diagnosis not present

## 2017-05-04 ENCOUNTER — Other Ambulatory Visit: Payer: Self-pay | Admitting: *Deleted

## 2017-05-04 MED ORDER — ENZALUTAMIDE 40 MG PO CAPS: 80.0000 mg | ORAL_CAPSULE | Freq: Every day | ORAL | 0 refills | Status: DC

## 2017-05-09 DIAGNOSIS — Z192 Hormone resistant malignancy status: Secondary | ICD-10-CM | POA: Diagnosis not present

## 2017-05-09 DIAGNOSIS — N393 Stress incontinence (female) (male): Secondary | ICD-10-CM | POA: Diagnosis not present

## 2017-05-09 DIAGNOSIS — C61 Malignant neoplasm of prostate: Secondary | ICD-10-CM | POA: Diagnosis not present

## 2017-05-09 DIAGNOSIS — N200 Calculus of kidney: Secondary | ICD-10-CM | POA: Diagnosis not present

## 2017-05-10 ENCOUNTER — Encounter: Payer: Self-pay | Admitting: *Deleted

## 2017-06-05 ENCOUNTER — Telehealth: Payer: Self-pay | Admitting: Oncology

## 2017-06-05 ENCOUNTER — Ambulatory Visit (HOSPITAL_BASED_OUTPATIENT_CLINIC_OR_DEPARTMENT_OTHER): Payer: Medicare HMO | Admitting: Oncology

## 2017-06-05 ENCOUNTER — Other Ambulatory Visit (HOSPITAL_BASED_OUTPATIENT_CLINIC_OR_DEPARTMENT_OTHER): Payer: Medicare HMO

## 2017-06-05 VITALS — BP 135/65 | HR 75 | Temp 98.8°F | Resp 18 | Ht 67.0 in | Wt 235.0 lb

## 2017-06-05 DIAGNOSIS — E291 Testicular hypofunction: Secondary | ICD-10-CM

## 2017-06-05 DIAGNOSIS — C61 Malignant neoplasm of prostate: Secondary | ICD-10-CM

## 2017-06-05 LAB — CBC WITH DIFFERENTIAL/PLATELET
BASO%: 0.5 % (ref 0.0–2.0)
Basophils Absolute: 0 10*3/uL (ref 0.0–0.1)
EOS ABS: 0.2 10*3/uL (ref 0.0–0.5)
EOS%: 3.1 % (ref 0.0–7.0)
HCT: 37.7 % — ABNORMAL LOW (ref 38.4–49.9)
HEMOGLOBIN: 12.7 g/dL — AB (ref 13.0–17.1)
LYMPH%: 25.6 % (ref 14.0–49.0)
MCH: 32.8 pg (ref 27.2–33.4)
MCHC: 33.7 g/dL (ref 32.0–36.0)
MCV: 97.1 fL (ref 79.3–98.0)
MONO#: 0.4 10*3/uL (ref 0.1–0.9)
MONO%: 7.5 % (ref 0.0–14.0)
NEUT%: 63.3 % (ref 39.0–75.0)
NEUTROS ABS: 3.6 10*3/uL (ref 1.5–6.5)
PLATELETS: 167 10*3/uL (ref 140–400)
RBC: 3.89 10*6/uL — ABNORMAL LOW (ref 4.20–5.82)
RDW: 14.3 % (ref 11.0–14.6)
WBC: 5.6 10*3/uL (ref 4.0–10.3)
lymph#: 1.4 10*3/uL (ref 0.9–3.3)

## 2017-06-05 LAB — COMPREHENSIVE METABOLIC PANEL
ALT: 15 U/L (ref 0–55)
AST: 17 U/L (ref 5–34)
Albumin: 4.1 g/dL (ref 3.5–5.0)
Alkaline Phosphatase: 66 U/L (ref 40–150)
Anion Gap: 9 mEq/L (ref 3–11)
BILIRUBIN TOTAL: 0.41 mg/dL (ref 0.20–1.20)
BUN: 22.7 mg/dL (ref 7.0–26.0)
CO2: 24 meq/L (ref 22–29)
CREATININE: 1.2 mg/dL (ref 0.7–1.3)
Calcium: 9.4 mg/dL (ref 8.4–10.4)
Chloride: 106 mEq/L (ref 98–109)
EGFR: 57 mL/min/{1.73_m2} — AB (ref >90)
GLUCOSE: 109 mg/dL (ref 70–140)
Potassium: 4.6 mEq/L (ref 3.5–5.1)
SODIUM: 140 meq/L (ref 136–145)
TOTAL PROTEIN: 6.9 g/dL (ref 6.4–8.3)

## 2017-06-05 NOTE — Telephone Encounter (Signed)
Gave avs and calendar for january °

## 2017-06-05 NOTE — Progress Notes (Signed)
Hematology and Oncology Follow Up Visit  [name] [name] [id number] 1932/06/24 83 y.o. 06/05/2017 3:06 PM [name] [name], [name] [place], MD   Principle Diagnosis: 83 year old gentleman diagnosed with prostate cancer in 1998 after presenting with a Gleason score of 9. 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   Prior Therapy: He did develop recurrent disease in October 2014 and required a channel TUR. He subsequently was started on firmagon which controlled his PSA reasonably well.    PSA was up to 1.91 in June 2016 after a rise from 0.78 in March 2016. Before that was 0.53 in November 2015.   Xtandi 160 mg daily started in August 2016. Therapy has been on hold since November 2016 due to poor tolerance. He had an excellent response with PSA dropping to 0.1.  Current therapy:  Androgen deprivation. Administered at [facility] urology. Xtandi to be resumed at 80 mg daily started March 2018. The dose was reduced to 40 mg daily started in July 2018.  Interim History: [name] is as today for a follow-up visit. Since the last visit, he reports no changes in his health. He continues to take Xtandi 40 mg daily without major complications. He does report some mild fatigue but has been manageable at this time. He still able to drive and attends to activities of daily living. He denied any nausea,falls or syncope. He denied any nausea, vomiting or change in his bowel habits. He denied any hematuria or dysuria. He denied any bone pain or pathological fractures. He continues to exercise periodically and ambulates with the help of a cane.   He does not report any headaches, blurry vision, syncope or seizures. He does not report any fevers, chills, sweats or weight loss. He is not reporting any chest pain, palpitation, orthopnea or leg edema. He does not report any cough or hemoptysis or hematemesis. He does not report any nausea, vomiting, abdominal pain, hematochezia. He does report incontinence  but no hematuria, dysuria, nocturia. He does not report any skeletal complaints. Remainder review of systems unremarkable.   Medications: I have reviewed the patient's current medications.  Current Outpatient Prescriptions  Medication Sig Dispense Refill  . acetaminophen (TYLENOL) 325 MG tablet Take 650 mg by mouth every 6 (six) hours as needed for mild pain.     Marland Kitchen bismuth subsalicylate (PEPTO BISMOL) 262 MG/15ML suspension Take 30 mLs by mouth every 6 (six) hours as needed.    . Cyanocobalamin (VITAMIN B 12 PO) Take 1 tablet by mouth 3 (three) times a week.    . enzalutamide (XTANDI) 40 MG capsule Take 2 capsules (80 mg total) by mouth daily. 60 capsule 0  . levothyroxine (SYNTHROID, LEVOTHROID) 150 MCG tablet Take 150 mcg by mouth daily before breakfast.    . meloxicam (MOBIC) 15 MG tablet Take 15 mg by mouth daily.    . Multiple Vitamin (MULTIVITAMIN WITH MINERALS) TABS tablet Take 1 tablet by mouth every 7 (seven) days.    Marland Kitchen MYRBETRIQ 50 MG TB24 tablet Take 50 mg by mouth daily as needed.   0  . SHINGRIX injection TO BE ADMINISTERED BY PHARMACIST FOR IMMUNIZATION  0  . trimethoprim (TRIMPEX) 100 MG tablet Take 100 mg by mouth at bedtime.     No current facility-administered medications for this visit.      Allergies: No Known Allergies  Past Medical History, Surgical history, Social history, and Family History were reviewed and updated.   Physical Exam: Blood pressure 135/65, pulse 75, temperature 98.8 F (  37.1 C), temperature source Oral, resp. rate 18, height 5\' 7"  (1.702 m), weight 235 lb (106.6 kg), SpO2 98 %. ECOG: 1 General appearance: Alert, awake gentleman without distress. Head: Normocephalic, without obvious abnormality no oral ulcers or lesions. Neck: no adenopathy or thyroid abnormalities. Lymph nodes: Cervical, supraclavicular, and axillary nodes normal. Heart:regular rate and rhythm, S1, S2 normal, no murmur, click, rub or gallop Lung:chest clear, no wheezing,  rales, normal symmetric air entry.  Abdomin: soft, non-tender, without masses or organomegaly no shifting dullness or ascites. EXT:no erythema, induration, or nodules   Lab Results: Lab Results  Component Value Date   WBC 5.6 06/05/2017   HGB 12.7 (L) 06/05/2017   HCT 37.7 (L) 06/05/2017   MCV 97.1 06/05/2017   PLT 167 06/05/2017     Chemistry      Component Value Date/Time   NA 139 02/02/2017 1426   K 4.5 02/02/2017 1426   CL 106 05/18/2016 0626   CO2 24 02/02/2017 1426   BUN 27.3 (H) 02/02/2017 1426   CREATININE 1.2 02/02/2017 1426      Component Value Date/Time   CALCIUM 9.7 02/02/2017 1426   ALKPHOS 59 02/02/2017 1426   AST 17 02/02/2017 1426   ALT 18 02/02/2017 1426   BILITOT 0.47 02/02/2017 1426      Results for [name] [name], [name] [name] (MRN [id number]) as of 06/05/2017 15:00  Ref. Range 07/28/2016 15:04 11/03/2016 14:07 02/02/2017 14:26  Prostate Specific Ag, Serum Latest Ref Range: 0.0 - 4.0 ng/mL 7.6 (H) 16.2 (H) 0.5      Impression and Plan:   83 year old gentleman with the following issues:  1. Prostate cancer diagnosed in 1998 after presenting with a Gleason score 9 and received definitive therapy with seed implants. He developed recurrent disease in 2014 and have been on hormone therapy since 2015. He had a reasonable response initially and his PSA was up to 1.91 in June 2016 from 0.78 in March 2016. He is quite asymptomatic.   After brief treatment with Gillermina Phy, his PSA declined to 0.12 but tolerated the medication poorly.    Gillermina Phy was resumed in March 2018 after developed progression of disease.   He is Has taking 80 mg daily with excellent PSA response down to 0.5. He is currently at 40 mg daily was continued reasonable tolerance.  Risks and benefits of continuing this medication were reviewed today and is agreeable to continue.  2. Hormone therapy: I have recommended continuing that for the time being. This will need to be continued indefinitely.  3.  Follow-up: Will be in 4 months to follow-up his progress.  Zola Button, MD 9/11/20183:06 PM

## 2017-06-06 ENCOUNTER — Telehealth: Payer: Self-pay | Admitting: *Deleted

## 2017-06-06 LAB — PSA

## 2017-06-06 NOTE — Telephone Encounter (Signed)
-----   Message from Wyatt Portela, MD sent at 06/06/2017  9:04 AM EDT ----- Please let him know his PSA

## 2017-06-06 NOTE — Telephone Encounter (Signed)
As noted below by Dr. Alen Blew, I left a message for patient with his PSA result. Instructed him to call [facility] if he had any questions.

## 2017-06-18 ENCOUNTER — Encounter: Payer: Self-pay | Admitting: *Deleted

## 2017-06-18 ENCOUNTER — Other Ambulatory Visit: Payer: Self-pay | Admitting: *Deleted

## 2017-06-18 MED ORDER — ENZALUTAMIDE 40 MG PO CAPS: 80.0000 mg | ORAL_CAPSULE | Freq: Every day | ORAL | 0 refills | Status: DC

## 2017-06-25 DIAGNOSIS — Z23 Encounter for immunization: Secondary | ICD-10-CM | POA: Diagnosis not present

## 2017-07-13 DIAGNOSIS — H524 Presbyopia: Secondary | ICD-10-CM | POA: Diagnosis not present

## 2017-07-13 DIAGNOSIS — H5203 Hypermetropia, bilateral: Secondary | ICD-10-CM | POA: Diagnosis not present

## 2017-07-13 DIAGNOSIS — H52223 Regular astigmatism, bilateral: Secondary | ICD-10-CM | POA: Diagnosis not present

## 2017-07-31 ENCOUNTER — Telehealth: Payer: Self-pay

## 2017-07-31 NOTE — Telephone Encounter (Signed)
Pt called for his xtandi refill. He has 9 days left.

## 2017-08-03 ENCOUNTER — Other Ambulatory Visit: Payer: Self-pay | Admitting: *Deleted

## 2017-08-03 MED ORDER — ENZALUTAMIDE 40 MG PO CAPS: 80.0000 mg | ORAL_CAPSULE | Freq: Every day | ORAL | 0 refills | Status: DC

## 2017-08-08 DIAGNOSIS — C61 Malignant neoplasm of prostate: Secondary | ICD-10-CM | POA: Diagnosis not present

## 2017-08-10 ENCOUNTER — Other Ambulatory Visit: Payer: Self-pay | Admitting: *Deleted

## 2017-08-10 MED ORDER — ENZALUTAMIDE 40 MG PO CAPS
80.0000 mg | ORAL_CAPSULE | Freq: Every day | ORAL | 0 refills | Status: DC
Start: 2017-08-10 — End: 2017-08-31

## 2017-08-15 DIAGNOSIS — N393 Stress incontinence (female) (male): Secondary | ICD-10-CM | POA: Diagnosis not present

## 2017-08-15 DIAGNOSIS — C61 Malignant neoplasm of prostate: Secondary | ICD-10-CM | POA: Diagnosis not present

## 2017-08-31 ENCOUNTER — Other Ambulatory Visit: Payer: Self-pay | Admitting: *Deleted

## 2017-08-31 ENCOUNTER — Encounter: Payer: Self-pay | Admitting: *Deleted

## 2017-08-31 MED ORDER — ENZALUTAMIDE 40 MG PO CAPS: 80.0000 mg | ORAL_CAPSULE | Freq: Every day | ORAL | 0 refills | Status: DC

## 2017-10-02 ENCOUNTER — Inpatient Hospital Stay: Payer: Medicare HMO

## 2017-10-02 ENCOUNTER — Telehealth: Payer: Self-pay | Admitting: Oncology

## 2017-10-02 ENCOUNTER — Inpatient Hospital Stay: Payer: Medicare HMO | Attending: Oncology | Admitting: Oncology

## 2017-10-02 VITALS — BP 135/66 | HR 85 | Temp 99.5°F | Resp 18 | Ht 67.0 in | Wt 232.9 lb

## 2017-10-02 DIAGNOSIS — Z79899 Other long term (current) drug therapy: Secondary | ICD-10-CM | POA: Diagnosis not present

## 2017-10-02 DIAGNOSIS — N1330 Unspecified hydronephrosis: Secondary | ICD-10-CM

## 2017-10-02 DIAGNOSIS — C61 Malignant neoplasm of prostate: Secondary | ICD-10-CM | POA: Insufficient documentation

## 2017-10-02 DIAGNOSIS — I10 Essential (primary) hypertension: Secondary | ICD-10-CM | POA: Insufficient documentation

## 2017-10-02 DIAGNOSIS — C7951 Secondary malignant neoplasm of bone: Secondary | ICD-10-CM | POA: Diagnosis not present

## 2017-10-02 LAB — CBC WITH DIFFERENTIAL/PLATELET
Abs Granulocyte: 2.8 10*3/uL (ref 1.5–6.5)
BASOS ABS: 0 10*3/uL (ref 0.0–0.1)
Basophils Relative: 1 %
EOS PCT: 3 %
Eosinophils Absolute: 0.2 10*3/uL (ref 0.0–0.5)
HEMATOCRIT: 37.9 % — AB (ref 38.4–49.9)
HEMOGLOBIN: 12.6 g/dL — AB (ref 13.0–17.1)
Lymphocytes Relative: 28 %
Lymphs Abs: 1.3 10*3/uL (ref 0.9–3.3)
MCH: 31.9 pg (ref 27.2–33.4)
MCHC: 33.3 g/dL (ref 32.0–36.0)
MCV: 96 fL (ref 79.3–98.0)
Monocytes Absolute: 0.4 10*3/uL (ref 0.1–0.9)
Monocytes Relative: 8 %
NEUTROS PCT: 60 %
Neutro Abs: 2.8 10*3/uL (ref 1.5–6.5)
PLATELETS: 191 10*3/uL (ref 140–400)
RBC: 3.95 MIL/uL — ABNORMAL LOW (ref 4.20–5.82)
RDW: 14.1 % (ref 11.0–15.6)
WBC: 4.7 10*3/uL (ref 4.0–10.3)

## 2017-10-02 LAB — COMPREHENSIVE METABOLIC PANEL
ALT: 19 U/L (ref 0–55)
AST: 19 U/L (ref 5–34)
Albumin: 4.3 g/dL (ref 3.5–5.0)
Alkaline Phosphatase: 67 U/L (ref 40–150)
Anion gap: 8 (ref 3–11)
BUN: 24 mg/dL (ref 7–26)
CO2: 25 mmol/L (ref 22–29)
CREATININE: 1.41 mg/dL — AB (ref 0.70–1.30)
Calcium: 9.7 mg/dL (ref 8.4–10.4)
Chloride: 104 mmol/L (ref 98–109)
GFR calc non Af Amer: 44 mL/min — ABNORMAL LOW (ref >60)
GFR, EST AFRICAN AMERICAN: 52 mL/min — AB (ref >60)
Glucose, Bld: 112 mg/dL (ref 70–140)
Potassium: 4.4 mmol/L (ref 3.5–5.1)
SODIUM: 137 mmol/L (ref 136–145)
Total Bilirubin: 0.6 mg/dL (ref 0.2–1.2)
Total Protein: 7 g/dL (ref 6.4–8.3)

## 2017-10-02 NOTE — Telephone Encounter (Signed)
Gave avs and calendar for may  °

## 2017-10-02 NOTE — Progress Notes (Addendum)
Hematology and Oncology Follow Up Visit  [name] [name] [id number] 29-Nov-1931 84 y.o. 10/02/2017 3:22 PM Lenis [name], [name] [place], MD   Principle Diagnosis: 84 year old gentleman with prostate cancer in 1998 after presenting with a Gleason score of 9. 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   Prior Therapy: He was started on firmagon in October 2014 and required a channel TUR.  His PSA started to rise up to 1.91 in June 2016 after a rise from 0.78 in March 2016.PSA was 0.53 in November 2015.   Xtandi 160 mg daily started in August 2016.  Therapy interrupted until March 2018 because of poor tolerance.  Current therapy:  Androgen deprivation. Administered at [facility] urology.  He is receiving Lupron every 3 months. Xtandi 40 mg daily started in July 2018.  He has been taking 80 mg starting in March 2018 and the dose was reduced for better tolerance.  Interim History: [name] [name] is is here by himself for a follow-up visit. He continues to take Xtandi 40 mg daily without without any major changes.  He does report some mild fatigue but has not changed dramatically and still able to drive. He still able to drive and attends to activities of daily living. He denied any nausea or vomiting.  He denies any falls or syncope.  He denied any hematuria or dysuria. He denied any bone pain or pathological fractures.   He continues to receive Lupron every 3 months under the care of Dr. Jeffie Pollock without any major complications.  He denies any hot flashes or major weight gain.  He reports his urine output is adequate without any pelvic discomfort.     He does not report any headaches, blurry vision, syncope or seizures. He does not report any fevers, chills, sweats or weight loss. He is not reporting any chest pain, palpitation, orthopnea or leg edema. He does not report any cough or hemoptysis or hematemesis. He does not report any nausea, vomiting, abdominal pain,  hematochezia. He does report incontinence but no hematuria, dysuria, nocturia. He does not report any skeletal complaints.  He does not report any lymphadenopathy, petechiae or skin rash.  He does not report any mood problems, anxiety or depression remainder review of systems is negative.  Medications: I have reviewed the patient's current medications.  Current Outpatient Medications  Medication Sig Dispense Refill  . acetaminophen (TYLENOL) 325 MG tablet Take 650 mg by mouth every 6 (six) hours as needed for mild pain.     Marland Kitchen bismuth subsalicylate (PEPTO BISMOL) 262 MG/15ML suspension Take 30 mLs by mouth every 6 (six) hours as needed.    . Cyanocobalamin (VITAMIN B 12 PO) Take 1 tablet by mouth 3 (three) times a week.    . enzalutamide (XTANDI) 40 MG capsule Take 2 capsules (80 mg total) by mouth daily. 60 capsule 0  . levothyroxine (SYNTHROID, LEVOTHROID) 150 MCG tablet Take 150 mcg by mouth daily before breakfast.    . meloxicam (MOBIC) 15 MG tablet Take 15 mg by mouth daily.    . Multiple Vitamin (MULTIVITAMIN WITH MINERALS) TABS tablet Take 1 tablet by mouth every 7 (seven) days.    Marland Kitchen MYRBETRIQ 50 MG TB24 tablet Take 50 mg by mouth daily as needed.   0  . SHINGRIX injection TO BE ADMINISTERED BY PHARMACIST FOR IMMUNIZATION  0  . trimethoprim (TRIMPEX) 100 MG tablet Take 100 mg by mouth at bedtime.     No current facility-administered medications for this visit.  Allergies: No Known Allergies  Past Medical History, Surgical history, Social history, and Family History updated without any changes since last visit.  He denies any smoking or alcohol use.   Physical Exam: Blood pressure 135/66, pulse 85, temperature 99.5 F (37.5 C), temperature source Oral, resp. rate 18, height 5\' 7"  (1.702 m), weight 232 lb 14.4 oz (105.6 kg), SpO2 96 %. ECOG: 1 General appearance: Well-appearing gentleman appeared without distress. Head: Normocephalic atraumatic. Oropharynx: No oral ulcers or  lesions.  No thrush noted. Eyes: No scleral icterus. Lymph nodes: No lymphadenopathy noted in the cervical, axillary or inguinal areas. Heart: No murmurs, rubs or gallops.  Regular rate and rhythm noted. Lung: Clear in all lung fields.  No wheezing, rales, normal symmetric air entry.  Abdomin: No rebound or guarding.  Soft, nontender with good bowel sounds. EXT:no erythema, induration, or nodules  Skin: No rashes lesions or petechiae. Skeletal: No joint tenderness, swelling or effusion.  Full range of motion noted of his joints. Psych: Behavior and mood is appropriate.   Lab Results: Lab Results  Component Value Date   WBC 4.7 10/02/2017   HGB 12.6 (L) 10/02/2017   HCT 37.9 (L) 10/02/2017   MCV 96.0 10/02/2017   PLT 191 10/02/2017     Chemistry      Component Value Date/Time   NA 140 06/05/2017 1443   K 4.6 06/05/2017 1443   CL 106 05/18/2016 0626   CO2 24 06/05/2017 1443   BUN 22.7 06/05/2017 1443   CREATININE 1.2 06/05/2017 1443      Component Value Date/Time   CALCIUM 9.4 06/05/2017 1443   ALKPHOS 66 06/05/2017 1443   AST 17 06/05/2017 1443   ALT 15 06/05/2017 1443   BILITOT 0.41 06/05/2017 1443      Results for [name], [name]" (MRN [id number]) as of 10/02/2017 15:10  Ref. Range 02/02/2017 14:26 06/05/2017 14:43  Prostate Specific Ag, Serum Latest Ref Range: 0.0 - 4.0 ng/mL 0.5 <0.1       Impression and Plan:   85 year old gentleman with the following issues:  1. Prostate cancer diagnosed in 1998.  He had Gleason score 9 and received definitive therapy with seed implants.   He developed recurrent disease in 2014 and has been receiving androgen deprivation therapy since 2015.  He had a reasonable response initially and his PSA was up to 1.91 in June 2016 from 0.78 in March 2016.   Xtandi started in 2016 with an excellent response.  His PSA declined to 0.12 but tolerated the medication poorly.    Gillermina Phy was resumed in March 2018 after developed  progression of disease.  Disease progressed in the pelvis with bilateral hydronephrosis.  He is Has taking 80 mg daily with excellent PSA response down to 0.5. He is currently at 40 mg daily for better tolerance.  He continues to tolerate the 40 mg dosing without any new complications.  Risks and benefits of continuing this medication was reviewed again and he is agreeable to continue.  Different salvage therapies were reviewed today which include systemic chemotherapy, Zytiga and Xofigo if he develops progressive disease in the future.     2. Hormone therapy: He continues to take Lupron at every 3 months without complications.  I emphasized the importance of continuing this therapy indefinitely.  3.  Hydronephrosis: Related to his prostate cancer recurrence.  This has improved since the start of Xtandi and his kidney function in September 2011 was within normal range.  4.  Hypertension: His  blood pressure is within normal range at this time.  We have discussed the potential increase in blood pressure associated with Xtandi and the need for antihypertensive medication if needed to in the future.  5. Follow-up: Will be in 4 months to follow-up his progress.  25 minutes spent face-to-face with the patient today with greater than 50% spent counseling and coordination the care of the patient.  This includes the natural course of his prostate cancer as well as the treatment modalities he is receiving and potentially can receive in the future.  Zola Button, MD 1/8/20193:22 PM

## 2017-10-04 ENCOUNTER — Telehealth: Payer: Self-pay | Admitting: *Deleted

## 2017-10-04 LAB — PROSTATE-SPECIFIC AG, SERUM (LABCORP): Prostate Specific Ag, Serum: <0.1 ng/mL (ref 0.0–4.0)

## 2017-10-04 NOTE — Telephone Encounter (Signed)
-----   Message from Wyatt Portela, MD sent at 10/04/2017 10:24 AM EST ----- Please let him know his PSA is still low.

## 2017-10-04 NOTE — Telephone Encounter (Signed)
Tried calling patient to give him PSA results. The home phone number is not set up to receive messages. Cell phone was unable to leave a message.

## 2017-10-17 DIAGNOSIS — Z8249 Family history of ischemic heart disease and other diseases of the circulatory system: Secondary | ICD-10-CM | POA: Diagnosis not present

## 2017-10-17 DIAGNOSIS — E039 Hypothyroidism, unspecified: Secondary | ICD-10-CM | POA: Diagnosis not present

## 2017-10-17 DIAGNOSIS — G8929 Other chronic pain: Secondary | ICD-10-CM | POA: Diagnosis not present

## 2017-10-17 DIAGNOSIS — E669 Obesity, unspecified: Secondary | ICD-10-CM | POA: Diagnosis not present

## 2017-10-17 DIAGNOSIS — C61 Malignant neoplasm of prostate: Secondary | ICD-10-CM | POA: Diagnosis not present

## 2017-10-17 DIAGNOSIS — R32 Unspecified urinary incontinence: Secondary | ICD-10-CM | POA: Diagnosis not present

## 2017-10-17 DIAGNOSIS — Z791 Long term (current) use of non-steroidal anti-inflammatories (NSAID): Secondary | ICD-10-CM | POA: Diagnosis not present

## 2017-10-17 DIAGNOSIS — J309 Allergic rhinitis, unspecified: Secondary | ICD-10-CM | POA: Diagnosis not present

## 2017-10-17 DIAGNOSIS — N529 Male erectile dysfunction, unspecified: Secondary | ICD-10-CM | POA: Diagnosis not present

## 2017-10-17 DIAGNOSIS — N189 Chronic kidney disease, unspecified: Secondary | ICD-10-CM | POA: Diagnosis not present

## 2017-10-30 ENCOUNTER — Other Ambulatory Visit: Payer: Self-pay | Admitting: *Deleted

## 2017-10-30 DIAGNOSIS — C61 Malignant neoplasm of prostate: Secondary | ICD-10-CM

## 2017-10-30 MED ORDER — ENZALUTAMIDE 40 MG PO CAPS: 80.0000 mg | ORAL_CAPSULE | Freq: Every day | ORAL | 0 refills | Status: DC

## 2017-11-14 DIAGNOSIS — C61 Malignant neoplasm of prostate: Secondary | ICD-10-CM | POA: Diagnosis not present

## 2017-11-21 DIAGNOSIS — Z87442 Personal history of urinary calculi: Secondary | ICD-10-CM | POA: Diagnosis not present

## 2017-11-21 DIAGNOSIS — N393 Stress incontinence (female) (male): Secondary | ICD-10-CM | POA: Diagnosis not present

## 2017-11-21 DIAGNOSIS — C61 Malignant neoplasm of prostate: Secondary | ICD-10-CM | POA: Diagnosis not present

## 2017-12-25 ENCOUNTER — Other Ambulatory Visit: Payer: Self-pay | Admitting: *Deleted

## 2017-12-25 DIAGNOSIS — C61 Malignant neoplasm of prostate: Secondary | ICD-10-CM

## 2017-12-25 MED ORDER — ENZALUTAMIDE 40 MG PO CAPS: 80.0000 mg | ORAL_CAPSULE | Freq: Every day | ORAL | 0 refills | Status: DC

## 2018-01-30 ENCOUNTER — Inpatient Hospital Stay: Payer: Medicare HMO | Attending: Oncology | Admitting: Oncology

## 2018-01-30 ENCOUNTER — Inpatient Hospital Stay: Payer: Medicare HMO

## 2018-01-30 ENCOUNTER — Telehealth: Payer: Self-pay | Admitting: Oncology

## 2018-01-30 ENCOUNTER — Other Ambulatory Visit: Payer: Self-pay | Admitting: *Deleted

## 2018-01-30 VITALS — BP 101/64 | HR 79 | Temp 99.1°F | Resp 17 | Ht 67.0 in | Wt 236.2 lb

## 2018-01-30 DIAGNOSIS — C61 Malignant neoplasm of prostate: Secondary | ICD-10-CM

## 2018-01-30 DIAGNOSIS — M25511 Pain in right shoulder: Secondary | ICD-10-CM | POA: Diagnosis not present

## 2018-01-30 DIAGNOSIS — Z0000 Encounter for general adult medical examination without abnormal findings: Secondary | ICD-10-CM | POA: Diagnosis not present

## 2018-01-30 DIAGNOSIS — Z79899 Other long term (current) drug therapy: Secondary | ICD-10-CM | POA: Diagnosis not present

## 2018-01-30 DIAGNOSIS — L309 Dermatitis, unspecified: Secondary | ICD-10-CM | POA: Diagnosis not present

## 2018-01-30 DIAGNOSIS — N1330 Unspecified hydronephrosis: Secondary | ICD-10-CM

## 2018-01-30 DIAGNOSIS — E039 Hypothyroidism, unspecified: Secondary | ICD-10-CM | POA: Diagnosis not present

## 2018-01-30 DIAGNOSIS — Z6836 Body mass index (BMI) 36.0-36.9, adult: Secondary | ICD-10-CM | POA: Diagnosis not present

## 2018-01-30 DIAGNOSIS — I10 Essential (primary) hypertension: Secondary | ICD-10-CM

## 2018-01-30 DIAGNOSIS — D692 Other nonthrombocytopenic purpura: Secondary | ICD-10-CM | POA: Diagnosis not present

## 2018-01-30 DIAGNOSIS — N393 Stress incontinence (female) (male): Secondary | ICD-10-CM | POA: Diagnosis not present

## 2018-01-30 DIAGNOSIS — M65342 Trigger finger, left ring finger: Secondary | ICD-10-CM | POA: Diagnosis not present

## 2018-01-30 MED ORDER — ENZALUTAMIDE 40 MG PO CAPS: 80.0000 mg | ORAL_CAPSULE | Freq: Every day | ORAL | 0 refills | Status: DC

## 2018-01-30 NOTE — Telephone Encounter (Signed)
Scheduled appt per 5/8 los - Gave pt avs and calender per los.

## 2018-01-30 NOTE — Progress Notes (Signed)
Hematology and Oncology Follow Up Visit  [name] [name] [id number] 07-Apr-1932 84 y.o. 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  He was initially diagnosed in 1998 with a Gleason score of 9.    Prior Therapy: He was started on firmagon in October 2014.   His PSA started to rise up to 1.91 in June 2016 after a rise from 0.78 in March 2016.PSA was 0.53 in November 2015.   Xtandi 160 mg daily started in August 2016.  Therapy interrupted until March 2018 because of poor tolerance.  Current therapy:   He is receiving Lupron every 3 months.  Xtandi 40 mg daily started in July 2018.    Interim History: [name] presents today for a follow-up visit.  He reports no major changes in his health since last visit.  He did develop a facial rash that has been prescribed topical creams by his primary care physician.  He continues to take Xtandi without any complications related to this medication.  He denies any nausea, excessive fatigue or tiredness.  His appetite remain excellent and his performance status is stable.  His mobility is limited utilizing a cane but does not report any falls or syncope.   He does not report any headaches, blurry vision, syncope or seizures. He does not report any fevers, chills, sweats. He is not reporting any chest pain, palpitation, orthopnea or leg edema. He does not report any cough or hemoptysis or hematemesis. He does not report any nausea, vomiting, abdominal pain, hematochezia.  He denies any frequency urgency or hesitancy. He does not report any skeletal complaints.  He does not report any lymphadenopathy, petechiae or skin rash. Remainder review of systems is negative.  Medications: I have reviewed the patient's current medications.  Current Outpatient Medications  Medication Sig Dispense Refill  . acetaminophen (TYLENOL) 325 MG  tablet Take 650 mg by mouth every 6 (six) hours as needed for mild pain.     Marland Kitchen bismuth subsalicylate (PEPTO BISMOL) 262 MG/15ML suspension Take 30 mLs by mouth every 6 (six) hours as needed.    . Cyanocobalamin (VITAMIN B 12 PO) Take 1 tablet by mouth 3 (three) times a week.    . enzalutamide (XTANDI) 40 MG capsule Take 2 capsules (80 mg total) by mouth daily. 60 capsule 0  . levothyroxine (SYNTHROID, LEVOTHROID) 150 MCG tablet Take 150 mcg by mouth daily before breakfast.    . meloxicam (MOBIC) 15 MG tablet Take 15 mg by mouth daily.    . Multiple Vitamin (MULTIVITAMIN WITH MINERALS) TABS tablet Take 1 tablet by mouth every 7 (seven) days.    Marland Kitchen MYRBETRIQ 50 MG TB24 tablet Take 50 mg by mouth daily as needed.   0  . SHINGRIX injection TO BE ADMINISTERED BY PHARMACIST FOR IMMUNIZATION  0  . trimethoprim (TRIMPEX) 100 MG tablet Take 100 mg by mouth at bedtime.     No current facility-administered medications for this visit.      Allergies: No Known Allergies  Past Medical History, Surgical history, Social history, and Family History updated without any changes since last visit.  He denies any smoking or alcohol use.   Physical Exam: Blood pressure 101/64, pulse 79, temperature 99.1 F (37.3 C), temperature source Oral, resp. rate 17, height 5\' 7"  (1.702 m), weight 236 lb 3.2 oz (107.1 kg), SpO2 95 %.   ECOG: 1 General appearance: Alert, awake gentleman without  distress. Head: Atraumatic without abnormalities. Oropharynx: No oral thrush or ulcers. Eyes: Pupils are equal and round reactive to light. Lymph nodes: cervical, axillary or inguinal lymph nodes are normal. Heart: Regular rate and rhythm without any murmurs or gallops. Lung: Clear to auscultation without any rhonchi, wheezes or dullness to percussion. Abdomin: Soft, nontender without any rebound or guarding. Skin: No ecchymosis or petechiae. Skeletal: No clubbing or cyanosis. Psych: Appropriate mood and affect.   Lab  Results: Lab Results  Component Value Date   WBC 4.7 10/02/2017   HGB 12.6 (L) 10/02/2017   HCT 37.9 (L) 10/02/2017   MCV 96.0 10/02/2017   PLT 191 10/02/2017     Chemistry      Component Value Date/Time   NA 137 10/02/2017 1435   NA 140 06/05/2017 1443   K 4.4 10/02/2017 1435   K 4.6 06/05/2017 1443   CL 104 10/02/2017 1435   CO2 25 10/02/2017 1435   CO2 24 06/05/2017 1443   BUN 24 10/02/2017 1435   BUN 22.7 06/05/2017 1443   CREATININE 1.41 (H) 10/02/2017 1435   CREATININE 1.2 06/05/2017 1443      Component Value Date/Time   CALCIUM 9.7 10/02/2017 1435   CALCIUM 9.4 06/05/2017 1443   ALKPHOS 67 10/02/2017 1435   ALKPHOS 66 06/05/2017 1443   AST 19 10/02/2017 1435   AST 17 06/05/2017 1443   ALT 19 10/02/2017 1435   ALT 15 06/05/2017 1443   BILITOT 0.6 10/02/2017 1435   BILITOT 0.41 06/05/2017 1443       Results for BODIN, [name] "[name]" (MRN [id number]) as of 01/30/2018 14:47  Ref. Range 06/05/2017 14:43 10/02/2017 14:35  Prostate Specific Ag, Serum Latest Ref Range: 0.0 - 4.0 ng/mL <0.1 <0.1      Impression and Plan:   84 year old gentleman with the following issues:  1.  Castration-resistant prostate cancer with his initial diagnosed in 1998.  He developed castration resistant disease in August 2016.  He is currently on Xtandi that has been restarted in 2018 at 40 mg daily.  Risks and benefits of continuing this medication was reviewed today and long-term complications were discussed.  His PSA continues to show under excellent control and currently undetectable.  After discussion today is agreeable to continue.    2.  Androgen deprivation: He continues to take Lupron at every 3 months.  Risks and benefits of continuing Lupron indefinitely was reviewed and is agreeable to continue.  3.  Hydronephrosis: Kidney function remains relatively stable.  This is related to his prostate cancer and continues to follow with Dr. Jeffie Pollock regarding this issue.  4.   Hypertension: Blood pressure continues to be within normal range despite being on Xtandi.  5. Follow-up: Will be in 4 months to follow-up his progress.  15 minutes spent face-to-face with the patient today with greater than 50% spent counseling, education and coordinating his future plan of care.   Zola Button, MD 5/8/20192:47 PM

## 2018-02-07 DIAGNOSIS — Z4789 Encounter for other orthopedic aftercare: Secondary | ICD-10-CM | POA: Diagnosis not present

## 2018-02-07 DIAGNOSIS — M24549 Contracture, unspecified hand: Secondary | ICD-10-CM | POA: Diagnosis not present

## 2018-02-07 DIAGNOSIS — M79642 Pain in left hand: Secondary | ICD-10-CM | POA: Diagnosis not present

## 2018-02-07 DIAGNOSIS — M65342 Trigger finger, left ring finger: Secondary | ICD-10-CM | POA: Diagnosis not present

## 2018-02-07 DIAGNOSIS — M79645 Pain in left finger(s): Secondary | ICD-10-CM | POA: Diagnosis not present

## 2018-02-13 DIAGNOSIS — C61 Malignant neoplasm of prostate: Secondary | ICD-10-CM | POA: Diagnosis not present

## 2018-02-14 ENCOUNTER — Other Ambulatory Visit: Payer: Self-pay | Admitting: *Deleted

## 2018-02-14 ENCOUNTER — Other Ambulatory Visit: Payer: Self-pay | Admitting: Oncology

## 2018-02-14 DIAGNOSIS — C61 Malignant neoplasm of prostate: Secondary | ICD-10-CM

## 2018-02-15 ENCOUNTER — Telehealth: Payer: Self-pay | Admitting: *Deleted

## 2018-02-15 ENCOUNTER — Other Ambulatory Visit: Payer: Self-pay | Admitting: *Deleted

## 2018-02-15 DIAGNOSIS — C61 Malignant neoplasm of prostate: Secondary | ICD-10-CM

## 2018-02-15 MED ORDER — ENZALUTAMIDE 40 MG PO CAPS: 80.0000 mg | ORAL_CAPSULE | Freq: Every day | ORAL | 1 refills | Status: DC

## 2018-02-15 MED ORDER — ENZALUTAMIDE 40 MG PO CAPS: 40.0000 mg | ORAL_CAPSULE | Freq: Every day | ORAL | 0 refills | Status: DC

## 2018-02-15 NOTE — Telephone Encounter (Signed)
"  JHasmine with [facility] calling to request Xtandi directions or quantity clarification of order received for Xtandi two pills daily for quantity of thirty pills."

## 2018-02-20 ENCOUNTER — Telehealth: Payer: Self-pay | Admitting: *Deleted

## 2018-02-20 ENCOUNTER — Other Ambulatory Visit: Payer: Self-pay | Admitting: *Deleted

## 2018-02-20 DIAGNOSIS — C61 Malignant neoplasm of prostate: Secondary | ICD-10-CM

## 2018-02-20 DIAGNOSIS — R9721 Rising PSA following treatment for malignant neoplasm of prostate: Secondary | ICD-10-CM | POA: Diagnosis not present

## 2018-02-20 DIAGNOSIS — Z5111 Encounter for antineoplastic chemotherapy: Secondary | ICD-10-CM | POA: Diagnosis not present

## 2018-02-20 DIAGNOSIS — N393 Stress incontinence (female) (male): Secondary | ICD-10-CM | POA: Diagnosis not present

## 2018-02-20 DIAGNOSIS — C7919 Secondary malignant neoplasm of other urinary organs: Secondary | ICD-10-CM | POA: Diagnosis not present

## 2018-02-20 MED ORDER — ENZALUTAMIDE 40 MG PO CAPS: 80.0000 mg | ORAL_CAPSULE | Freq: Every day | ORAL | 0 refills | Status: DC

## 2018-02-20 NOTE — Telephone Encounter (Signed)
Returned [employer] phone call from [employer]. Per Dr. Alen Blew, give him one more month of Xtandi (40 mg capsule) 80 mg by mouth daily. Quantity 60 with zero refills. Faxed new prescription to [phone]. Deanna's direct number is [phone] extension [phone]. She verbalized understanding.

## 2018-02-28 ENCOUNTER — Telehealth: Payer: Self-pay | Admitting: Pharmacy Technician

## 2018-02-28 DIAGNOSIS — M79642 Pain in left hand: Secondary | ICD-10-CM | POA: Diagnosis not present

## 2018-02-28 DIAGNOSIS — M65342 Trigger finger, left ring finger: Secondary | ICD-10-CM | POA: Diagnosis not present

## 2018-02-28 NOTE — Telephone Encounter (Signed)
Oral Oncology Patient Advocate Encounter  Spoke with patient on the phone and worked to complete an application for [employer] in an effort to reduce the patient's out of pocket expense for Xtandi to $0.    He has been receiving his medication from [employer], but his copayment assistance has been exhausted.   Application completed and faxed to [phone].   [place] phone number for follow up is [phone].   This encounter will be updated until final determination.  Fabio Asa. Melynda Keller, [place] Patient [place] [phone] 02/28/2018 11:40 AM

## 2018-03-01 NOTE — Telephone Encounter (Signed)
Oral Oncology Patient Advocate Encounter  Received notification from [place] Patient Assistance program that patient has been successfully enrolled into their program to receive Xtandi from the manufacturer at $0 out of pocket until 09/24/2018.   I called and left a voicemail for the patient.  I left a message for him to call the office with questions or concerns.  Oral Oncology Clinic will continue to follow.  Gilmore Laroche, CPhT, [place] Oral Oncology Patient Advocate [phone] 03/01/2018 2:05 PM

## 2018-03-08 ENCOUNTER — Other Ambulatory Visit: Payer: Self-pay | Admitting: *Deleted

## 2018-03-08 DIAGNOSIS — C61 Malignant neoplasm of prostate: Secondary | ICD-10-CM

## 2018-03-08 MED ORDER — ENZALUTAMIDE 40 MG PO CAPS: 80.0000 mg | ORAL_CAPSULE | Freq: Every day | ORAL | 0 refills | Status: DC

## 2018-03-26 ENCOUNTER — Telehealth: Payer: Self-pay | Admitting: Pharmacist

## 2018-03-26 NOTE — Telephone Encounter (Signed)
Oral Chemotherapy Pharmacist Encounter  Successfully enrolled patient for copayment assistance funds from Patient [facility]) from the [place] amount: $7500 Effective dates: 12/26/2017 - 03/26/2019 ID: [id number] BIN: [id number] Group: [id number] PCN: [id number]  Patient updated with above information. Patient informed he will receive a welcome packet from the foundation in the mail. There is nothing to do with that information, it is for his records only. Pharmacy will bill any claims to the foundation on his behalf.  I will send grant information and a new prescription for Xtandi to [facility] on 04/01/2018 per patient request.  Patient is very appreciative of being able use [facility] as dispensing pharmacy for [place] again.  I explained to patient that pharmacy will have bill claims against his grant every 90 days to keep the grant active for the entire year.  Patient understands and is in agreement with above plan.  Johny Drilling, PharmD, BCPS, BCOP 03/26/2018 1:51 PM Oral Oncology Clinic [phone]

## 2018-03-26 NOTE — Telephone Encounter (Signed)
Oral Oncology Pharmacist Encounter  Received call from [place] patient assistance program that the dispensing pharmacy for their assistance program ([facility]) has been unable to get in contact with patient to schedule his first shipment of his Xtandi.  I contacted patient to provide him with contact information to dispensing pharmacy so that he may schedule his first shipment of Xtandi.  Patient informed me that he has already been in touch with dispensing pharmacy and that his first shipment of Xtandi should arrive today (03/26/2018).  Patient expressed dissatisfaction with having to wait around all day today until 8 PM in order to sign for his medication.  Patient informed that there are copayment assistance grants currently available for patient's disease state if he would like for the office to enroll him with 1 of those. Patient agreeable to that. This will mean that he will no longer receive his medication through the manufacturer assistance program, but instead will receive it with [facility] as he was doing before. Copayments will be covered by [employer]. Copayment grant has been secured on patient's behalf, it is updated in a separate encounter.  Great information and prescription for Gillermina Phy will be sent to [facility] on Monday (04/01/2018) We will cancel patient's application with the manufacturer.  Patient understands and is in agreement with above plan. Patient expressed understanding.  Johny Drilling, PharmD, BCPS, BCOP  03/26/2018 1:56 PM Oral Oncology Clinic [phone]

## 2018-04-02 ENCOUNTER — Other Ambulatory Visit: Payer: Self-pay | Admitting: Pharmacist

## 2018-04-02 DIAGNOSIS — C61 Malignant neoplasm of prostate: Secondary | ICD-10-CM

## 2018-04-02 MED ORDER — ENZALUTAMIDE 40 MG PO CAPS: 40.0000 mg | ORAL_CAPSULE | Freq: Every day | ORAL | 2 refills | Status: DC

## 2018-04-02 NOTE — Telephone Encounter (Signed)
Oral Oncology Pharmacist Encounter  Xtandi 40 mg capsules, take 1 capsule by mouth once daily prescription has been E scribed to [facility] per previous discussion with patient.  Patient [employer] grant information faxed to [facility] at [phone].  Johny Drilling, PharmD, BCPS, BCOP  04/02/2018 8:59 AM Oral Oncology Clinic [phone]

## 2018-04-04 ENCOUNTER — Telehealth: Payer: Self-pay

## 2018-04-04 NOTE — Telephone Encounter (Signed)
Received VM from specialty pharmacy regarding pt xtandi refill. Request for our office to get in touch with pt to determine needs from insurance to complete order and be able to process. Called Johny Drilling, Oral Oncology RPH to discuss. Denyse Amass aware of situation and has been working to get pt medication. Currently pt is not out of medication, refill is not emergent.

## 2018-04-10 ENCOUNTER — Telehealth: Payer: Self-pay | Admitting: Pharmacist

## 2018-04-10 NOTE — Telephone Encounter (Signed)
Oral Oncology Pharmacist Encounter  Received notification from [place] on 04/03/2018 that insurance authorization for [name] [name] has been rejected. They had reached out to the office for assistance.  I called [employer] at that time at [phone] to follow-up on insurance authorization.  Representative stated that rejection override has been placed on patient's Xtandi to fill at dispensing pharmacies as their system shows that patient has been enrolled in a manufacturer assistance program.  I stated representative that patient will no longer be receiving Xtandi through PAP program and instead has a copayment grant to support copayments at [facility]. Representative stated that until their system shows that patient was no longer active on manufacturer assistance that they would continue to reject any claims for Xtandi at any other pharmacy.  I called Xtandi support solutions program on 04/03/2018 to have patient removed from their program. I explained the situation to representative. Patient was removed from program. I received documentation stating patient was removed from manufacturer assistance program.  I called [employer] insurance back today to and make sure that override had been removed from their system. It has not been.  Representative stated that I should call patient assistance program back and have them remove patient from program again. After discussion with supervisor I was able to fax and letter from manufacturer assistance stating that patient is no longer enrolled into their program. This documentation was faxed to [phone]  Supervisor also stated that patient already has an approved PA on file for [facility] and as soon as the override is removed, a pharmacy will be able to process the claim without issue. PA approval letter for the [facility] was refaxed to office.  I will call [facility] on Friday  (04/12/2018) to have them process claim for Xtandi at that time. I will remind them to bill any copayment due to the copayment grant already shared with them last week.  I called patient and updated him on above information. All questions answered.  Patient expressed understanding and appreciation. Patient knows to call the office with any additional questions or concerns.  [name] [name], PharmD, BCPS, BCOP  04/10/2018 3:50 PM Oral Oncology Clinic [phone]

## 2018-04-12 ENCOUNTER — Telehealth: Payer: Self-pay | Admitting: Pharmacist

## 2018-04-12 NOTE — Telephone Encounter (Signed)
Oral Oncology Pharmacist Encounter  I called [facility] to follow-up on status of override of Xtandi. Representative was able to inform me that override that was on Xtandi due to manufacturer assistance has now been removed. Successful claim was processed at [place] on 04/11/2018.  Johny Drilling, PharmD, BCPS, BCOP  04/12/2018 2:20 PM Oral Oncology Clinic [phone]

## 2018-05-22 DIAGNOSIS — C61 Malignant neoplasm of prostate: Secondary | ICD-10-CM | POA: Diagnosis not present

## 2018-05-29 DIAGNOSIS — C61 Malignant neoplasm of prostate: Secondary | ICD-10-CM | POA: Diagnosis not present

## 2018-05-29 DIAGNOSIS — N390 Urinary tract infection, site not specified: Secondary | ICD-10-CM | POA: Diagnosis not present

## 2018-05-29 DIAGNOSIS — Z5111 Encounter for antineoplastic chemotherapy: Secondary | ICD-10-CM | POA: Diagnosis not present

## 2018-05-29 DIAGNOSIS — Z192 Hormone resistant malignancy status: Secondary | ICD-10-CM | POA: Diagnosis not present

## 2018-05-29 DIAGNOSIS — N393 Stress incontinence (female) (male): Secondary | ICD-10-CM | POA: Diagnosis not present

## 2018-05-29 DIAGNOSIS — C775 Secondary and unspecified malignant neoplasm of intrapelvic lymph nodes: Secondary | ICD-10-CM | POA: Diagnosis not present

## 2018-05-30 ENCOUNTER — Other Ambulatory Visit: Payer: Self-pay | Admitting: Urology

## 2018-05-30 DIAGNOSIS — C61 Malignant neoplasm of prostate: Secondary | ICD-10-CM

## 2018-05-31 ENCOUNTER — Inpatient Hospital Stay: Payer: Medicare HMO

## 2018-05-31 ENCOUNTER — Telehealth: Payer: Self-pay | Admitting: Oncology

## 2018-05-31 ENCOUNTER — Inpatient Hospital Stay: Payer: Medicare HMO | Attending: Oncology | Admitting: Oncology

## 2018-05-31 VITALS — BP 145/73 | HR 75 | Temp 98.5°F | Resp 18 | Ht 67.0 in | Wt 236.1 lb

## 2018-05-31 DIAGNOSIS — R591 Generalized enlarged lymph nodes: Secondary | ICD-10-CM | POA: Insufficient documentation

## 2018-05-31 DIAGNOSIS — C61 Malignant neoplasm of prostate: Secondary | ICD-10-CM

## 2018-05-31 DIAGNOSIS — Z79899 Other long term (current) drug therapy: Secondary | ICD-10-CM | POA: Diagnosis not present

## 2018-05-31 DIAGNOSIS — I10 Essential (primary) hypertension: Secondary | ICD-10-CM | POA: Diagnosis not present

## 2018-05-31 DIAGNOSIS — C7951 Secondary malignant neoplasm of bone: Secondary | ICD-10-CM

## 2018-05-31 DIAGNOSIS — N1330 Unspecified hydronephrosis: Secondary | ICD-10-CM

## 2018-05-31 LAB — CBC WITH DIFFERENTIAL (CANCER CENTER ONLY)
Basophils Absolute: 0 10*3/uL (ref 0.0–0.1)
Basophils Relative: 1 %
Eosinophils Absolute: 0.2 10*3/uL (ref 0.0–0.5)
Eosinophils Relative: 3 %
HEMATOCRIT: 36.6 % — AB (ref 38.4–49.9)
HEMOGLOBIN: 12.2 g/dL — AB (ref 13.0–17.1)
LYMPHS ABS: 1.3 10*3/uL (ref 0.9–3.3)
Lymphocytes Relative: 27 %
MCH: 32.4 pg (ref 27.2–33.4)
MCHC: 33.4 g/dL (ref 32.0–36.0)
MCV: 97.1 fL (ref 79.3–98.0)
MONOS PCT: 8 %
Monocytes Absolute: 0.4 10*3/uL (ref 0.1–0.9)
NEUTROS ABS: 2.8 10*3/uL (ref 1.5–6.5)
NEUTROS PCT: 61 %
Platelet Count: 153 10*3/uL (ref 140–400)
RBC: 3.77 MIL/uL — AB (ref 4.20–5.82)
RDW: 14.1 % (ref 11.0–14.6)
WBC: 4.6 10*3/uL (ref 4.0–10.3)

## 2018-05-31 LAB — CMP (CANCER CENTER ONLY)
ALBUMIN: 4 g/dL (ref 3.5–5.0)
ALK PHOS: 77 U/L (ref 38–126)
ALT: 11 U/L (ref 0–44)
ANION GAP: 8 (ref 5–15)
AST: 14 U/L — ABNORMAL LOW (ref 15–41)
BILIRUBIN TOTAL: 0.4 mg/dL (ref 0.3–1.2)
BUN: 22 mg/dL (ref 8–23)
CALCIUM: 9.7 mg/dL (ref 8.9–10.3)
CO2: 27 mmol/L (ref 22–32)
CREATININE: 0.92 mg/dL (ref 0.61–1.24)
Chloride: 108 mmol/L (ref 98–111)
GFR, Estimated: >60 mL/min (ref >60)
GLUCOSE: 107 mg/dL — AB (ref 70–99)
Potassium: 4.1 mmol/L (ref 3.5–5.1)
Sodium: 143 mmol/L (ref 135–145)
TOTAL PROTEIN: 6.7 g/dL (ref 6.5–8.1)

## 2018-05-31 NOTE — Telephone Encounter (Signed)
Appts scheduled AVS/Calendar printed per 9/6 los °

## 2018-05-31 NOTE — Progress Notes (Signed)
Hematology and Oncology Follow Up Visit  [name] [name] [id number] 07/04/1932 84 y.o. 05/31/2018 3:14 PM [name] [name], [name] Baltimore, MD   Principle Diagnosis: 84 year old man with castration-resistant prostate cancer diagnosed in 2016. He was initially diagnosed in 1998 with a Gleason score of 9.  He subsequently developed advanced disease with bone and lymphadenopathy.   Prior Therapy: He was started on firmagon in October 2014.   His PSA started to rise up to 1.91 in June 2016 after a rise from 0.78 in March 2016.PSA was 0.53 in November 2015.   Xtandi 160 mg daily started in August 2016.  Therapy interrupted until March 2018 because of poor tolerance.  Current therapy:   He is receiving Lupron every 3 months.  Xtandi 40 mg daily started in July 2018.    Interim History: [name] [name] is here for a follow-up visit.  Since her last visit, he reports no major changes in his health.  He continues to tolerate Xtandi without any complications.  He denies any excessive fatigue or tiredness.  He ambulates without any falls or syncope.  He does report lower extremity edema which is not changed dramatically.  His performance status and activity level remains unchanged.  He ambulates with the help of a cane.  He does not report any headaches, blurry vision, syncope or seizures.  He denies any alteration mental status or confusion.  He does not report any fevers, chills, sweats. He is not reporting any chest pain, palpitation, orthopnea.  He does not report any cough or hemoptysis or hematemesis. He does not report any nausea, vomiting, abdominal pain.  He denies any change in his bowel habits.  He denies any frequency urgency or hesitancy. He does not report any arthralgias or myalgias.  He does not report any lymphadenopathy, petechiae or skin rash. Remainder review of systems is negative.  Medications: I have reviewed the patient's current medications.  Current Outpatient Medications   Medication Sig Dispense Refill  . acetaminophen (TYLENOL) 325 MG tablet Take 650 mg by mouth every 6 (six) hours as needed for mild pain.     Marland Kitchen bismuth subsalicylate (PEPTO BISMOL) 262 MG/15ML suspension Take 30 mLs by mouth every 6 (six) hours as needed.    . Cyanocobalamin (VITAMIN B 12 PO) Take 1 tablet by mouth 3 (three) times a week.    . enzalutamide (XTANDI) 40 MG capsule Take 1 capsule (40 mg total) by mouth daily. 30 capsule 2  . levothyroxine (SYNTHROID, LEVOTHROID) 150 MCG tablet Take 150 mcg by mouth daily before breakfast.    . meloxicam (MOBIC) 15 MG tablet Take 15 mg by mouth daily.    . Multiple Vitamin (MULTIVITAMIN WITH MINERALS) TABS tablet Take 1 tablet by mouth every 7 (seven) days.    Marland Kitchen MYRBETRIQ 50 MG TB24 tablet Take 50 mg by mouth daily as needed.   0  . SHINGRIX injection TO BE ADMINISTERED BY PHARMACIST FOR IMMUNIZATION  0  . trimethoprim (TRIMPEX) 100 MG tablet Take 100 mg by mouth at bedtime.     No current facility-administered medications for this visit.      Allergies: No Known Allergies  Past Medical History, Surgical history, Social history, and Family History updated without any changes since last visit.  He denies any smoking or alcohol use.   Physical Exam:  Blood pressure (!) 145/73, pulse 75, temperature 98.5 F (36.9 C), temperature source Oral, resp. rate 18, height 5\' 7"  (1.702 m), weight 236 lb 1.6 oz (107.1 kg), SpO2  97 %.   ECOG: 1   General appearance: Comfortable appearing without any discomfort Head: Normocephalic without any trauma Oropharynx: Mucous membranes are moist and pink without any thrush or ulcers. Eyes: Pupils are equal and round reactive to light. Lymph nodes: No cervical, supraclavicular, inguinal or axillary lymphadenopathy.   Heart:regular rate and rhythm.  S1 and S2 with trace edema noted. Lung: Clear without any rhonchi or wheezes.  No dullness to percussion. Abdomin: Soft, nontender, nondistended with good bowel  sounds.  No hepatosplenomegaly. Musculoskeletal: No joint deformity or effusion.  Full range of motion noted. Neurological: No deficits noted on motor, sensory and deep tendon reflex exam. Skin: No petechial rash or dryness.  Appeared moist.    Lab Results: Lab Results  Component Value Date   WBC 4.7 10/02/2017   HGB 12.6 (L) 10/02/2017   HCT 37.9 (L) 10/02/2017   MCV 96.0 10/02/2017   PLT 191 10/02/2017     Chemistry      Component Value Date/Time   NA 137 10/02/2017 1435   NA 140 06/05/2017 1443   K 4.4 10/02/2017 1435   K 4.6 06/05/2017 1443   CL 104 10/02/2017 1435   CO2 25 10/02/2017 1435   CO2 24 06/05/2017 1443   BUN 24 10/02/2017 1435   BUN 22.7 06/05/2017 1443   CREATININE 1.41 (H) 10/02/2017 1435   CREATININE 1.2 06/05/2017 1443      Component Value Date/Time   CALCIUM 9.7 10/02/2017 1435   CALCIUM 9.4 06/05/2017 1443   ALKPHOS 67 10/02/2017 1435   ALKPHOS 66 06/05/2017 1443   AST 19 10/02/2017 1435   AST 17 06/05/2017 1443   ALT 19 10/02/2017 1435   ALT 15 06/05/2017 1443   BILITOT 0.6 10/02/2017 1435   BILITOT 0.41 06/05/2017 1443       Results for [name] [name], [name] "BILL" (MRN [id number]) as of 01/30/2018 14:47  Ref. Range 06/05/2017 14:43 10/02/2017 14:35  Prostate Specific Ag, Serum Latest Ref Range: 0.0 - 4.0 ng/mL <0.1 <0.1      Impression and Plan:   84 year old gentleman with the following issues:  1.  Castration-resistant prostate cancer with disease to the bone and lymphadenopathy documented in August 2016.    He continues to take Xtandi at 40 mg daily without any major complications.  His PSA is slowly rising although he is nonsymptomatic.  He does have a rapid doubling time although his PSA remains in the fractions without actual whole numbers.    Risks and benefits of continuing this medication versus switching to different therapy was reviewed today.  Consideration for increasing the dose of Xtandi was also reviewed.  After discussion  today, he prefers to keep the same dose at this time.  His PSA rises is concerning but he is asymptomatic and did experience a lot of complications with higher doses of Xtandi.  For the time being we will keep him on 40 mg daily and will repeat his PSA in 4 months.    2.  Androgen deprivation: He is currently taking Lupron every 3 months under the care of Dr. Jeffie Pollock which I recommended continuing indefinitely.  3.  Hydronephrosis: Followed by Dr. Jeffie Pollock.  Related to his prostate cancer and will repeat imaging studies for staging purposes if his PSA continues to rise rapidly.  4.  Hypertension: Blood pressure remains close to normal range at this time.  No complications related to [place].  5. Follow-up: Will be in 4 months for evaluation.  15 minutes spent  face-to-face with the patient today with greater than 50% spent reviewing the natural course of the disease, treatment options and complications related to long-term therapy.Zola Button, MD 9/6/20193:14 PM

## 2018-06-01 LAB — PROSTATE-SPECIFIC AG, SERUM (LABCORP)

## 2018-06-03 ENCOUNTER — Telehealth: Payer: Self-pay | Admitting: *Deleted

## 2018-06-03 NOTE — Telephone Encounter (Signed)
As noted below by Dr. Alen Blew, I tried calling patient on his cell phone; however, his cell phone is unable to receive messages. I'm calling to give him PSA results. Will try calling later.

## 2018-06-03 NOTE — Telephone Encounter (Signed)
-----   Message from Wyatt Portela, MD sent at 06/03/2018  7:39 AM EDT ----- Please let him know his PSA is still very low.

## 2018-06-10 ENCOUNTER — Encounter (HOSPITAL_COMMUNITY)
Admission: RE | Admit: 2018-06-10 | Discharge: 2018-06-10 | Disposition: A | Discharge status: Home / Self Care | Payer: Medicare HMO | Source: Ambulatory Visit | Attending: Urology | Admitting: Urology

## 2018-06-10 ENCOUNTER — Ambulatory Visit (HOSPITAL_COMMUNITY)
Admission: RE | Admit: 2018-06-10 | Discharge: 2018-06-10 | Disposition: A | Discharge status: Home / Self Care | Payer: Medicare HMO | Source: Ambulatory Visit | Attending: Urology | Admitting: Urology

## 2018-06-10 DIAGNOSIS — N202 Calculus of kidney with calculus of ureter: Secondary | ICD-10-CM | POA: Diagnosis not present

## 2018-06-10 DIAGNOSIS — R9721 Rising PSA following treatment for malignant neoplasm of prostate: Secondary | ICD-10-CM | POA: Diagnosis not present

## 2018-06-10 DIAGNOSIS — C61 Malignant neoplasm of prostate: Secondary | ICD-10-CM

## 2018-06-10 MED ORDER — TECHNETIUM TC 99M MEDRONATE IV KIT
21.3000 | PACK | Freq: Once | INTRAVENOUS | Status: AC | PRN
  Administered 2018-06-10: 21.3 via INTRAVENOUS

## 2018-06-20 DIAGNOSIS — N390 Urinary tract infection, site not specified: Secondary | ICD-10-CM | POA: Diagnosis not present

## 2018-06-20 DIAGNOSIS — N202 Calculus of kidney with calculus of ureter: Secondary | ICD-10-CM | POA: Diagnosis not present

## 2018-06-20 DIAGNOSIS — N210 Calculus in bladder: Secondary | ICD-10-CM | POA: Diagnosis not present

## 2018-06-24 ENCOUNTER — Other Ambulatory Visit: Payer: Self-pay | Admitting: Urology

## 2018-06-25 NOTE — Progress Notes (Signed)
Cbc and cmp 05-31-18 epic

## 2018-06-25 NOTE — Patient Instructions (Signed)
[name] [name]  06/25/2018   Your procedure is scheduled on: 06-27-18  Report to [facility] Main  Entrance  Report to admitting at     1015 AM    Call this number if you have problems the morning of surgery [phone]    Remember: Do not eat food or drink liquids :After Midnight. BRUSH YOUR TEETH MORNING OF SURGERY AND RINSE YOUR MOUTH OUT, NO CHEWING GUM CANDY OR MINTS.     Take these medicines the morning of surgery with A SIP OF WATER: myrbetriq, levothyroxine, allegra, xtandi, cipro                                You may not have any metal on your body including hair pins and              piercings  Do not wear jewelry,lotions, powders or perfumes, deodorant                Men may shave face and neck.   Do not bring valuables to the hospital. [place].  Contacts, dentures or bridgework may not be worn into surgery.      Patients discharged the day of surgery will not be allowed to drive home.  Name and phone number of your driver:  Special Instructions: N/A              Please read over the following fact sheets you were given: _____________________________________________________________________             [facility] - Preparing for Surgery Before surgery, you can play an important role.  Because skin is not sterile, your skin needs to be as free of germs as possible.  You can reduce the number of germs on your skin by washing with CHG (chlorahexidine gluconate) soap before surgery.  CHG is an antiseptic cleaner which kills germs and bonds with the skin to continue killing germs even after washing. Please DO NOT use if you have an allergy to CHG or antibacterial soaps.  If your skin becomes reddened/irritated stop using the CHG and inform your nurse when you arrive at Short Stay. Do not shave (including legs and underarms) for at least 48 hours prior to the first CHG shower.  You may shave your  face/neck. Please follow these instructions carefully:  1.  Shower with CHG Soap the night before surgery and the  morning of Surgery.  2.  If you choose to wash your hair, wash your hair first as usual with your  normal  shampoo.  3.  After you shampoo, rinse your hair and body thoroughly to remove the  shampoo.                           4.  Use CHG as you would any other liquid soap.  You can apply chg directly  to the skin and wash                       Gently with a scrungie or clean washcloth.  5.  Apply the CHG Soap to your body ONLY FROM THE NECK DOWN.   Do not use on face/  open                           Wound or open sores. Avoid contact with eyes, ears mouth and genitals (private parts).                       Wash face,  Genitals (private parts) with your normal soap.             6.  Wash thoroughly, paying special attention to the area where your surgery  will be performed.  7.  Thoroughly rinse your body with warm water from the neck down.  8.  DO NOT shower/wash with your normal soap after using and rinsing off  the CHG Soap.                9.  Pat yourself dry with a clean towel.            10.  Wear clean pajamas.            11.  Place clean sheets on your bed the night of your first shower and do not  sleep with pets. Day of Surgery : Do not apply any lotions/deodorants the morning of surgery.  Please wear clean clothes to the hospital/surgery center.  FAILURE TO FOLLOW THESE INSTRUCTIONS MAY RESULT IN THE CANCELLATION OF YOUR SURGERY PATIENT SIGNATURE_________________________________  NURSE SIGNATURE__________________________________  ________________________________________________________________________

## 2018-06-26 ENCOUNTER — Encounter (HOSPITAL_COMMUNITY)
Admission: RE | Admit: 2018-06-26 | Discharge: 2018-06-26 | Disposition: A | Discharge status: Home / Self Care | Payer: Medicare HMO | Source: Ambulatory Visit | Attending: Urology | Admitting: Urology

## 2018-06-26 ENCOUNTER — Other Ambulatory Visit (HOSPITAL_COMMUNITY): Payer: Self-pay | Admitting: Pharmacist

## 2018-06-26 ENCOUNTER — Encounter (HOSPITAL_COMMUNITY): Payer: Self-pay

## 2018-06-26 ENCOUNTER — Other Ambulatory Visit: Payer: Self-pay

## 2018-06-26 DIAGNOSIS — I4510 Unspecified right bundle-branch block: Secondary | ICD-10-CM | POA: Insufficient documentation

## 2018-06-26 DIAGNOSIS — N201 Calculus of ureter: Secondary | ICD-10-CM | POA: Insufficient documentation

## 2018-06-26 DIAGNOSIS — Z79899 Other long term (current) drug therapy: Secondary | ICD-10-CM | POA: Diagnosis not present

## 2018-06-26 DIAGNOSIS — Z01818 Encounter for other preprocedural examination: Secondary | ICD-10-CM

## 2018-06-26 DIAGNOSIS — N202 Calculus of kidney with calculus of ureter: Secondary | ICD-10-CM | POA: Diagnosis not present

## 2018-06-26 DIAGNOSIS — Z95810 Presence of automatic (implantable) cardiac defibrillator: Secondary | ICD-10-CM | POA: Diagnosis not present

## 2018-06-26 DIAGNOSIS — Z8546 Personal history of malignant neoplasm of prostate: Secondary | ICD-10-CM | POA: Diagnosis not present

## 2018-06-26 DIAGNOSIS — I10 Essential (primary) hypertension: Secondary | ICD-10-CM | POA: Diagnosis not present

## 2018-06-26 DIAGNOSIS — N210 Calculus in bladder: Secondary | ICD-10-CM | POA: Diagnosis not present

## 2018-06-26 DIAGNOSIS — E039 Hypothyroidism, unspecified: Secondary | ICD-10-CM | POA: Diagnosis not present

## 2018-06-26 HISTORY — DX: Essential (primary) hypertension: I10

## 2018-06-26 HISTORY — DX: Unspecified asthma, uncomplicated: J45.909

## 2018-06-26 HISTORY — DX: Cardiac murmur, unspecified: R01.1

## 2018-06-26 MED ORDER — GENTAMICIN SULFATE 40 MG/ML IJ SOLN: 400.0000 mg | Freq: Once | INTRAVENOUS | Status: DC

## 2018-06-26 MED ORDER — GENTAMICIN SULFATE 40 MG/ML IJ SOLN: 400.0000 mg | INTRAVENOUS | Status: DC

## 2018-06-26 MED ORDER — GENTAMICIN SULFATE 40 MG/ML IJ SOLN
400.0000 mg | INTRAVENOUS | Status: AC
  Administered 2018-06-27: 400 mg via INTRAVENOUS
  Filled 2018-06-26 (×2): qty 10

## 2018-06-26 NOTE — H&P (Signed)
CC: I have ureteral stone.  HPI: [name] [name] is a 84 year-old male established patient who is here for ureteral stone.    He has a large right mid ureteral stone with possible bladder stones and a right lower pole partial staghorn stone on CT and KUB. He has no flank pain. He has had no fever. He has chronic incontinence with CRCP. He has had no hematuria.     ALLERGIES: No Allergies    MEDICATIONS: Myrbetriq 50 mg tablet, extended release 24 hr 1 tablet PO Daily  Allegra Allergy  Caffeine  Meloxicam  Synthroid  Trimethoprim 100 mg tablet 1 tablet PO Q PM     GU PSH: Biopsy Skin Lesion - 2008 Cysto Bladder Stone >2.5cm - 2017, 2017 Cysto Uretero Lithotripsy - 2015 Cystoscopy And Treatment - 2012 Cystoscopy And Treatment - 2012 Cystoscopy Fulguration - 2018 Cystoscopy Insert Stent, Left - 2017, Left - 2017, 2015 ESWL - 2008 Locm 300-399Mg /Ml Iodine,1Ml - 06/10/2018, 2017 PLACE RT DEVICE/MARKER, PROS - 2008 Revise Bladder Neck - 2014 Ureteroscopic stone removal, Left - 2017      PSH Notes: Cystoscopy With Ureteroscopy With Lithotripsy, Cystoscopy With Insertion Of Ureteral Stent Left, Transurethral Resection Of Bladder Neck, Cystoscopy With Removal Of Ureteral Calculus Right, Cystoscopy, Ureteral Meatotomy, Treat Ureterocele Right, Lithotripsy, Prostate Place Interstitial Dev For Radiation Guide Multiple, Biopsy Skin, Leg Repair   NON-GU PSH: None   GU PMH: Prostate Cancer (Worsening), His PSA continues to rise with a 3 mo PSADT. I am going to restage him with CT and bone scan and will continue Trelstar and Xtandi pending his visit with Dr. Alen Blew on Friday. - 05/29/2018, (Worsening), His PSA is up to 0.061 which is a doubling over the past 3 months. I will have him continue the Trelstar and Xtandi but try to go back up to 2 on the xtandi and I will repeat labs in 3 months prior to his next injection. , - 02/20/2018, He is doing well on current therapy and will get Trelstar  today. f/u in 3 months with labs for his next injection. he will continue the Xtandi. , - 11/21/2017, He is doing well on combination therapy with a further decline in the PSA. He was given Administrator today and will return in 3 months with labs for his next injection., - 08/15/2017 (Improving), He has had an excellent PSA response to the Xtandi even on 1 pill daily and will continue the trelstar. F/u in 3 months with PSA for his next injection. , - 05/09/2017, He is back on xtandi but hasn't had a PSA yet. He received trelstar today and will return for his next injection with a PSA in 3 months. , - 01/29/2017, - 01/15/2017, - 2018 (Worsening), He has progressive CRCP with worsening disease off of Xtandi and he has new left hydro that could be from a stone or malignant obstruction. I am going to have Dr. Alen Blew consider resuming Gillermina Phy or starting [place]. , - 2018 (Worsening), - 07/19/2016 (Worsening), The restaging studies showed no obvious mets despite the CRCP with rising psa. 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 - 2018, - 2018 (Stable), - 07/19/2016, He has bilateral  stones, right > left., - 2017, Bilateral kidney stones, - 2017 Ureteral calculus (Stable) - 2017, Calculus of ureter, - 2015 Metastatic prostate cancer to other GU organs - 2017 Urinary incontinence, Unspec - 2017 Urge incontinence, Urge incontinence of urine - 2017 Urinary Tract Inf, Unspec site, Urinary tract infection - 2017, Pyuria, - 2016 Stress Incontinence, Male stress incontinence - 2017, Male Stress Incontinence, - 2014 Elevated PSA, Elevated prostate specific antigen (PSA) -  2014 Bladder-neck stenosis/contracture, Bladder neck contracture - 2014 ED due to arterial insufficiency, Erectile dysfunction due to arterial insufficiency - 2014 Gross hematuria, Gross Hematuria - 2014 History of prostate cancer, Prostate Cancer - 2014 History of urolithiasis, Nephrolithiasis - 2014 Other microscopic hematuria, Microscopic Hematuria - 2014 Polyuria, Polyuria - 2014      PMH Notes:  2007-10-25 16:14:38 - Note: Urinary Tract Infection  2006-12-11 15:58:45 - Note: Frequent, Small Amounts Of Urine  2006-10-12 14:13:58 - Note: Skin Cancer   NON-GU PMH: Constipation, unspecified, He has constipation with breakthrough diarrhea. I have recommended daily Miralax. - 05/29/2018 Other fatigue, Fatigue - 2016 Encounter for general adult medical examination without abnormal findings, Encounter for preventive health examination - 2016, Routine history and physical examination of adult, - 2014 Cellulitis of abdominal wall, Cellulitis of right abdominal wall - 2015 Cardiac murmur, unspecified, Murmurs - 2014 Muscle weakness (generalized), Muscle weakness - 2014 Other lack of coordination, Other lack of coordination - 2014 Personal history of other endocrine, nutritional and metabolic disease, History of hypothyroidism - 2014    FAMILY HISTORY: Cancer - Runs In Family Death In The Family Father - Father Death In The Family Mother - Mother Diabetes - Runs In Family Family Health Status Number - Runs In Family   SOCIAL HISTORY: Marital Status: Married Preferred Language: English; Ethnicity: Not Hispanic Or Latino; Race: White Current Smoking Status: Patient has never smoked.  Has never drank.  Does not drink caffeine. Patient's occupation is/was retired.     Notes: Exercise Habits, Activities Of Daily Living, Self-reliant In Usual Daily Activities, Living Independently With Spouse, Drug Use, Never A Smoker, Tobacco Use, Occupation:, Alcohol Use, Caffeine Use, Marital History -  Currently Married   REVIEW OF SYSTEMS:    GU Review Male:   Patient denies frequent urination, hard to postpone urination, burning/ pain with urination, get up at night to urinate, leakage of urine, stream starts and stops, trouble starting your stream, have to strain to urinate , erection problems, and penile pain.  Gastrointestinal (Upper):   Patient denies nausea, vomiting, and indigestion/ heartburn.  Gastrointestinal (Lower):   Patient denies diarrhea and constipation.  Constitutional:   Patient denies fever, night sweats, weight loss, and fatigue.  Skin:   Patient denies skin rash/ lesion and itching.  Eyes:   Patient denies blurred vision and double vision.  Ears/ Nose/ Throat:   Patient denies sore throat and sinus problems.  Hematologic/Lymphatic:   Patient denies swollen glands and easy bruising.  Cardiovascular:   Patient denies leg swelling and chest pains.  Respiratory:   Patient denies cough and shortness of breath.  Endocrine:   Patient denies excessive thirst.  Musculoskeletal:   Patient denies back pain and joint pain.  Neurological:   Patient denies headaches and dizziness.  Psychologic:   Patient denies depression and anxiety.   VITAL SIGNS:      06/20/2018 12:54 PM  BP 132/72 mmHg  Pulse 70 /min  Temperature 99.3 F / 37.3 C   MULTI-SYSTEM PHYSICAL EXAMINATION:    Constitutional: Well-nourished.  No physical deformities. Normally developed. Good grooming.  Respiratory: No labored breathing, no use of accessory muscles. CTA  Cardiovascular: Normal temperature, RRR without murmur     PAST DATA REVIEWED:  Source Of History:  Patient  Urine Test Review:   Urinalysis  X-Ray Review: KUB: Reviewed Films. Discussed With Patient.     05/22/18 02/13/18 11/14/17 08/08/17 05/02/17 10/16/16 07/12/16 03/30/16  PSA  Total PSA 0.12 ng/mL 0.061 ng/mL 0.029 ng/mL 0.029 ng/mL 0.054 ng/dL 15.70 ng/dl 5.36  2.38 ng/dl    02/13/18 04/06/16 03/30/16 06/24/15 03/18/15 12/09/14  08/15/14 04/14/14  Hormones  Testosterone, Total 20.0 ng/dL 30.9 pg/dL 37.1 pg/dL 54  41  25  35  39     PROCEDURES:         KUB - 74018  A single view of the abdomen is obtained. There is a 53mm stone over the right pelvis consistent with the stone on his recent CT. There is a 5mm RLP partial staghorn stone. There are two 6-23mm shadows over the right bladder that are suggestive of stones. He has prostate seeds and a right THR. No gas or soft tissue abnormalities are noted.                Urinalysis w/Scope Dipstick Dipstick Cont'd Micro  Color: Yellow Bilirubin: Neg mg/dL WBC/hpf: 6 - 10/hpf  Appearance: Clear Ketones: Neg mg/dL RBC/hpf: 3 - 10/hpf  Specific Gravity: 1.025 Blood: 2+ ery/uL Bacteria: NS (Not Seen)  pH: 6.0 Protein: 1+ mg/dL Cystals: NS (Not Seen)  Glucose: Neg mg/dL Urobilinogen: 0.2 mg/dL Casts: NS (Not Seen)    Nitrites: Positive Trichomonas: Not Present    Leukocyte Esterase: 3+ leu/uL Mucous: Not Present      Epithelial Cells: 0 - 5/hpf      Yeast: NS (Not Seen)      Sperm: Not Present    Notes: Microscopic done on unconcentrated urine    ASSESSMENT:      ICD-10 Details  1 GU:   Ureteral calculus - N20.1 He has a large right mid ureteral stone that has not moved. He may also have 2 small bladder stones. he has a renal stone in the lower pole as well. I am going to get him set up for cystoscopy with removal of bladder stones and will attempt ureteroscopy with laser and stent. If I can't get up to the stone and have to leave a stent, I will consider ESWL for treatment of the stone. If I can't get by the stone at all, I will have a perc placed and remove his entire right stone burden.   2   Bladder Stone - N21.0   3   Urinary Tract Inf, Unspec site - N39.0 Urine culture today.   4   Renal calculus - N20.0    PLAN:           Orders Labs Urine Culture          Schedule Return Visit/Planned Activity: Next Available Appointment - Schedule Surgery           Document Letter(s):  Created for Patient: Clinical Summary

## 2018-06-26 NOTE — Progress Notes (Signed)
Dr. Ambrose Pancoast anesthesia aware of final ekg.

## 2018-06-27 ENCOUNTER — Ambulatory Visit (HOSPITAL_COMMUNITY)
Admission: RE | Admit: 2018-06-27 | Discharge: 2018-06-27 | Disposition: A | Discharge status: Home / Self Care | Payer: Medicare HMO | Source: Ambulatory Visit | Attending: Urology | Admitting: Urology

## 2018-06-27 ENCOUNTER — Ambulatory Visit (HOSPITAL_COMMUNITY): Payer: Medicare HMO

## 2018-06-27 ENCOUNTER — Ambulatory Visit (HOSPITAL_COMMUNITY): Payer: Medicare HMO | Admitting: Anesthesiology

## 2018-06-27 ENCOUNTER — Encounter (HOSPITAL_COMMUNITY): Payer: Self-pay | Admitting: *Deleted

## 2018-06-27 ENCOUNTER — Encounter (HOSPITAL_COMMUNITY)
Admission: RE | Disposition: A | Discharge status: Home / Self Care | Payer: Self-pay | Source: Ambulatory Visit | Attending: Urology

## 2018-06-27 DIAGNOSIS — N200 Calculus of kidney: Secondary | ICD-10-CM | POA: Diagnosis not present

## 2018-06-27 DIAGNOSIS — Z8546 Personal history of malignant neoplasm of prostate: Secondary | ICD-10-CM | POA: Insufficient documentation

## 2018-06-27 DIAGNOSIS — Z95810 Presence of automatic (implantable) cardiac defibrillator: Secondary | ICD-10-CM | POA: Insufficient documentation

## 2018-06-27 DIAGNOSIS — N289 Disorder of kidney and ureter, unspecified: Secondary | ICD-10-CM | POA: Diagnosis not present

## 2018-06-27 DIAGNOSIS — N210 Calculus in bladder: Secondary | ICD-10-CM | POA: Diagnosis not present

## 2018-06-27 DIAGNOSIS — I10 Essential (primary) hypertension: Secondary | ICD-10-CM | POA: Diagnosis not present

## 2018-06-27 DIAGNOSIS — N202 Calculus of kidney with calculus of ureter: Secondary | ICD-10-CM | POA: Diagnosis not present

## 2018-06-27 DIAGNOSIS — Z79899 Other long term (current) drug therapy: Secondary | ICD-10-CM | POA: Diagnosis not present

## 2018-06-27 DIAGNOSIS — N201 Calculus of ureter: Secondary | ICD-10-CM | POA: Diagnosis not present

## 2018-06-27 DIAGNOSIS — E039 Hypothyroidism, unspecified: Secondary | ICD-10-CM | POA: Insufficient documentation

## 2018-06-27 DIAGNOSIS — C61 Malignant neoplasm of prostate: Secondary | ICD-10-CM | POA: Diagnosis not present

## 2018-06-27 HISTORY — PX: CYSTOSCOPY/URETEROSCOPY/HOLMIUM LASER/STENT PLACEMENT: SHX6546

## 2018-06-27 SURGERY — CYSTOSCOPY/URETEROSCOPY/HOLMIUM LASER/STENT PLACEMENT
Anesthesia: General | Laterality: Right

## 2018-06-27 MED ORDER — EPHEDRINE SULFATE-NACL 50-0.9 MG/10ML-% IV SOSY
PREFILLED_SYRINGE | INTRAVENOUS | Status: DC | PRN
  Administered 2018-06-27: 5 mg via INTRAVENOUS
  Administered 2018-06-27: 10 mg via INTRAVENOUS

## 2018-06-27 MED ORDER — FENTANYL CITRATE (PF) 100 MCG/2ML IJ SOLN
INTRAMUSCULAR | Status: DC | PRN
  Administered 2018-06-27 (×3): 25 ug via INTRAVENOUS

## 2018-06-27 MED ORDER — HYDROCODONE-ACETAMINOPHEN 5-325 MG PO TABS: 1.0000 | ORAL_TABLET | ORAL | 0 refills | Status: AC | PRN

## 2018-06-27 MED ORDER — PHENYLEPHRINE 40 MCG/ML (10ML) SYRINGE FOR IV PUSH (FOR BLOOD PRESSURE SUPPORT)
PREFILLED_SYRINGE | INTRAVENOUS | Status: DC | PRN
  Administered 2018-06-27: 80 ug via INTRAVENOUS

## 2018-06-27 MED ORDER — CEFAZOLIN SODIUM-DEXTROSE 2-4 GM/100ML-% IV SOLN
2.0000 g | INTRAVENOUS | Status: AC
  Administered 2018-06-27: 2 g via INTRAVENOUS
  Filled 2018-06-27: qty 100

## 2018-06-27 MED ORDER — FENTANYL CITRATE (PF) 100 MCG/2ML IJ SOLN: 25.0000 ug | INTRAMUSCULAR | Status: DC | PRN

## 2018-06-27 MED ORDER — LACTATED RINGERS IV SOLN
INTRAVENOUS | Status: DC
  Administered 2018-06-27 (×2): via INTRAVENOUS

## 2018-06-27 MED ORDER — SODIUM CHLORIDE 0.9 % IR SOLN
Status: DC | PRN
  Administered 2018-06-27: 3000 mL via INTRAVESICAL

## 2018-06-27 MED ORDER — ONDANSETRON HCL 4 MG/2ML IJ SOLN
INTRAMUSCULAR | Status: DC | PRN
  Administered 2018-06-27: 4 mg via INTRAVENOUS

## 2018-06-27 MED ORDER — LIDOCAINE 2% (20 MG/ML) 5 ML SYRINGE
INTRAMUSCULAR | Status: DC | PRN
  Administered 2018-06-27: 100 mg via INTRAVENOUS

## 2018-06-27 MED ORDER — FENTANYL CITRATE (PF) 100 MCG/2ML IJ SOLN
INTRAMUSCULAR | Status: AC
  Filled 2018-06-27: qty 2

## 2018-06-27 MED ORDER — PROPOFOL 10 MG/ML IV BOLUS
INTRAVENOUS | Status: AC
  Filled 2018-06-27: qty 20

## 2018-06-27 MED ORDER — 0.9 % SODIUM CHLORIDE (POUR BTL) OPTIME
TOPICAL | Status: DC | PRN
  Administered 2018-06-27: 1000 mL

## 2018-06-27 MED ORDER — DEXAMETHASONE SODIUM PHOSPHATE 10 MG/ML IJ SOLN
INTRAMUSCULAR | Status: DC | PRN
  Administered 2018-06-27: 10 mg via INTRAVENOUS

## 2018-06-27 MED ORDER — LIDOCAINE 2% (20 MG/ML) 5 ML SYRINGE
INTRAMUSCULAR | Status: AC
  Filled 2018-06-27: qty 5

## 2018-06-27 MED ORDER — ONDANSETRON HCL 4 MG/2ML IJ SOLN
INTRAMUSCULAR | Status: AC
  Filled 2018-06-27: qty 2

## 2018-06-27 MED ORDER — PROMETHAZINE HCL 25 MG/ML IJ SOLN: 6.2500 mg | INTRAMUSCULAR | Status: DC | PRN

## 2018-06-27 MED ORDER — IOHEXOL 300 MG/ML  SOLN
INTRAMUSCULAR | Status: DC | PRN
  Administered 2018-06-27: 5 mL via URETHRAL

## 2018-06-27 MED ORDER — PROPOFOL 10 MG/ML IV BOLUS
INTRAVENOUS | Status: DC | PRN
  Administered 2018-06-27: 170 mg via INTRAVENOUS
  Administered 2018-06-27: 30 mg via INTRAVENOUS

## 2018-06-27 SURGICAL SUPPLY — 23 items
BAG URO CATCHER STRL LF (MISCELLANEOUS) ×3 IMPLANT
BASKET STONE NCOMPASS (UROLOGICAL SUPPLIES) IMPLANT
CATH URET 5FR 28IN OPEN ENDED (CATHETERS) ×2 IMPLANT
CATH URET DUAL LUMEN 6-10FR 50 (CATHETERS) ×1 IMPLANT
CLOTH BEACON ORANGE TIMEOUT ST (SAFETY) ×3 IMPLANT
EXTRACTOR STONE NITINOL NGAGE (UROLOGICAL SUPPLIES) ×3 IMPLANT
FIBER LASER FLEXIVA 1000 (UROLOGICAL SUPPLIES) IMPLANT
FIBER LASER FLEXIVA 365 (UROLOGICAL SUPPLIES) IMPLANT
FIBER LASER FLEXIVA 550 (UROLOGICAL SUPPLIES) IMPLANT
FIBER LASER TRAC TIP (UROLOGICAL SUPPLIES) ×2 IMPLANT
GLOVE SURG SS PI 8.0 STRL IVOR (GLOVE) ×2 IMPLANT
GOWN STRL REUS W/TWL XL LVL3 (GOWN DISPOSABLE) ×3 IMPLANT
GUIDEWIRE STR DUAL SENSOR (WIRE) ×3 IMPLANT
IV NS 1000ML (IV SOLUTION)
IV NS 1000ML BAXH (IV SOLUTION) ×1 IMPLANT
IV NS IRRIG 3000ML ARTHROMATIC (IV SOLUTION) ×1 IMPLANT
MANIFOLD NEPTUNE II (INSTRUMENTS) ×3 IMPLANT
PACK CYSTO (CUSTOM PROCEDURE TRAY) ×3 IMPLANT
SHEATH URETERAL 12FRX35CM (MISCELLANEOUS) ×3 IMPLANT
STENT URET 6FRX24 CONTOUR (STENTS) ×2 IMPLANT
SYRINGE IRR TOOMEY STRL 70CC (SYRINGE) IMPLANT
TUBING CONNECTING 10 (TUBING) ×3 IMPLANT
TUBING UROLOGY SET (TUBING) ×3 IMPLANT

## 2018-06-27 NOTE — Discharge Instructions (Signed)

## 2018-06-27 NOTE — Anesthesia Postprocedure Evaluation (Signed)
Anesthesia Post Note  Patient: Trish Fountain  Procedure(s) Performed: CYSTOSCOPY/RIGHT URETEROSCOPY/ RIGHT RETROGRADE PYELOGRAM/ HOLMIUM LASER/ RIGHT STENT PLACEMENT (Right )     Patient location during evaluation: PACU Anesthesia Type: General Level of consciousness: awake and alert Pain management: pain level controlled Vital Signs Assessment: post-procedure vital signs reviewed and stable Respiratory status: spontaneous breathing, nonlabored ventilation, respiratory function stable and patient connected to nasal cannula oxygen Cardiovascular status: blood pressure returned to baseline and stable Postop Assessment: no apparent nausea or vomiting Anesthetic complications: no    Last Vitals:  Vitals:   06/27/18 1415 06/27/18 1437  BP: 91/79 (!) 148/81  Pulse: 67 77  Resp: 15 20  Temp: 36.6 C 36.8 C  SpO2: 97% 95%    Last Pain:  Vitals:   06/27/18 1009  TempSrc: Oral                 Kazimir Hartnett S

## 2018-06-27 NOTE — Anesthesia Preprocedure Evaluation (Signed)
Anesthesia Evaluation  Patient identified by MRN, date of birth, ID band Patient awake    Reviewed: Allergy & Precautions, NPO status , Patient's Chart, lab work & pertinent test results  Airway Mallampati: II  TM Distance: >3 FB Neck ROM: Full    Dental no notable dental hx.    Pulmonary neg pulmonary ROS,    Pulmonary exam normal breath sounds clear to auscultation       Cardiovascular hypertension, Normal cardiovascular exam Rhythm:Regular Rate:Normal     Neuro/Psych negative neurological ROS  negative psych ROS   GI/Hepatic negative GI ROS, Neg liver ROS,   Endo/Other  Hypothyroidism   Renal/GU Recurrent prostate ca  negative genitourinary   Musculoskeletal negative musculoskeletal ROS (+)   Abdominal   Peds negative pediatric ROS (+)  Hematology negative hematology ROS (+)   Anesthesia Other Findings   Reproductive/Obstetrics negative OB ROS                             Anesthesia Physical Anesthesia Plan  ASA: III  Anesthesia Plan: General   Post-op Pain Management:    Induction: Intravenous  PONV Risk Score and Plan: 2 and Ondansetron, Dexamethasone and Treatment may vary due to age or medical condition  Airway Management Planned: LMA  Additional Equipment:   Intra-op Plan:   Post-operative Plan: Extubation in OR  Informed Consent: I have reviewed the patients History and Physical, chart, labs and discussed the procedure including the risks, benefits and alternatives for the proposed anesthesia with the patient or authorized representative who has indicated his/her understanding and acceptance.   Dental advisory given  Plan Discussed with: CRNA and Surgeon  Anesthesia Plan Comments:         Anesthesia Quick Evaluation

## 2018-06-27 NOTE — Anesthesia Procedure Notes (Signed)
Procedure Name: LMA Insertion Date/Time: 06/27/2018 12:25 PM Performed by: Jayelle Page, Clinical cytogeneticist D, CRNA Pre-anesthesia Checklist: Patient identified, Emergency Drugs available, Suction available, Patient being monitored and Timeout performed Patient Re-evaluated:Patient Re-evaluated prior to induction Oxygen Delivery Method: Circle system utilized Preoxygenation: Pre-oxygenation with 100% oxygen Induction Type: IV induction Ventilation: Mask ventilation without difficulty LMA: LMA inserted LMA Size: 5.0 Number of attempts: 1 Placement Confirmation: positive ETCO2 and breath sounds checked- equal and bilateral Tube secured with: Tape Dental Injury: Teeth and Oropharynx as per pre-operative assessment  Comments: Inserted by Viann Fish, SRNA

## 2018-06-27 NOTE — Op Note (Signed)
Procedure: 1.  Cystoscopy with right retrograde pyelogram and interpretation. 2.  Right ureteroscopic stone extraction with holmium laser application and insertion of right double-J stent.  Preop diagnosis: 18 mm right mid ureteral stone and possible bladder stones.  Postoperative diagnosis: Same with dislodgment of large renal stones.  Surgeon: Dr. [name].  Anesthesia: General.  Specimen: Stone fragments.  Drain: 6 [place] by 24 cm contour double-J stent without tether.  EBL: Minimal.  Complications: None.  Indications: [name] [name] is an 84 year old white male with a history of recurrent urolithiasis who recently presented and was found to have an 18 mm right mid ureteral stone it was felt that ureteroscopic stone extraction with laser application was indicated.  There were also some calcifications over the bladder that were suspicious for stones was felt they could be removed simultaneously.  He has known right renal stones consistent with a partial staghorn in the lower pole intent was not to treat those at this procedure.  Procedure: He was taken operating room where a general anesthetic was induced.  He is been given Ancef and gentamicin.  He was fitted with PAS hose and placed in the dorsolithotomy position.  His perineum and genitalia were prepped with Betadine solution he was draped in usual sterile fashion.  Cystoscopy was performed using the 23 [place] scope and 30 degree lens.  Examination revealed a normal urethra.  The external sphincter was somewhat patulous.  His prostatic urethra had evidence of prior resection with pale mucosa but no evidence of recurrent prostate tumor.  The bladder wall was smooth minimal erythema.  No tumors or stones were noted.  The right ureteral orifice was was normal.  The left ureteral orifice was laterally displaced and patulous from prior resection.  A 5 French opening catheter was placed in the right ureteral orifice and contrast was instilled.     Examination revealed a normal caliber distal ureter with a filling defect in the upper mid ureter consistent with his large stone and proximal dilation.  There were filling defects in the lower pole of the kidney consistent with his known stones and also one in the upper pole.  A guidewire was then passed through the opening catheter to the kidney and the cystoscope was removed.  A 6.5 French semirigid ureteroscope was passed over the wire however I was unable to advance it over the iliacs to get to the stone.  A second wire was then placed and a 35 cm digital access sheath was then placed over the working wire to just below the stone.  The inner core and wire were removed.  A dual-lumen digital flexible scope was then passed through the sheath to the level of the stone.  A 200 m tract tip laser fiber was passed at the settings initially on 0.5 J and 10 Hz.  This fragmentation progressed the rate was increased to 50 Hz.  The stone readily fragmented into small pieces.  An engage basket was then used to retrieve several of the stone fragments with additional lasering as required.  Once the mid ureteral area was cleared of all large fragments, the scope was advanced into the proximal ureter.  Once into the renal pelvis I located several large fragments that appeared to have flushed back from the ureteral stone but there was also a large renal pelvic stone approximately 15 or more millimeters in size there is previously been in the lower calyceal infundibulum additional smaller stones were present in the renal pelvis and it appeared that  the distention from ureteroscopic fluid had loosened the stones from the calyceal locations.  The stone burden was too excessive to manage comfortably with the laser so the ureteroscope was removed followed by the access sheath.  The cystoscope was reinserted over the wire and a 6 French 24 cm contour double-J stent was passed to the kidney under fluoroscopic guidance and  the wire was removed leaving a good coil in the kidney and a good coil in the bladder.  Final fluoroscopy also demonstrated the stones adjacent to the stent loop in the renal pelvis.  The cystoscope was removed after the bladder was drained.  He was taken down from lithotomy position, his anesthetic was reversed and he was moved to recovery in stable condition.  There were no complications.

## 2018-06-27 NOTE — Interval H&P Note (Signed)
History and Physical Interval Note:  06/27/2018 12:06 PM  [name] [name]  has presented today for surgery, with the diagnosis of RIGHT MID URETERAL AND BLADDER STONES  The various methods of treatment have been discussed with the patient and family. After consideration of risks, benefits and other options for treatment, the patient has consented to  Procedure(s): CYSTOSCOPY WITH LITHOLAPAXY (N/A) RIGHT URETEROSCOPY/HOLMIUM LASER/STENT PLACEMENT (Right) as a surgical intervention .  The patient's history has been reviewed, patient examined, no change in status, stable for surgery.  I have reviewed the patient's chart and labs.  Questions were answered to the patient's satisfaction.     [name]

## 2018-06-27 NOTE — Transfer of Care (Signed)
Immediate Anesthesia Transfer of Care Note  Patient: [name] [name]  Procedure(s) Performed: CYSTOSCOPY/RIGHT URETEROSCOPY/ RIGHT RETROGRADE PYELOGRAM/ HOLMIUM LASER/ RIGHT STENT PLACEMENT (Right )  Patient Location: PACU  Anesthesia Type:General  Level of Consciousness: awake, alert , oriented and drowsy  Airway & Oxygen Therapy: Patient Spontanous Breathing and Patient connected to face mask oxygen  Post-op Assessment: Report given to RN and Post -op Vital signs reviewed and stable  Post vital signs: Reviewed and stable  Last Vitals:  Vitals Value Taken Time  BP 140/65 06/27/2018  1:39 PM  Temp 36.6 C 06/27/2018  1:39 PM  Pulse 71 06/27/2018  1:41 PM  Resp 14 06/27/2018  1:41 PM  SpO2 100 % 06/27/2018  1:41 PM  Vitals shown include unvalidated device data.  Last Pain:  Vitals:   06/27/18 1009  TempSrc: Oral         Complications: No apparent anesthesia complications

## 2018-06-28 ENCOUNTER — Encounter (HOSPITAL_COMMUNITY): Payer: Self-pay | Admitting: Urology

## 2018-07-04 DIAGNOSIS — N390 Urinary tract infection, site not specified: Secondary | ICD-10-CM | POA: Diagnosis not present

## 2018-07-04 DIAGNOSIS — N200 Calculus of kidney: Secondary | ICD-10-CM | POA: Diagnosis not present

## 2018-07-05 DIAGNOSIS — R69 Illness, unspecified: Secondary | ICD-10-CM | POA: Diagnosis not present

## 2018-07-08 ENCOUNTER — Other Ambulatory Visit: Payer: Self-pay | Admitting: Urology

## 2018-07-08 ENCOUNTER — Encounter (HOSPITAL_COMMUNITY): Payer: Self-pay | Admitting: *Deleted

## 2018-07-08 NOTE — Progress Notes (Signed)
Patient to arrive 0630 to admitting. NPO after midnight except may take allegra, synthroid, and myrbetriq with sip of water the morning of the procedure. No  aspirin or NSAIDS until after the procedure. Patient has not received the blue folder in the mail. To fill it out the morning of the procedure.

## 2018-07-11 ENCOUNTER — Ambulatory Visit (HOSPITAL_COMMUNITY)
Admission: RE | Admit: 2018-07-11 | Discharge: 2018-07-11 | Disposition: A | Discharge status: Home / Self Care | Payer: Medicare HMO | Source: Other Acute Inpatient Hospital | Attending: Urology | Admitting: Urology

## 2018-07-11 ENCOUNTER — Other Ambulatory Visit: Payer: Self-pay

## 2018-07-11 ENCOUNTER — Ambulatory Visit (HOSPITAL_COMMUNITY): Payer: Medicare HMO

## 2018-07-11 ENCOUNTER — Encounter (HOSPITAL_COMMUNITY): Payer: Self-pay | Admitting: *Deleted

## 2018-07-11 ENCOUNTER — Encounter (HOSPITAL_COMMUNITY)
Admission: RE | Disposition: A | Discharge status: Home / Self Care | Payer: Self-pay | Source: Other Acute Inpatient Hospital | Attending: Urology

## 2018-07-11 DIAGNOSIS — N200 Calculus of kidney: Secondary | ICD-10-CM | POA: Insufficient documentation

## 2018-07-11 DIAGNOSIS — Z79899 Other long term (current) drug therapy: Secondary | ICD-10-CM | POA: Diagnosis not present

## 2018-07-11 DIAGNOSIS — Z79890 Hormone replacement therapy (postmenopausal): Secondary | ICD-10-CM | POA: Diagnosis not present

## 2018-07-11 DIAGNOSIS — N201 Calculus of ureter: Secondary | ICD-10-CM | POA: Diagnosis present

## 2018-07-11 DIAGNOSIS — Z791 Long term (current) use of non-steroidal anti-inflammatories (NSAID): Secondary | ICD-10-CM | POA: Diagnosis not present

## 2018-07-11 HISTORY — PX: EXTRACORPOREAL SHOCK WAVE LITHOTRIPSY: SHX1557

## 2018-07-11 SURGERY — LITHOTRIPSY, ESWL
Anesthesia: LOCAL | Laterality: Left

## 2018-07-11 MED ORDER — SODIUM CHLORIDE 0.9 % IV SOLN
INTRAVENOUS | Status: DC
  Administered 2018-07-11: 08:00:00 via INTRAVENOUS

## 2018-07-11 MED ORDER — DIPHENHYDRAMINE HCL 25 MG PO CAPS
25.0000 mg | ORAL_CAPSULE | ORAL | Status: AC
  Administered 2018-07-11: 25 mg via ORAL
  Filled 2018-07-11: qty 1

## 2018-07-11 MED ORDER — CIPROFLOXACIN HCL 500 MG PO TABS
500.0000 mg | ORAL_TABLET | ORAL | Status: AC
  Administered 2018-07-11: 500 mg via ORAL
  Filled 2018-07-11: qty 1

## 2018-07-11 MED ORDER — DIAZEPAM 5 MG PO TABS
10.0000 mg | ORAL_TABLET | ORAL | Status: AC
  Administered 2018-07-11: 10 mg via ORAL
  Filled 2018-07-11: qty 2

## 2018-07-12 ENCOUNTER — Encounter (HOSPITAL_COMMUNITY): Payer: Self-pay | Admitting: Urology

## 2018-07-29 DIAGNOSIS — N200 Calculus of kidney: Secondary | ICD-10-CM | POA: Diagnosis not present

## 2018-08-12 DIAGNOSIS — N200 Calculus of kidney: Secondary | ICD-10-CM | POA: Diagnosis not present

## 2018-08-20 DIAGNOSIS — C61 Malignant neoplasm of prostate: Secondary | ICD-10-CM | POA: Diagnosis not present

## 2018-08-28 DIAGNOSIS — C61 Malignant neoplasm of prostate: Secondary | ICD-10-CM | POA: Diagnosis not present

## 2018-08-28 DIAGNOSIS — N200 Calculus of kidney: Secondary | ICD-10-CM | POA: Diagnosis not present

## 2018-09-11 ENCOUNTER — Other Ambulatory Visit: Payer: Self-pay | Admitting: *Deleted

## 2018-09-11 DIAGNOSIS — C61 Malignant neoplasm of prostate: Secondary | ICD-10-CM

## 2018-09-11 MED ORDER — ENZALUTAMIDE 40 MG PO CAPS: 40.0000 mg | ORAL_CAPSULE | Freq: Every day | ORAL | 2 refills | Status: DC

## 2018-09-12 ENCOUNTER — Telehealth: Payer: Self-pay | Admitting: Pharmacist

## 2018-09-12 DIAGNOSIS — C61 Malignant neoplasm of prostate: Secondary | ICD-10-CM

## 2018-09-12 MED ORDER — ENZALUTAMIDE 40 MG PO CAPS: 40.0000 mg | ORAL_CAPSULE | Freq: Every day | ORAL | 2 refills | Status: DC

## 2018-09-12 NOTE — Telephone Encounter (Signed)
Oral Oncology Pharmacist Encounter  Received fax receipt of [facility] prescription from [facility]. Prescription appears to have been faxed to [facility] on 09/11/18.  Patient actually fills his Xtandi at [facility] with copayment support with [employer].  Prescription for Xtandi 40mg  capsules, take 1 capsule (40mg ) by mouth once daily, quantity #30, refills 2, noted that Rx is a dose decrease, e-scribed to [facility].  I called [facility] and canceled Xtandi prescription with them.  Johny Drilling, PharmD, BCPS, BCOP  09/12/2018 9:46 AM Oral Oncology Clinic [phone]

## 2018-10-04 ENCOUNTER — Inpatient Hospital Stay (HOSPITAL_BASED_OUTPATIENT_CLINIC_OR_DEPARTMENT_OTHER): Payer: Medicare HMO | Admitting: Oncology

## 2018-10-04 ENCOUNTER — Inpatient Hospital Stay: Payer: Medicare HMO | Attending: Oncology

## 2018-10-04 ENCOUNTER — Telehealth: Payer: Self-pay | Admitting: Oncology

## 2018-10-04 VITALS — BP 129/64 | HR 77 | Temp 98.6°F | Resp 17 | Ht 68.0 in | Wt 227.4 lb

## 2018-10-04 DIAGNOSIS — Z79899 Other long term (current) drug therapy: Secondary | ICD-10-CM

## 2018-10-04 DIAGNOSIS — N1330 Unspecified hydronephrosis: Secondary | ICD-10-CM

## 2018-10-04 DIAGNOSIS — C61 Malignant neoplasm of prostate: Secondary | ICD-10-CM | POA: Diagnosis not present

## 2018-10-04 DIAGNOSIS — I10 Essential (primary) hypertension: Secondary | ICD-10-CM

## 2018-10-04 LAB — CBC WITH DIFFERENTIAL (CANCER CENTER ONLY)
ABS IMMATURE GRANULOCYTES: 0.01 10*3/uL (ref 0.00–0.07)
BASOS ABS: 0 10*3/uL (ref 0.0–0.1)
Basophils Relative: 1 %
Eosinophils Absolute: 0.2 10*3/uL (ref 0.0–0.5)
Eosinophils Relative: 4 %
HEMATOCRIT: 37.9 % — AB (ref 39.0–52.0)
HEMOGLOBIN: 12.5 g/dL — AB (ref 13.0–17.0)
IMMATURE GRANULOCYTES: 0 %
LYMPHS ABS: 1.4 10*3/uL (ref 0.7–4.0)
LYMPHS PCT: 28 %
MCH: 31.6 pg (ref 26.0–34.0)
MCHC: 33 g/dL (ref 30.0–36.0)
MCV: 95.7 fL (ref 80.0–100.0)
MONOS PCT: 10 %
Monocytes Absolute: 0.5 10*3/uL (ref 0.1–1.0)
NEUTROS ABS: 3 10*3/uL (ref 1.7–7.7)
NRBC: 0 % (ref 0.0–0.2)
Neutrophils Relative %: 57 %
Platelet Count: 179 10*3/uL (ref 150–400)
RBC: 3.96 MIL/uL — ABNORMAL LOW (ref 4.22–5.81)
RDW: 13.2 % (ref 11.5–15.5)
WBC: 5.2 10*3/uL (ref 4.0–10.5)

## 2018-10-04 LAB — CMP (CANCER CENTER ONLY)
ALT: 18 U/L (ref 0–44)
AST: 18 U/L (ref 15–41)
Albumin: 4.3 g/dL (ref 3.5–5.0)
Alkaline Phosphatase: 76 U/L (ref 38–126)
Anion gap: 9 (ref 5–15)
BILIRUBIN TOTAL: 0.5 mg/dL (ref 0.3–1.2)
BUN: 28 mg/dL — AB (ref 8–23)
CO2: 26 mmol/L (ref 22–32)
CREATININE: 1.17 mg/dL (ref 0.61–1.24)
Calcium: 9.6 mg/dL (ref 8.9–10.3)
Chloride: 104 mmol/L (ref 98–111)
GFR, EST NON AFRICAN AMERICAN: 57 mL/min — AB (ref >60)
Glucose, Bld: 108 mg/dL — ABNORMAL HIGH (ref 70–99)
Potassium: 4.7 mmol/L (ref 3.5–5.1)
Sodium: 139 mmol/L (ref 135–145)
TOTAL PROTEIN: 7.3 g/dL (ref 6.5–8.1)

## 2018-10-04 NOTE — Telephone Encounter (Signed)
Printed calendar and avs. °

## 2018-10-04 NOTE — Progress Notes (Signed)
Hematology and Oncology Follow Up Visit  [name] [name] [id number] Feb 15, 1932 85 y.o. 10/04/2018 2:46 PM [name] [name], [name] [place], MD   Principle Diagnosis: 85 year old man with castration-resistant prostate cancer with pelvic adenopathy diagnosed in 2016. He was diagnosed in 1998 with a Gleason score of 9.    Prior Therapy: He was started on firmagon in October 2014.   His PSA started to rise up to 1.91 in June 2016 after a rise from 0.78 in March 2016.PSA was 0.53 in November 2015.   Xtandi 160 mg daily started in August 2016.  Therapy interrupted until March 2018 because of poor tolerance.  Current therapy:   He is receiving Lupron every 3 months.  Xtandi 40 mg daily started in July 2018.    Interim History: [name] [name] returns today for a repeat evaluation.  Since the last visit, he continues to tolerate Xtandi at the current dose without any major complications.  He denies any excessive fatigue, tiredness or nausea.  He has not tolerated Xtandi poorly at a higher dose this current dose remains tolerable.  He denies any pathological fractures or bone pain.  He still ambulates without any difficulties although rather slow.  Denies any falls or syncope.  Continues to drive otherwise.  He does not report any headaches, blurry vision, or seizures.  He denies any lethargy or dizziness.  He does not report any fevers, chills, sweats. He is not reporting any chest pain, palpitation, orthopnea.  He does not report any cough or hemoptysis or hematemesis. He does not report any nausea, vomiting, or distention.  He denies any constipation or diarrhea.  He denies any frequency urgency or hesitancy. He does not report any arthralgias or myalgias.  He does not report any bleeding or clotting tendency.  Denies any changes in mood.  Remainder review of systems is negative.  Medications: I have reviewed the patient's current medications.  Current Outpatient Medications  Medication Sig Dispense  Refill  . ciprofloxacin (CIPRO) 250 MG tablet Take 250 mg by mouth 2 (two) times daily.    . enzalutamide (XTANDI) 40 MG capsule Take 1 capsule (40 mg total) by mouth daily. 30 capsule 2  . fexofenadine (ALLEGRA) 180 MG tablet Take 180 mg by mouth daily.    . fluticasone (FLONASE) 50 MCG/ACT nasal spray Place 2 sprays into both nostrils daily as needed for allergies or rhinitis.    Marland Kitchen HYDROcodone-acetaminophen (NORCO/VICODIN) 5-325 MG tablet Take 1 tablet by mouth every 4 (four) hours as needed for moderate pain. 15 tablet 0  . levofloxacin (LEVAQUIN) 500 MG tablet Take 500 mg by mouth daily.    Marland Kitchen levothyroxine (SYNTHROID, LEVOTHROID) 150 MCG tablet Take 150 mcg by mouth daily before breakfast.    . meloxicam (MOBIC) 15 MG tablet Take 15 mg by mouth daily.    . Multiple Vitamin (MULTIVITAMIN WITH MINERALS) TABS tablet Take 1 tablet by mouth 2 (two) times a week.     Marland Kitchen MYRBETRIQ 50 MG TB24 tablet Take 50 mg by mouth daily.   0  . trimethoprim (TRIMPEX) 100 MG tablet Take 100 mg by mouth at bedtime.    . vitamin B-12 (CYANOCOBALAMIN) 1000 MCG tablet Take 1,000 mcg by mouth 2 (two) times a week.     No current facility-administered medications for this visit.      Allergies: No Known Allergies  Past Medical History, Surgical history, Social history, and Family History updated without any changes since last visit.  He denies any smoking or alcohol  use.   Physical Exam:  Blood pressure 129/64, pulse 77, temperature 98.6 F (37 C), temperature source Oral, resp. rate 17, height 5\' 8"  (1.727 m), weight 227 lb 6.4 oz (103.1 kg), SpO2 97 %.   ECOG: 1   General appearance: Alert, awake without any distress. Head: Atraumatic without abnormalities Oropharynx: Without any thrush or ulcers. Eyes: No scleral icterus. Lymph nodes: No lymphadenopathy noted in the cervical, supraclavicular, or axillary nodes Heart:regular rate and rhythm, without any murmurs or gallops.   Lung: Clear to  auscultation without any rhonchi, wheezes or dullness to percussion. Abdomin: Soft, nontender without any shifting dullness or ascites. Musculoskeletal: No clubbing or cyanosis. Neurological: No motor or sensory deficits. Skin: No rashes or lesions.   Lab Results: Lab Results  Component Value Date   WBC 5.2 10/04/2018   HGB 12.5 (L) 10/04/2018   HCT 37.9 (L) 10/04/2018   MCV 95.7 10/04/2018   PLT 179 10/04/2018     Chemistry      Component Value Date/Time   NA 143 05/31/2018 1507   NA 140 06/05/2017 1443   K 4.1 05/31/2018 1507   K 4.6 06/05/2017 1443   CL 108 05/31/2018 1507   CO2 27 05/31/2018 1507   CO2 24 06/05/2017 1443   BUN 22 05/31/2018 1507   BUN 22.7 06/05/2017 1443   CREATININE 0.92 05/31/2018 1507   CREATININE 1.2 06/05/2017 1443      Component Value Date/Time   CALCIUM 9.7 05/31/2018 1507   CALCIUM 9.4 06/05/2017 1443   ALKPHOS 77 05/31/2018 1507   ALKPHOS 66 06/05/2017 1443   AST 14 (L) 05/31/2018 1507   AST 17 06/05/2017 1443   ALT 11 05/31/2018 1507   ALT 15 06/05/2017 1443   BILITOT 0.4 05/31/2018 1507   BILITOT 0.41 06/05/2017 1443      Results for [name], [name]" (MRN [id number]) as of 10/04/2018 14:38  Ref. Range 10/02/2017 14:35 05/31/2018 15:07  Prostate Specific Ag, Serum Latest Ref Range: 0.0 - 4.0 ng/mL <0.1 <0.1        Impression and Plan:   85 year old man with:  1.  Advanced prostate cancer with pelvic adenopathy diagnosed 2016.  He was found to have castration-resistant disease at this time.  He is tolerating Xtandi at 40 mg daily with PSA remains undetectable.  Risks and benefits of continuing this therapy versus escalating the dose was discussed today.  He had poor tolerance to higher doses and refuses to take any additional treatment.  I recommended continuing his 40 mg dosing given his excellent response.     2.  Androgen deprivation: I recommended continuing Lupron indefinitely.  He is receiving this under the care  of Dr. Jeffie Pollock every 3 months.  3.  Hydronephrosis: Related to a prostate cancer and currently managed by Dr. Jeffie Pollock.  Creatinine remains stable.  4.  Hypertension: No issues related to blood pressure on Xtandi.  We will continue to monitor at this time.  5. Follow-up: Will be in 4 months.   15 minutes spent face-to-face with the patient today with greater than 50% spent was dedicated to reviewing laboratory data, disease status update and answering questions regarding future plan of care.  Zola Button, MD 1/10/20202:46 PM

## 2018-10-05 LAB — PROSTATE-SPECIFIC AG, SERUM (LABCORP): PROSTATE SPECIFIC AG, SERUM: 0.5 ng/mL (ref 0.0–4.0)

## 2018-10-30 DIAGNOSIS — M069 Rheumatoid arthritis, unspecified: Secondary | ICD-10-CM | POA: Diagnosis not present

## 2018-10-30 DIAGNOSIS — E039 Hypothyroidism, unspecified: Secondary | ICD-10-CM | POA: Diagnosis not present

## 2018-10-30 DIAGNOSIS — E669 Obesity, unspecified: Secondary | ICD-10-CM | POA: Diagnosis not present

## 2018-10-30 DIAGNOSIS — G629 Polyneuropathy, unspecified: Secondary | ICD-10-CM | POA: Diagnosis not present

## 2018-10-30 DIAGNOSIS — J309 Allergic rhinitis, unspecified: Secondary | ICD-10-CM | POA: Diagnosis not present

## 2018-10-30 DIAGNOSIS — C61 Malignant neoplasm of prostate: Secondary | ICD-10-CM | POA: Diagnosis not present

## 2018-10-30 DIAGNOSIS — C7900 Secondary malignant neoplasm of unspecified kidney and renal pelvis: Secondary | ICD-10-CM | POA: Diagnosis not present

## 2018-10-30 DIAGNOSIS — C799 Secondary malignant neoplasm of unspecified site: Secondary | ICD-10-CM | POA: Diagnosis not present

## 2018-10-30 DIAGNOSIS — G4700 Insomnia, unspecified: Secondary | ICD-10-CM | POA: Diagnosis not present

## 2018-10-30 DIAGNOSIS — Z008 Encounter for other general examination: Secondary | ICD-10-CM | POA: Diagnosis not present

## 2018-10-30 DIAGNOSIS — G8929 Other chronic pain: Secondary | ICD-10-CM | POA: Diagnosis not present

## 2018-11-28 DIAGNOSIS — C61 Malignant neoplasm of prostate: Secondary | ICD-10-CM | POA: Diagnosis not present

## 2018-12-04 DIAGNOSIS — R9721 Rising PSA following treatment for malignant neoplasm of prostate: Secondary | ICD-10-CM | POA: Diagnosis not present

## 2018-12-04 DIAGNOSIS — N393 Stress incontinence (female) (male): Secondary | ICD-10-CM | POA: Diagnosis not present

## 2018-12-04 DIAGNOSIS — N200 Calculus of kidney: Secondary | ICD-10-CM | POA: Diagnosis not present

## 2018-12-04 DIAGNOSIS — N390 Urinary tract infection, site not specified: Secondary | ICD-10-CM | POA: Diagnosis not present

## 2018-12-04 DIAGNOSIS — C775 Secondary and unspecified malignant neoplasm of intrapelvic lymph nodes: Secondary | ICD-10-CM | POA: Diagnosis not present

## 2018-12-04 DIAGNOSIS — C61 Malignant neoplasm of prostate: Secondary | ICD-10-CM | POA: Diagnosis not present

## 2019-01-10 ENCOUNTER — Other Ambulatory Visit: Payer: Self-pay

## 2019-01-10 DIAGNOSIS — C61 Malignant neoplasm of prostate: Secondary | ICD-10-CM

## 2019-01-10 MED ORDER — ENZALUTAMIDE 40 MG PO CAPS: 40.0000 mg | ORAL_CAPSULE | Freq: Every day | ORAL | 2 refills | Status: DC

## 2019-02-03 ENCOUNTER — Telehealth: Payer: Self-pay | Admitting: Oncology

## 2019-02-03 DIAGNOSIS — N393 Stress incontinence (female) (male): Secondary | ICD-10-CM | POA: Diagnosis not present

## 2019-02-03 DIAGNOSIS — M65342 Trigger finger, left ring finger: Secondary | ICD-10-CM | POA: Diagnosis not present

## 2019-02-03 DIAGNOSIS — Z1389 Encounter for screening for other disorder: Secondary | ICD-10-CM | POA: Diagnosis not present

## 2019-02-03 DIAGNOSIS — M25511 Pain in right shoulder: Secondary | ICD-10-CM | POA: Diagnosis not present

## 2019-02-03 DIAGNOSIS — N183 Chronic kidney disease, stage 3 (moderate): Secondary | ICD-10-CM | POA: Diagnosis not present

## 2019-02-03 DIAGNOSIS — Z0000 Encounter for general adult medical examination without abnormal findings: Secondary | ICD-10-CM | POA: Diagnosis not present

## 2019-02-03 DIAGNOSIS — C61 Malignant neoplasm of prostate: Secondary | ICD-10-CM | POA: Diagnosis not present

## 2019-02-03 DIAGNOSIS — D692 Other nonthrombocytopenic purpura: Secondary | ICD-10-CM | POA: Diagnosis not present

## 2019-02-03 DIAGNOSIS — E039 Hypothyroidism, unspecified: Secondary | ICD-10-CM | POA: Diagnosis not present

## 2019-02-03 NOTE — Telephone Encounter (Signed)
Per 5/11 schedule message cancel 5/12 lab and convert f/u to telephone visit. Confirmed with patient.

## 2019-02-04 ENCOUNTER — Inpatient Hospital Stay: Payer: Medicare HMO | Attending: Oncology | Admitting: Oncology

## 2019-02-04 ENCOUNTER — Inpatient Hospital Stay: Payer: Medicare HMO

## 2019-02-04 DIAGNOSIS — C61 Malignant neoplasm of prostate: Secondary | ICD-10-CM | POA: Diagnosis not present

## 2019-02-04 NOTE — Progress Notes (Signed)
Hematology and Oncology Follow Up for Telemedicine Visits  [name] [name] [id number] 08/14/1932 85 y.o. 02/04/2019 3:01 PM [name] [name], MDReade, [name], MD   I connected with [name] [name] on 02/04/19 at  3:30 PM EDT by telephone visit and verified that I am speaking with the correct person using two identifiers.   I discussed the limitations, risks, security and privacy concerns of performing an evaluation and management service by telemedicine and the availability of in-person appointments. I also discussed with the patient that there may be a patient responsible charge related to this service. The patient expressed understanding and agreed to proceed.  Other persons participating in the visit and their role in the encounter: None  Patient's location: Home Provider's location: Office   Principle Diagnosis: 85 year old man with advanced prostate cancer with pelvic adenopathy diagnosed 2016.  He has castration-resistant after initial diagnosis in 1998 with a Gleason score of 9.    Prior Therapy: He was started on firmagon in October 2014.   His PSA started to rise up to 1.91 in June 2016 after a rise from 0.78 in March 2016.PSA was 0.53 in November 2015.   Xtandi 160 mg daily started in August 2016.  Therapy interrupted until March 2018 because of poor tolerance.  Current therapy:  He is receiving Lupron every 3 months under the care of Dr. Jeffie Pollock.  Xtandi 40 mg daily started in July 2018.     Interim History: [name] [name] reports no major changes in his health.  Continues to tolerate Xtandi without any recent complaints.  He denies any recent hospitalizations or illnesses.  He reports very little back discomfort which she takes Tylenol periodically.  He denies any recent hospitalizations or illnesses.  He denies any falls or syncope.     Medications: I have reviewed the patient's current medications.  Current Outpatient Medications  Medication Sig Dispense Refill  .  ciprofloxacin (CIPRO) 250 MG tablet Take 250 mg by mouth 2 (two) times daily.    . enzalutamide (XTANDI) 40 MG capsule Take 1 capsule (40 mg total) by mouth daily. 30 capsule 2  . fexofenadine (ALLEGRA) 180 MG tablet Take 180 mg by mouth daily.    . fluticasone (FLONASE) 50 MCG/ACT nasal spray Place 2 sprays into both nostrils daily as needed for allergies or rhinitis.    Marland Kitchen HYDROcodone-acetaminophen (NORCO/VICODIN) 5-325 MG tablet Take 1 tablet by mouth every 4 (four) hours as needed for moderate pain. 15 tablet 0  . levofloxacin (LEVAQUIN) 500 MG tablet Take 500 mg by mouth daily.    Marland Kitchen levothyroxine (SYNTHROID, LEVOTHROID) 150 MCG tablet Take 150 mcg by mouth daily before breakfast.    . meloxicam (MOBIC) 15 MG tablet Take 15 mg by mouth daily.    . Multiple Vitamin (MULTIVITAMIN WITH MINERALS) TABS tablet Take 1 tablet by mouth 2 (two) times a week.     Marland Kitchen MYRBETRIQ 50 MG TB24 tablet Take 50 mg by mouth daily.   0  . trimethoprim (TRIMPEX) 100 MG tablet Take 100 mg by mouth at bedtime.    . vitamin B-12 (CYANOCOBALAMIN) 1000 MCG tablet Take 1,000 mcg by mouth 2 (two) times a week.     No current facility-administered medications for this visit.      Allergies: No Known Allergies  Past Medical History, Surgical history, Social history, and Family History were reviewed and updated.   Lab Results: Lab Results  Component Value Date   WBC 5.2 10/04/2018   HGB 12.5 (L) 10/04/2018  HCT 37.9 (L) 10/04/2018   MCV 95.7 10/04/2018   PLT 179 10/04/2018     Chemistry      Component Value Date/Time   NA 139 10/04/2018 1411   NA 140 06/05/2017 1443   K 4.7 10/04/2018 1411   K 4.6 06/05/2017 1443   CL 104 10/04/2018 1411   CO2 26 10/04/2018 1411   CO2 24 06/05/2017 1443   BUN 28 (H) 10/04/2018 1411   BUN 22.7 06/05/2017 1443   CREATININE 1.17 10/04/2018 1411   CREATININE 1.2 06/05/2017 1443      Component Value Date/Time   CALCIUM 9.6 10/04/2018 1411   CALCIUM 9.4 06/05/2017 1443    ALKPHOS 76 10/04/2018 1411   ALKPHOS 66 06/05/2017 1443   AST 18 10/04/2018 1411   AST 17 06/05/2017 1443   ALT 18 10/04/2018 1411   ALT 15 06/05/2017 1443   BILITOT 0.5 10/04/2018 1411   BILITOT 0.41 06/05/2017 1443       Impression and Plan:  85 year old man with:  1.   Castration-resistant prostate cancer with pelvic adenopathy noted in 2016.    He continues to take Xtandi at 40 mg daily without any major complications at this time.  Risks and benefits of continuing this treatment was reviewed and he is agreeable to continue.  He is asymptomatic from his disease even with a slow rise in his PSA no need for treatment is indicated unless he has symptomatic progression.     2.  Androgen deprivation: He continues to receive that under the care of Dr. Jeffie Pollock.  I recommended continuing this indefinitely.  3.  Renal insufficiency: His creatinine in January 2020 was within normal range.  He will follow-up with Dr. Jeffie Pollock regarding this issue in the future given his history of hydronephrosis.  He is also been instructed to avoid nonsteroidal anti-inflammatories.  4. Follow-up: Will be in 4 months.    I discussed the assessment and treatment plan with the patient. The patient was provided an opportunity to ask questions and all were answered. The patient agreed with the plan and demonstrated an understanding of the instructions.   The patient was advised to call back or seek an in-person evaluation if the symptoms worsen or if the condition fails to improve as anticipated.  I provided 20 minutes of non face-to-face telephone visit time during this encounter, and > 50% was dedicated to reviewing his disease status, treatment options and complications related therapy.  Zola Button, MD 02/04/2019 3:01 PM

## 2019-02-05 ENCOUNTER — Telehealth: Payer: Self-pay | Admitting: Oncology

## 2019-02-05 NOTE — Telephone Encounter (Signed)
Called regarding schedule °

## 2019-02-26 DIAGNOSIS — C61 Malignant neoplasm of prostate: Secondary | ICD-10-CM | POA: Diagnosis not present

## 2019-03-05 DIAGNOSIS — N200 Calculus of kidney: Secondary | ICD-10-CM | POA: Diagnosis not present

## 2019-03-05 DIAGNOSIS — N393 Stress incontinence (female) (male): Secondary | ICD-10-CM | POA: Diagnosis not present

## 2019-03-05 DIAGNOSIS — C61 Malignant neoplasm of prostate: Secondary | ICD-10-CM | POA: Diagnosis not present

## 2019-03-05 DIAGNOSIS — Z5111 Encounter for antineoplastic chemotherapy: Secondary | ICD-10-CM | POA: Diagnosis not present

## 2019-03-06 ENCOUNTER — Other Ambulatory Visit (HOSPITAL_COMMUNITY): Payer: Self-pay | Admitting: Urology

## 2019-03-06 DIAGNOSIS — C61 Malignant neoplasm of prostate: Secondary | ICD-10-CM

## 2019-03-11 ENCOUNTER — Telehealth: Payer: Self-pay | Admitting: Pharmacy Technician

## 2019-03-11 NOTE — Telephone Encounter (Signed)
Oral Oncology Patient Advocate Encounter:  Spoke to patient and discussed his situation and how there are no grants open at this time. He is ok with getting enrolled back with Manufacturer Assistance. Started Museum/gallery curator on the [employer]. Will need to print documents and then obtain provider signature. Once submitted to the program, patient can call in to give verbal consent. Advised patient that we will be reaching out to let him know when to call and to provide the phone number.  Phone# [phone] Case# [id number]  4:17 PM Beatriz Chancellor, CPhT

## 2019-03-11 NOTE — Telephone Encounter (Signed)
Oral Oncology Patient Advocate Encounter:  Received message from patient in regards to a letter he received from the patient assist network prostate cancer fund and it sounds like the grant assist is ending 03/26/2019. He has Xtandi tabs now and next shipment is scheduled for this week but he is concerned about the payment moving forward .    Appears patient was enrolled in [place] [facility] in the past, but decided to stop due to the inconvenience of having to wait and sign for shipments from [facility]. There are currently no Prostate grants open at this time, so we will need to re-enroll for Manufacturer Assistance again.  Attempted to call patient to discuss, had to leave a message.  2:37 PM Beatriz Chancellor, CPhT

## 2019-03-13 NOTE — Telephone Encounter (Signed)
Oral Oncology Patient Advocate Encounter:  [facility] [employer] to confirm they received all required documentation. Rep Felicita Gage, confirmed and stated that they just are waiting on patient consent. Advised we will have patient call.  Called patient and provided phone number to call to give patient consent. Advised patient that we will keep him updated. Patient had no questions or concerns at this time. Will need to follow up to confirm consent is given and to check status.  Phone# [phone]  9:59 AM Beatriz Chancellor, CPhT

## 2019-03-14 NOTE — Telephone Encounter (Signed)
Oral Oncology Patient Advocate Encounter  I followed up with [employer] support solutions on the patient application.  The application is in the final stages of processing and should have a decision in 24-48 hours.  This encounter will continue to be updated until final determination.  [name] [name] Patient [place] Phone [phone] Fax [phone] 03/14/2019   10:41 AM

## 2019-03-18 NOTE — Telephone Encounter (Signed)
Oral Oncology Patient Advocate Encounter  I called [employer] to follow up on the patient assistance application.  The application is still in the final processing stage and should have a decision by the end of the business day today.  I called the patient and gave him this update, he verbalized understanding and great appreciation.  [place] Patient [place] Phone [phone] Fax [phone] 03/18/2019   10:39 AM

## 2019-03-19 NOTE — Telephone Encounter (Signed)
Oral Oncology Patient Advocate Encounter  Received notification from [facility] Patient Assistance program that patient has been successfully enrolled into their program to receive Xtandi from the manufacturer at $0 out of pocket until 09/25/19.    I called and spoke with patient.  He knows we will have to re-apply.   Patient knows to call the office with questions or concerns.   Oral Oncology Clinic will continue to follow.  [place] Patient [place] Phone [phone] Fax [phone] 03/19/2019    4:06 PM

## 2019-03-20 ENCOUNTER — Encounter (HOSPITAL_COMMUNITY)
Admission: RE | Admit: 2019-03-20 | Discharge: 2019-03-20 | Disposition: A | Discharge status: Home / Self Care | Payer: Medicare HMO | Source: Ambulatory Visit | Attending: Urology | Admitting: Urology

## 2019-03-20 ENCOUNTER — Ambulatory Visit (HOSPITAL_COMMUNITY)
Admission: RE | Admit: 2019-03-20 | Discharge: 2019-03-20 | Disposition: A | Discharge status: Home / Self Care | Payer: Medicare HMO | Source: Ambulatory Visit | Attending: Urology | Admitting: Urology

## 2019-03-20 ENCOUNTER — Other Ambulatory Visit (HOSPITAL_COMMUNITY): Payer: Self-pay | Admitting: Urology

## 2019-03-20 ENCOUNTER — Other Ambulatory Visit: Payer: Self-pay

## 2019-03-20 DIAGNOSIS — I700 Atherosclerosis of aorta: Secondary | ICD-10-CM | POA: Diagnosis not present

## 2019-03-20 DIAGNOSIS — C61 Malignant neoplasm of prostate: Secondary | ICD-10-CM | POA: Insufficient documentation

## 2019-03-20 DIAGNOSIS — S2242XA Multiple fractures of ribs, left side, initial encounter for closed fracture: Secondary | ICD-10-CM | POA: Diagnosis not present

## 2019-03-20 DIAGNOSIS — C259 Malignant neoplasm of pancreas, unspecified: Secondary | ICD-10-CM | POA: Diagnosis not present

## 2019-03-20 DIAGNOSIS — Z192 Hormone resistant malignancy status: Secondary | ICD-10-CM | POA: Diagnosis not present

## 2019-03-20 DIAGNOSIS — Z96641 Presence of right artificial hip joint: Secondary | ICD-10-CM | POA: Diagnosis not present

## 2019-03-20 DIAGNOSIS — N202 Calculus of kidney with calculus of ureter: Secondary | ICD-10-CM | POA: Diagnosis not present

## 2019-03-20 MED ORDER — TECHNETIUM TC 99M MEDRONATE IV KIT
20.0000 | PACK | Freq: Once | INTRAVENOUS | Status: AC | PRN
  Administered 2019-03-20: 21.8 via INTRAVENOUS

## 2019-03-31 NOTE — Telephone Encounter (Signed)
Oral Oncology Pharmacist Encounter  Received call from [facility] support solutions that 1st shipment of Xtandi shipped from the pharmacy on Friday 03/28/19 and is planned to be delivered to patient's mailing address today, 03/31/2019.  Johny Drilling, PharmD, BCPS, BCOP  03/31/2019 8:45 AM Oral Oncology Clinic [phone]

## 2019-04-01 ENCOUNTER — Other Ambulatory Visit: Payer: Self-pay | Admitting: *Deleted

## 2019-04-01 DIAGNOSIS — C61 Malignant neoplasm of prostate: Secondary | ICD-10-CM

## 2019-04-01 MED ORDER — XTANDI 40 MG PO CAPS: 40.0000 mg | ORAL_CAPSULE | Freq: Every day | ORAL | 2 refills | Status: DC

## 2019-04-14 ENCOUNTER — Telehealth: Payer: Self-pay

## 2019-04-14 ENCOUNTER — Telehealth: Payer: Self-pay | Admitting: Pharmacist

## 2019-04-14 DIAGNOSIS — C61 Malignant neoplasm of prostate: Secondary | ICD-10-CM

## 2019-04-14 MED ORDER — XTANDI 40 MG PO CAPS: 40.0000 mg | ORAL_CAPSULE | Freq: Every day | ORAL | 2 refills | Status: DC

## 2019-04-14 NOTE — Telephone Encounter (Signed)
Oral Oncology Patient Advocate Encounter  The patient is currently enrolled into the [facility] patient assistance program. I received a fax from [place] that the patient has been approved for a prostate cancer grant. I looked into this and a new Xtandi prescription was sent to [facility] on 04/01/19. [employer] secured the grant for the patient. I called the patient to discuss this with him. He has not received the medicine from [facility] and was not aware of a grant. The patient would rather use the grant than patient assistance because the delivery with [facility] patient assistance is not convenient.   I discussed with the patient the option to get his prescription from [place] and use this new grant to cover the copay to make his out of pocket cost $0. The patient is very excited to hear that this can be done and would like to get the [place] at [facility]. He does have some medicine on hand but we can plan to ship the medicine to him on August 11th and he is agreeable to this plan.  I have called [employer] and cancelled the recent prescription. I also called [employer] and cancelled the current enrollment with the patient assistance program.   We will need a new prescription sent to [place].  The [employer] amount is $7300, this will make his [place] Phy out of pocket cost $0. The grant information is as follows and has been shared with [place].  BIN: [id number] PCN: [id number] Grp: [id number] ID: [id number] Approval dates: 03/27/19-03/25/20  This patient verbalized understanding and great appreciation.  [place] Patient [place] Phone [phone] Fax [phone] 04/14/2019   9:42 AM

## 2019-04-14 NOTE — Telephone Encounter (Signed)
Oral Chemotherapy Pharmacist Encounter   Spoke with patient today to follow up regarding patient's oral chemotherapy medication: Xtandi (enzalutamide) for the treatment of metastatic, castration-resistant prostate cancer in conjunction with androgen deprivation therapy, planned duration until disease progression or unacceptable toxicity.  Original Start date of oral chemotherapy: 05/05/2015  Patient was originally started on Xatndi 40 mg capsules, 4 capsules (160 mg) by mouth once daily. Dose has been subsequently decreased to 40 mg once daily starting in July 2018.  Pt reports 0 doses of Xtandi 40 mg capsules, 1 capsule by mouth once daily, missed in the last month.  Patient takes his Xtandi between 5-6pm daily.  Pt reports the following side effects: manageable fatigue, intolerable at higher doses.  Pertinent labs reviewed: OK for continued treatment. Patient states his last PSA checked at [facility] in June 2020 is ~1.5 (these records are not yet available in Epic), and this is increasing with each check. Patient states Dr. Jeffie Pollock would like patient to increase Xtandi daily dose but patient is not agreeable to this at this time, but would consider it further if directed to do so by Dr. Alen Blew. Next office visit at [facility] 06/13/2019  Other Issues:  Patient with new copayment grant secured from Patient [facility]). He will start receiving his Xtandi from the [employer]. New prescription sent to the pharmacy this morning.  Patient provided direct dial to oral oncology clinic and he knows to call the office with questions or concerns.  [name] [name], PharmD, BCPS, BCOP  04/14/2019 10:01 AM Oral Oncology Clinic [phone]

## 2019-05-06 MED FILL — XTANDI 40 MG CAPSULE: 40 | 30 days supply | Qty: 30 | Fill #0

## 2019-05-06 NOTE — Telephone Encounter (Signed)
Oral Oncology Patient Advocate Encounter  The patient called stating that he received a call from [place] support solutions to refill his Xtandi. I called [place] to confirm that the previous application had been cancelled. The representative stated that it has been cancelled and she would relay that information to [employer] so they do not call to schedule refills.  [place] Patient [place] Phone [phone] Fax [phone] 05/06/2019   1:48 PM

## 2019-05-26 DIAGNOSIS — C61 Malignant neoplasm of prostate: Secondary | ICD-10-CM | POA: Diagnosis not present

## 2019-06-04 DIAGNOSIS — N200 Calculus of kidney: Secondary | ICD-10-CM | POA: Diagnosis not present

## 2019-06-04 DIAGNOSIS — R9721 Rising PSA following treatment for malignant neoplasm of prostate: Secondary | ICD-10-CM | POA: Diagnosis not present

## 2019-06-04 DIAGNOSIS — N393 Stress incontinence (female) (male): Secondary | ICD-10-CM | POA: Diagnosis not present

## 2019-06-04 DIAGNOSIS — N390 Urinary tract infection, site not specified: Secondary | ICD-10-CM | POA: Diagnosis not present

## 2019-06-04 DIAGNOSIS — C61 Malignant neoplasm of prostate: Secondary | ICD-10-CM | POA: Diagnosis not present

## 2019-06-05 MED FILL — XTANDI 40 MG CAPSULE: 40 | 30 days supply | Qty: 30 | Fill #1

## 2019-06-13 ENCOUNTER — Inpatient Hospital Stay: Payer: Medicare HMO

## 2019-06-13 ENCOUNTER — Inpatient Hospital Stay: Payer: Medicare HMO | Attending: Oncology | Admitting: Oncology

## 2019-06-13 ENCOUNTER — Other Ambulatory Visit: Payer: Self-pay

## 2019-06-13 VITALS — BP 140/80 | HR 76 | Temp 98.2°F | Resp 17 | Ht 68.0 in | Wt 238.7 lb

## 2019-06-13 DIAGNOSIS — C61 Malignant neoplasm of prostate: Secondary | ICD-10-CM | POA: Insufficient documentation

## 2019-06-13 DIAGNOSIS — I10 Essential (primary) hypertension: Secondary | ICD-10-CM | POA: Insufficient documentation

## 2019-06-13 DIAGNOSIS — Z791 Long term (current) use of non-steroidal anti-inflammatories (NSAID): Secondary | ICD-10-CM | POA: Diagnosis not present

## 2019-06-13 DIAGNOSIS — K869 Disease of pancreas, unspecified: Secondary | ICD-10-CM | POA: Diagnosis not present

## 2019-06-13 DIAGNOSIS — Z79899 Other long term (current) drug therapy: Secondary | ICD-10-CM | POA: Insufficient documentation

## 2019-06-13 LAB — CBC WITH DIFFERENTIAL (CANCER CENTER ONLY)
Abs Immature Granulocytes: 0.01 10*3/uL (ref 0.00–0.07)
Basophils Absolute: 0 10*3/uL (ref 0.0–0.1)
Basophils Relative: 0 %
Eosinophils Absolute: 0.2 10*3/uL (ref 0.0–0.5)
Eosinophils Relative: 4 %
HCT: 38 % — ABNORMAL LOW (ref 39.0–52.0)
Hemoglobin: 12.7 g/dL — ABNORMAL LOW (ref 13.0–17.0)
Immature Granulocytes: 0 %
Lymphocytes Relative: 30 %
Lymphs Abs: 1.6 10*3/uL (ref 0.7–4.0)
MCH: 32.8 pg (ref 26.0–34.0)
MCHC: 33.4 g/dL (ref 30.0–36.0)
MCV: 98.2 fL (ref 80.0–100.0)
Monocytes Absolute: 0.6 10*3/uL (ref 0.1–1.0)
Monocytes Relative: 11 %
Neutro Abs: 2.8 10*3/uL (ref 1.7–7.7)
Neutrophils Relative %: 55 %
Platelet Count: 154 10*3/uL (ref 150–400)
RBC: 3.87 MIL/uL — ABNORMAL LOW (ref 4.22–5.81)
RDW: 13.1 % (ref 11.5–15.5)
WBC Count: 5.2 10*3/uL (ref 4.0–10.5)
nRBC: 0 % (ref 0.0–0.2)

## 2019-06-13 LAB — CMP (CANCER CENTER ONLY)
ALT: 15 U/L (ref 0–44)
AST: 18 U/L (ref 15–41)
Albumin: 4.4 g/dL (ref 3.5–5.0)
Alkaline Phosphatase: 59 U/L (ref 38–126)
Anion gap: 9 (ref 5–15)
BUN: 24 mg/dL — ABNORMAL HIGH (ref 8–23)
CO2: 28 mmol/L (ref 22–32)
Calcium: 9.6 mg/dL (ref 8.9–10.3)
Chloride: 104 mmol/L (ref 98–111)
Creatinine: 1.32 mg/dL — ABNORMAL HIGH (ref 0.61–1.24)
GFR, Est AFR Am: 57 mL/min — ABNORMAL LOW (ref >60)
GFR, Estimated: 49 mL/min — ABNORMAL LOW (ref >60)
Glucose, Bld: 110 mg/dL — ABNORMAL HIGH (ref 70–99)
Potassium: 4.6 mmol/L (ref 3.5–5.1)
Sodium: 141 mmol/L (ref 135–145)
Total Bilirubin: 0.6 mg/dL (ref 0.3–1.2)
Total Protein: 6.8 g/dL (ref 6.5–8.1)

## 2019-06-13 NOTE — Progress Notes (Signed)
Hematology and Oncology Follow Up Visit  [name] [name] [id number] 1932-05-04 85 y.o. 06/13/2019 9:50 AM [name] [name], [name] [place], MD   Principle Diagnosis: 85 year old man with advanced prostate cancer presenting with adenopathy in 2016.  He has castration-resistant after initial diagnosis with Gleason score of 9 in 1998    Prior Therapy: He was started on firmagon in October 2014.   His PSA started to rise up to 1.91 in June 2016 after a rise from 0.78 in March 2016.PSA was 0.53 in November 2015.   Xtandi 160 mg daily started in August 2016.  Therapy interrupted until March 2018 because of poor tolerance.  Current therapy:    He is receiving Lupron every 3 months under the care of Dr. Jeffie Pollock.  Xtandi 40 mg daily started in July 2018.    Interim History: [name] [name] returns today for a follow-up.  Since last visit, he reports no major changes in his health.  He continues to tolerate Xtandi without any major complications.  He does report mild lower extremity edema but no recent pain or discomfort.  He ambulates short distances but overall his performance status has been limited.  He denies any abdominal pain discomfort.  His appetite remain excellent and has gained weight.  He denied any alteration mental status, neuropathy, confusion or dizziness.  Denies any headaches or lethargy.  Denies any night sweats, weight loss or changes in appetite.  Denied orthopnea, dyspnea on exertion or chest discomfort.  Denies shortness of breath, difficulty breathing hemoptysis or cough.  Denies any abdominal distention, nausea, early satiety or dyspepsia.  Denies any hematuria, frequency, dysuria or nocturia.  Denies any skin irritation, dryness or rash.  Denies any ecchymosis or petechiae.  Denies any lymphadenopathy or clotting.  Denies any heat or cold intolerance.  Denies any anxiety or depression.  Remaining review of system is negative.     Medications: Updated on review.  Current  Outpatient Medications  Medication Sig Dispense Refill  . ciprofloxacin (CIPRO) 250 MG tablet Take 250 mg by mouth 2 (two) times daily.    . enzalutamide (XTANDI) 40 MG capsule Take 1 capsule (40 mg total) by mouth daily. 30 capsule 2  . fexofenadine (ALLEGRA) 180 MG tablet Take 180 mg by mouth daily.    . fluticasone (FLONASE) 50 MCG/ACT nasal spray Place 2 sprays into both nostrils daily as needed for allergies or rhinitis.    Marland Kitchen HYDROcodone-acetaminophen (NORCO/VICODIN) 5-325 MG tablet Take 1 tablet by mouth every 4 (four) hours as needed for moderate pain. 15 tablet 0  . levofloxacin (LEVAQUIN) 500 MG tablet Take 500 mg by mouth daily.    Marland Kitchen levothyroxine (SYNTHROID, LEVOTHROID) 150 MCG tablet Take 150 mcg by mouth daily before breakfast.    . meloxicam (MOBIC) 15 MG tablet Take 15 mg by mouth daily.    . Multiple Vitamin (MULTIVITAMIN WITH MINERALS) TABS tablet Take 1 tablet by mouth 2 (two) times a week.     Marland Kitchen MYRBETRIQ 50 MG TB24 tablet Take 50 mg by mouth daily.   0  . trimethoprim (TRIMPEX) 100 MG tablet Take 100 mg by mouth at bedtime.    . vitamin B-12 (CYANOCOBALAMIN) 1000 MCG tablet Take 1,000 mcg by mouth 2 (two) times a week.     No current facility-administered medications for this visit.      Allergies: No Known Allergies  Past Medical History, Surgical history, Social history, and Family History remains without any change in review.   Physical Exam:  Blood  pressure 140/80, pulse 76, temperature 98.2 F (36.8 C), temperature source Oral, resp. rate 17, height 5\' 8"  (1.727 m), weight 238 lb 11.2 oz (108.3 kg), SpO2 95 %.    ECOG: 1    General appearance: Comfortable appearing without any discomfort Head: Normocephalic without any trauma Oropharynx: Mucous membranes are moist and pink without any thrush or ulcers. Eyes: Pupils are equal and round reactive to light. Lymph nodes: No cervical, supraclavicular, inguinal or axillary lymphadenopathy.   Heart:regular  rate and rhythm.  S1 and S2 with a bilateral ankle edema. Lung: Clear without any rhonchi or wheezes.  No dullness to percussion. Abdomin: Soft, nontender, nondistended with good bowel sounds.  No hepatosplenomegaly. Musculoskeletal: No joint deformity or effusion.  Full range of motion noted. Neurological: No deficits noted on motor, sensory and deep tendon reflex exam. Skin: No petechial rash or dryness.  Appeared moist.     Lab Results: Lab Results  Component Value Date   WBC 5.2 10/04/2018   HGB 12.5 (L) 10/04/2018   HCT 37.9 (L) 10/04/2018   MCV 95.7 10/04/2018   PLT 179 10/04/2018     Chemistry      Component Value Date/Time   NA 139 10/04/2018 1411   NA 140 06/05/2017 1443   K 4.7 10/04/2018 1411   K 4.6 06/05/2017 1443   CL 104 10/04/2018 1411   CO2 26 10/04/2018 1411   CO2 24 06/05/2017 1443   BUN 28 (H) 10/04/2018 1411   BUN 22.7 06/05/2017 1443   CREATININE 1.17 10/04/2018 1411   CREATININE 1.2 06/05/2017 1443      Component Value Date/Time   CALCIUM 9.6 10/04/2018 1411   CALCIUM 9.4 06/05/2017 1443   ALKPHOS 76 10/04/2018 1411   ALKPHOS 66 06/05/2017 1443   AST 18 10/04/2018 1411   AST 17 06/05/2017 1443   ALT 18 10/04/2018 1411   ALT 15 06/05/2017 1443   BILITOT 0.5 10/04/2018 1411   BILITOT 0.41 06/05/2017 1443        Results for [name] [name], [name] "BILL" (MRN [id number]) as of 06/13/2019 09:05  Ref. Range 05/31/2018 15:07 10/04/2018 14:11  Prostate Specific Ag, Serum Latest Ref Range: 0.0 - 4.0 ng/mL <0.1 0.5    IMPRESSION: No scintigraphic evidence of osseous metastatic disease.  Chronic uptake at T8-T9 corresponding to degenerative disc disease changes and T8 compression fracture identified by CT.  Increased uptake at degenerative disc disease changes at LEFT L3-L4.   Impression and Plan:   85 year old man with:  1.  Castration-resistant prostate cancer with pelvic adenopathy diagnosed in 2016.   He continues to be on Xtandi and has  benefited for an extended period of time with excellent PSA response.  His PSA is slowly rising at this time with a bone scan obtained on 03/20/2019 did not show any evidence of metastatic disease.  CT scan of the abdomen and pelvis did not show any lymphadenopathy or evidence of cancer progression.  The natural course of this disease was discussed today and the treatment options were reviewed.  He understands that he has an incurable malignancy and is only willing to change treatment at this time if he has symptoms from his disease.  Given the fact that he has no measurable disease at this time we have opted to continue Xtandi at the same dose and schedule.  Increasing the dose or switching to different treatment was also reviewed but he deferred this option for the time being.     2.  Androgen  deprivation: He continues to receive that under the care of Dr. Jeffie Pollock without any new complications.  3.  Tail of pancreas lesion: This was detected on a CT scan in June 2020.  He refused further work-up including MRI at this time.  He would not be a candidate for pancreatic surgery and has deferred any further evaluation for the time being.  I recommended maybe repeat imaging studies in the next 6 months.  We will discuss that with him further in the future.  4.  Hypertension: His blood pressure remains adequate on Xtandi.  5.  Prognosis and goals of care: He has an incurable malignancy and any treatment is palliative at this time.  Given his age and comorbid conditions, he would not be a candidate for any aggressive measures.  He is in favor of continuing less invasive approach at this time.  6. Follow-up: In 4 months for repeat evaluation.  25 minutes spent face-to-face with the patient today with greater than 50% was dedicated to reviewing imaging studies, differential diagnosis and management options for the future.  Zola Button, MD 9/18/20209:50 AM

## 2019-06-14 LAB — PROSTATE-SPECIFIC AG, SERUM (LABCORP): Prostate Specific Ag, Serum: 4.9 ng/mL — ABNORMAL HIGH (ref 0.0–4.0)

## 2019-06-16 ENCOUNTER — Telehealth: Payer: Self-pay | Admitting: Oncology

## 2019-06-16 NOTE — Telephone Encounter (Signed)
No los nor sch message per 9/18. °

## 2019-07-01 DIAGNOSIS — R69 Illness, unspecified: Secondary | ICD-10-CM | POA: Diagnosis not present

## 2019-07-10 MED FILL — XTANDI 40 MG CAPSULE: 40 | 30 days supply | Qty: 30 | Fill #2

## 2019-07-28 ENCOUNTER — Other Ambulatory Visit: Payer: Self-pay

## 2019-07-28 ENCOUNTER — Other Ambulatory Visit: Payer: Self-pay | Admitting: Oncology

## 2019-07-28 DIAGNOSIS — C61 Malignant neoplasm of prostate: Secondary | ICD-10-CM

## 2019-08-05 ENCOUNTER — Telehealth: Payer: Self-pay | Admitting: Oncology

## 2019-08-05 NOTE — Telephone Encounter (Signed)
Scheduled appt per 11/10 sch message - mailed reminder letter with appt date and time   

## 2019-08-11 MED FILL — XTANDI 40 MG CAPSULE: 40 | 30 days supply | Qty: 30 | Fill #0

## 2019-08-27 DIAGNOSIS — C61 Malignant neoplasm of prostate: Secondary | ICD-10-CM | POA: Diagnosis not present

## 2019-09-01 IMAGING — NM NM BONE WHOLE BODY
2 series · 2 of 2 positions shown · non-contrast
Comparison: Whole-body bone scan 04/19/2016. PET CT 11/14/2016. CT
abdomen and pelvis 06/10/2018.

CLINICAL DATA: Prostate cancer

EXAM:
NUCLEAR MEDICINE WHOLE BODY BONE SCAN
TECHNIQUE: Whole body anterior and posterior images were obtained approximately
3 hours after intravenous injection of radiopharmaceutical.
RADIOPHARMACEUTICALS:  21.3 mCi 6echnetium-WWm MDP IV

[Series 1: whole body · 2.66mm/px · 1 of 1 slices shown (1 of 2)]
[im 1/1]
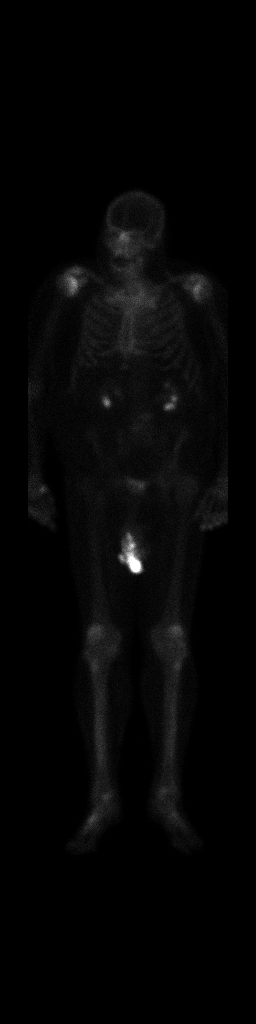

[Series 1: whole body · 2.66mm/px · 1 of 1 slices shown (2 of 2)]
[im 1/1]
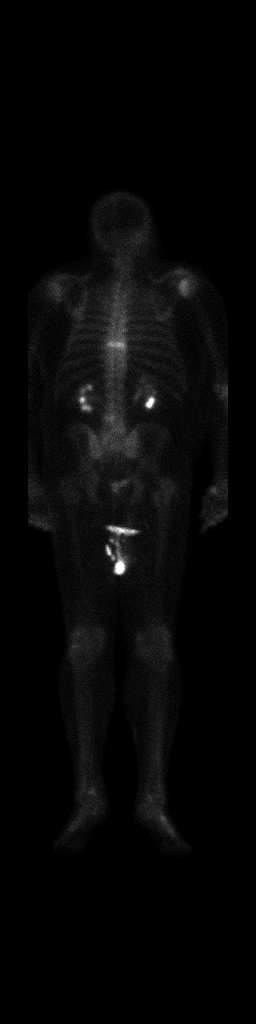

[2 of 2 positions shown; findings below may reference images not displayed]

FINDINGS: Persistent horizontal increased uptake noted at the T8-9 level,
stable since prior study compatible with known compression fracture.
Degenerative uptake over the lower lumbar spine is also stable. No
new suspicious bony uptake to suggest osseous metastatic disease.
IMPRESSION: Persistent and stable linear uptake in the T8-9 region compatible
with known compression fracture. No suspicious uptake to suggest
osseous metastatic disease.

## 2019-09-03 DIAGNOSIS — Z87442 Personal history of urinary calculi: Secondary | ICD-10-CM | POA: Diagnosis not present

## 2019-09-03 DIAGNOSIS — N393 Stress incontinence (female) (male): Secondary | ICD-10-CM | POA: Diagnosis not present

## 2019-09-03 DIAGNOSIS — C775 Secondary and unspecified malignant neoplasm of intrapelvic lymph nodes: Secondary | ICD-10-CM | POA: Diagnosis not present

## 2019-09-03 DIAGNOSIS — Z192 Hormone resistant malignancy status: Secondary | ICD-10-CM | POA: Diagnosis not present

## 2019-09-03 DIAGNOSIS — R310 Gross hematuria: Secondary | ICD-10-CM | POA: Diagnosis not present

## 2019-09-03 DIAGNOSIS — C61 Malignant neoplasm of prostate: Secondary | ICD-10-CM | POA: Diagnosis not present

## 2019-09-04 ENCOUNTER — Other Ambulatory Visit: Payer: Self-pay | Admitting: Urology

## 2019-09-04 ENCOUNTER — Other Ambulatory Visit (HOSPITAL_COMMUNITY): Payer: Self-pay | Admitting: Urology

## 2019-09-04 DIAGNOSIS — R9721 Rising PSA following treatment for malignant neoplasm of prostate: Secondary | ICD-10-CM

## 2019-09-04 DIAGNOSIS — C61 Malignant neoplasm of prostate: Secondary | ICD-10-CM

## 2019-09-08 MED FILL — XTANDI 40 MG CAPSULE: 40 | 30 days supply | Qty: 30 | Fill #1

## 2019-09-22 ENCOUNTER — Other Ambulatory Visit: Payer: Self-pay

## 2019-09-22 DIAGNOSIS — C61 Malignant neoplasm of prostate: Secondary | ICD-10-CM

## 2019-09-22 MED ORDER — XTANDI 40 MG PO CAPS: 40.0000 mg | ORAL_CAPSULE | Freq: Every day | ORAL | 2 refills | Status: DC

## 2019-09-23 ENCOUNTER — Other Ambulatory Visit: Payer: Self-pay

## 2019-09-23 ENCOUNTER — Other Ambulatory Visit: Payer: Self-pay | Admitting: Pharmacist

## 2019-09-23 ENCOUNTER — Encounter (HOSPITAL_COMMUNITY)
Admission: RE | Admit: 2019-09-23 | Discharge: 2019-09-23 | Disposition: A | Discharge status: Home / Self Care | Payer: Medicare HMO | Source: Ambulatory Visit | Attending: Urology | Admitting: Urology

## 2019-09-23 DIAGNOSIS — C61 Malignant neoplasm of prostate: Secondary | ICD-10-CM | POA: Insufficient documentation

## 2019-09-23 DIAGNOSIS — N132 Hydronephrosis with renal and ureteral calculous obstruction: Secondary | ICD-10-CM | POA: Diagnosis not present

## 2019-09-23 DIAGNOSIS — R9721 Rising PSA following treatment for malignant neoplasm of prostate: Secondary | ICD-10-CM

## 2019-09-23 DIAGNOSIS — Z192 Hormone resistant malignancy status: Secondary | ICD-10-CM | POA: Diagnosis not present

## 2019-09-23 MED ORDER — TECHNETIUM TC 99M MEDRONATE IV KIT: 21.5000 | PACK | Freq: Once | INTRAVENOUS | Status: DC

## 2019-09-23 MED ORDER — XTANDI 40 MG PO CAPS: 80.0000 mg | ORAL_CAPSULE | Freq: Every day | ORAL | 2 refills | Status: DC

## 2019-09-23 MED FILL — XTANDI 40 MG CAPSULE: 40 | 30 days supply | Qty: 60 | Fill #0

## 2019-09-23 NOTE — Progress Notes (Signed)
Oral Chemotherapy Pharmacist Encounter   Patient's wife reported an Xtandi dose increase to [place]. Spoke with Dr. Alen Blew and he instructed the patient to increase to 80mg  daily until he see him at his next appt. Prescription to reflect this dose increase sent to [place].  Darl Pikes, PharmD, BCPS, [facility] Hematology/Oncology Clinical Pharmacist [facility] [phone]  09/23/2019 12:23 PM

## 2019-10-02 IMAGING — CR DG ABDOMEN 1V
2 series · 2 of 2 positions shown · non-contrast
Comparison: 07/04/2018.

CLINICAL DATA: Lithotripsy.

EXAM:
ABDOMEN - 1 VIEW

[t abdomen supine * (1 of 2)]
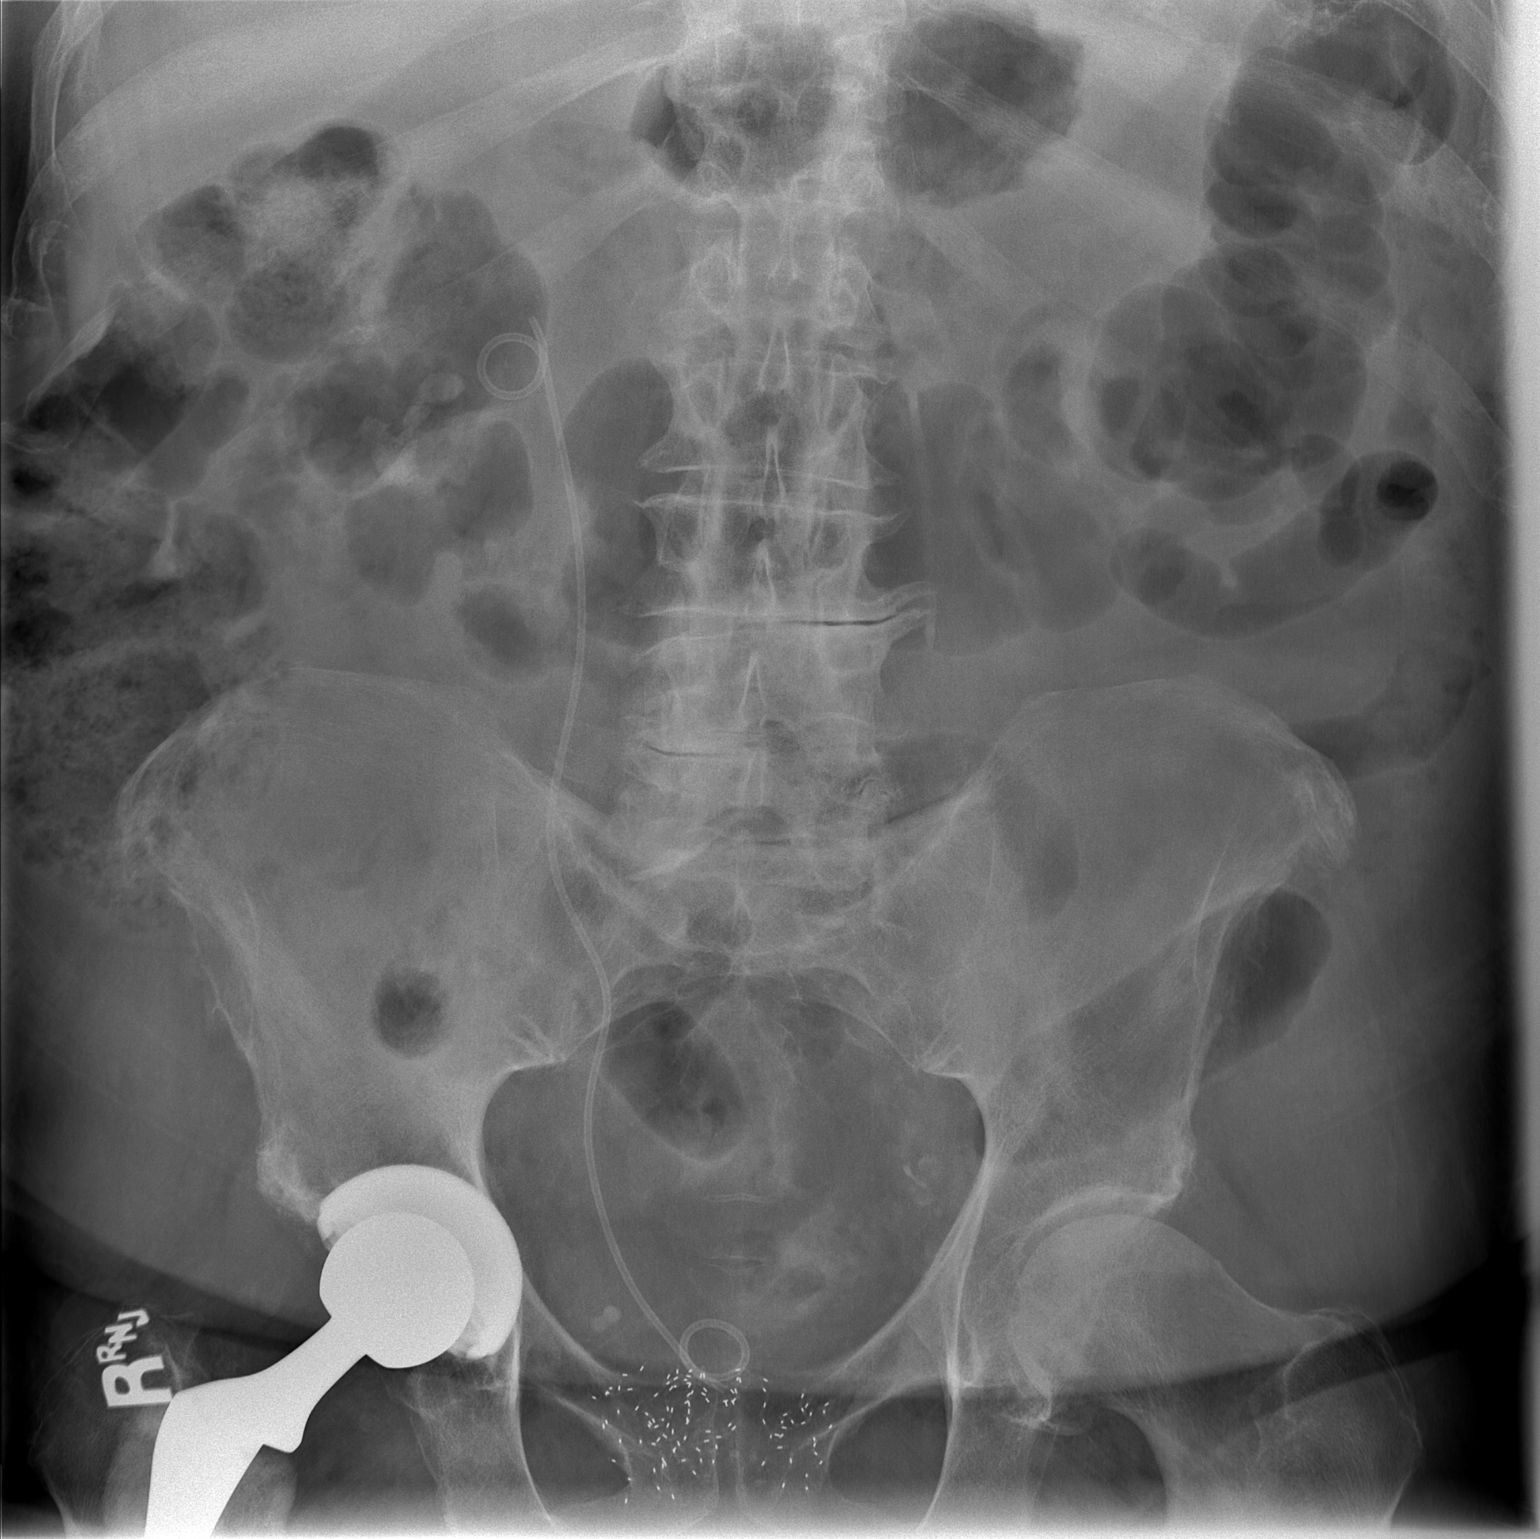

[t abdomen supine * (2 of 2)]
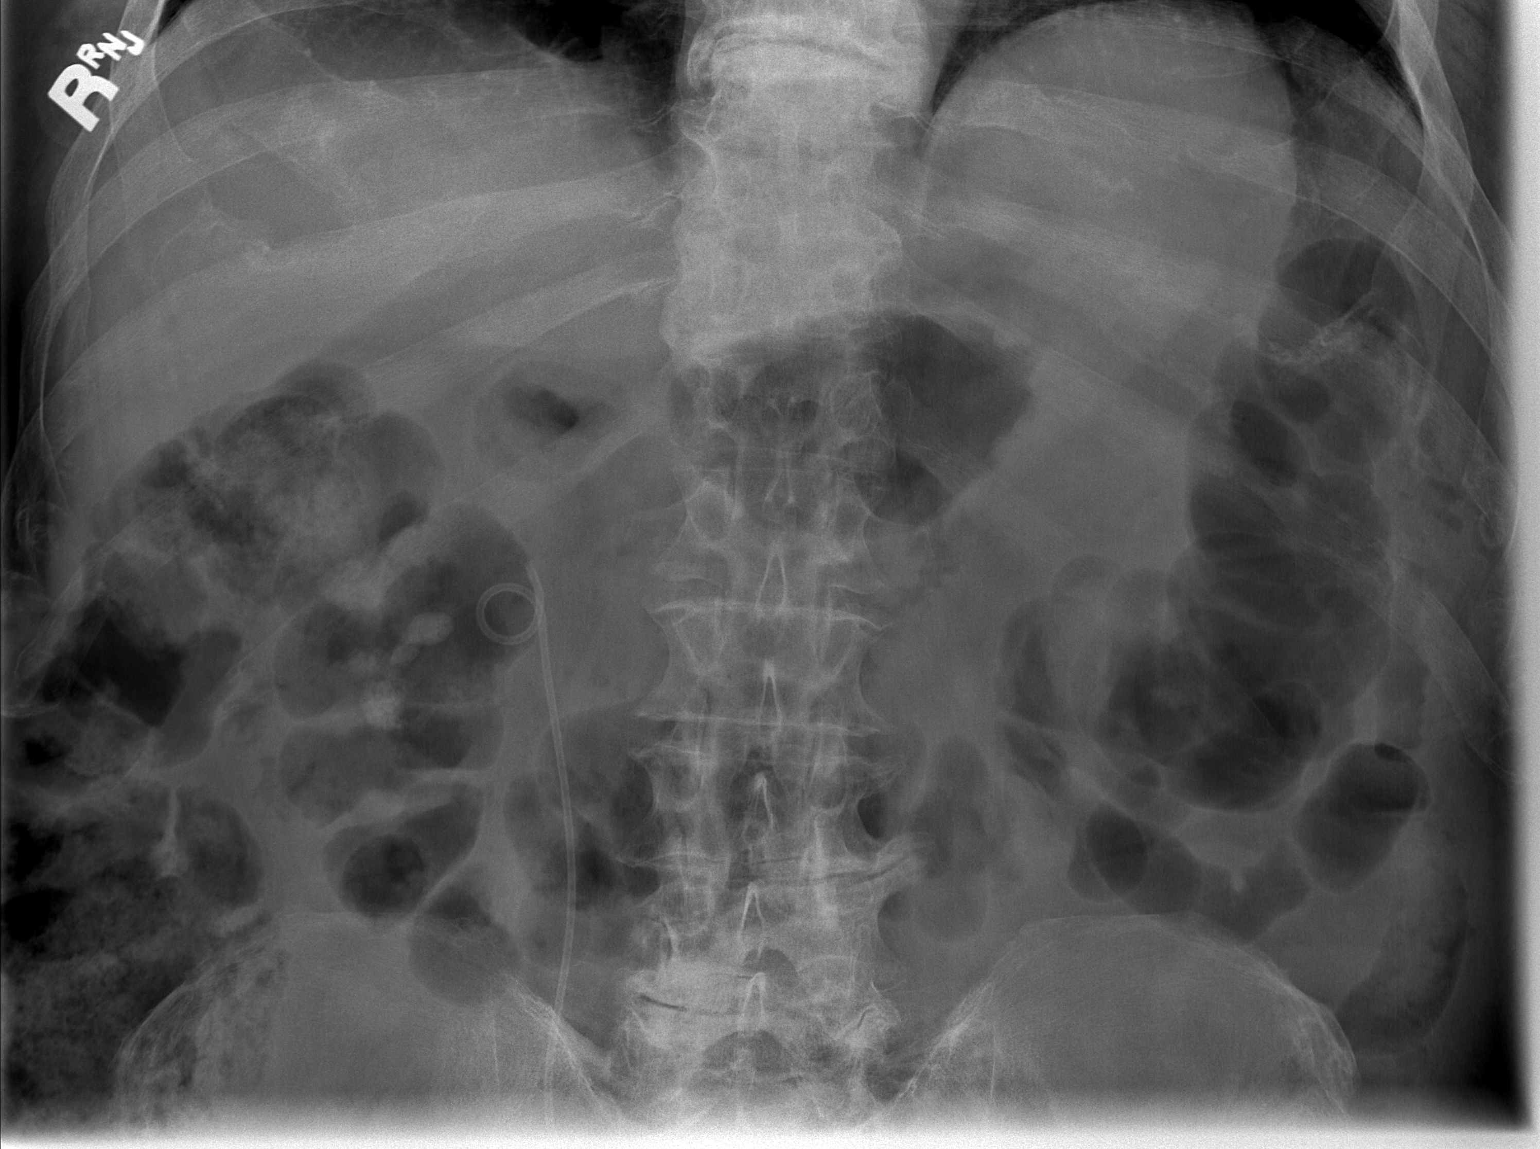

[2 of 2 positions shown; findings below may reference images not displayed]

FINDINGS: Multiple prominent calcified stones noted in the right kidney. No
interim change. Double-J right ureteral stent in stable position.
Stable pelvic calcifications consistent phleboliths. Degenerative
changes lumbar spine. Degenerative changes left hip. Sclerotic
changes about the left femoral head noted, avascular necrosis cannot
be excluded. Total right hip replacement. Prostate seed implants are
noted.
IMPRESSION: 1. Stable right nephrolithiasis. Stable positioning of right
ureteral stent.

2. Degenerative changes lumbar spine and left hip. Sclerotic changes
left femoral head. Avascular necrosis left femoral head cannot be
excluded. Total right hip replacement.

3.  Prostate seed implants noted.

## 2019-10-14 ENCOUNTER — Other Ambulatory Visit: Payer: Self-pay

## 2019-10-14 ENCOUNTER — Inpatient Hospital Stay: Payer: Medicare HMO

## 2019-10-14 ENCOUNTER — Inpatient Hospital Stay: Payer: Medicare HMO | Attending: Oncology | Admitting: Oncology

## 2019-10-14 VITALS — BP 143/74 | HR 69 | Temp 98.2°F | Resp 18 | Ht 68.0 in | Wt 229.3 lb

## 2019-10-14 DIAGNOSIS — C61 Malignant neoplasm of prostate: Secondary | ICD-10-CM | POA: Insufficient documentation

## 2019-10-14 LAB — CBC WITH DIFFERENTIAL (CANCER CENTER ONLY)
Abs Immature Granulocytes: 0.01 10*3/uL (ref 0.00–0.07)
Basophils Absolute: 0 10*3/uL (ref 0.0–0.1)
Basophils Relative: 1 %
Eosinophils Absolute: 0.2 10*3/uL (ref 0.0–0.5)
Eosinophils Relative: 4 %
HCT: 38.1 % — ABNORMAL LOW (ref 39.0–52.0)
Hemoglobin: 12.4 g/dL — ABNORMAL LOW (ref 13.0–17.0)
Immature Granulocytes: 0 %
Lymphocytes Relative: 27 %
Lymphs Abs: 1.2 10*3/uL (ref 0.7–4.0)
MCH: 31.9 pg (ref 26.0–34.0)
MCHC: 32.5 g/dL (ref 30.0–36.0)
MCV: 97.9 fL (ref 80.0–100.0)
Monocytes Absolute: 0.4 10*3/uL (ref 0.1–1.0)
Monocytes Relative: 10 %
Neutro Abs: 2.7 10*3/uL (ref 1.7–7.7)
Neutrophils Relative %: 58 %
Platelet Count: 155 10*3/uL (ref 150–400)
RBC: 3.89 MIL/uL — ABNORMAL LOW (ref 4.22–5.81)
RDW: 13.1 % (ref 11.5–15.5)
WBC Count: 4.6 10*3/uL (ref 4.0–10.5)
nRBC: 0 % (ref 0.0–0.2)

## 2019-10-14 LAB — CMP (CANCER CENTER ONLY)
ALT: 12 U/L (ref 0–44)
AST: 14 U/L — ABNORMAL LOW (ref 15–41)
Albumin: 4 g/dL (ref 3.5–5.0)
Alkaline Phosphatase: 72 U/L (ref 38–126)
Anion gap: 8 (ref 5–15)
BUN: 23 mg/dL (ref 8–23)
CO2: 24 mmol/L (ref 22–32)
Calcium: 8.9 mg/dL (ref 8.9–10.3)
Chloride: 109 mmol/L (ref 98–111)
Creatinine: 1.1 mg/dL (ref 0.61–1.24)
GFR, Est AFR Am: >60 mL/min (ref >60)
GFR, Estimated: >60 mL/min (ref >60)
Glucose, Bld: 101 mg/dL — ABNORMAL HIGH (ref 70–99)
Potassium: 4.3 mmol/L (ref 3.5–5.1)
Sodium: 141 mmol/L (ref 135–145)
Total Bilirubin: 0.5 mg/dL (ref 0.3–1.2)
Total Protein: 6.4 g/dL — ABNORMAL LOW (ref 6.5–8.1)

## 2019-10-14 NOTE — Progress Notes (Signed)
Hematology and Oncology Follow Up Visit  [name] [name] [id number] Jan 18, 1932 86 y.o. 10/14/2019 9:47 AM [name], [name] [place], MD   Principle Diagnosis: 86 year old man with castration-resistant prostate cancer diagnosed in 2016.  He presented with Gleason score of 9 in 1998 and has developed lymph node involvement.   Prior Therapy: He was started on firmagon in October 2014.   His PSA started to rise up to 1.91 in June 2016 after a rise from 0.78 in March 2016.PSA was 0.53 in November 2015.   Xtandi 160 mg daily started in August 2016.  Therapy interrupted until March 2018 because of poor tolerance.  Current therapy:    He is receiving Lupron every 3 months under the care of Dr. Jeffie Pollock.  Xtandi 40 mg daily started in July 2018.  He is currently taking 80 mg daily since December 2020.  Interim History: [name] [name] is here for return evaluation.  Since the last visit, he reports no major changes in his health.  He continues to tolerate Xtandi which has increased to 80 mg without any issues.  Denies any nausea, vomiting or abdominal pain.  Does report generalized fatigue and tiredness.  He does ambulate short distances with the help of cane although he has not had any falls or syncope.  He denies any bone pain or pathological fractures.    Medications: Updated on review.  Current Outpatient Medications  Medication Sig Dispense Refill  . diazepam (VALIUM) 10 MG tablet 1 2 TABLET BY MOUTH EVERY DAY TAKE ONE HOUR PRIOR TO PROCEDURE.    . enzalutamide (XTANDI) 40 MG capsule Take 2 capsules (80 mg total) by mouth daily. 60 capsule 2  . fexofenadine (ALLEGRA) 180 MG tablet Take 180 mg by mouth daily.    . fluticasone (FLONASE) 50 MCG/ACT nasal spray Place 2 sprays into both nostrils daily as needed for allergies or rhinitis.    Marland Kitchen levofloxacin (LEVAQUIN) 500 MG tablet Take 500 mg by mouth daily.    Marland Kitchen levothyroxine (SYNTHROID, LEVOTHROID) 150 MCG tablet Take 150 mcg by mouth daily  before breakfast.    . meloxicam (MOBIC) 15 MG tablet Take 15 mg by mouth daily.    . Multiple Vitamin (MULTIVITAMIN WITH MINERALS) TABS tablet Take 1 tablet by mouth 2 (two) times a week.     Marland Kitchen MYRBETRIQ 50 MG TB24 tablet Take 50 mg by mouth daily.   0  . oxyCODONE-acetaminophen (PERCOCET/ROXICET) 5-325 MG tablet Take 1 tablet by mouth every 6 (six) hours as needed.    . trimethoprim (TRIMPEX) 100 MG tablet Take 100 mg by mouth at bedtime.     No current facility-administered medications for this visit.     Allergies: No Known Allergies     Physical Exam:  Blood pressure (!) 143/74, pulse 69, temperature 98.2 F (36.8 C), temperature source Temporal, resp. rate 18, height 5\' 8"  (1.727 m), weight 229 lb 4.8 oz (104 kg), SpO2 97 %.    ECOG: 1   General appearance: Alert, awake without any distress. Head: Atraumatic without abnormalities Oropharynx: Without any thrush or ulcers. Eyes: No scleral icterus. Lymph nodes: No lymphadenopathy noted in the cervical, supraclavicular, or axillary nodes Heart:regular rate and rhythm, without any murmurs or gallops.   Lung: Clear to auscultation without any rhonchi, wheezes or dullness to percussion. Abdomin: Soft, nontender without any shifting dullness or ascites. Musculoskeletal: No clubbing or cyanosis. Neurological: No motor or sensory deficits. Skin: No rashes or lesions.    Lab Results: Lab Results  Component Value Date   WBC 4.6 10/14/2019   HGB 12.4 (L) 10/14/2019   HCT 38.1 (L) 10/14/2019   MCV 97.9 10/14/2019   PLT 155 10/14/2019     Chemistry      Component Value Date/Time   NA 141 06/13/2019 0939   NA 140 06/05/2017 1443   K 4.6 06/13/2019 0939   K 4.6 06/05/2017 1443   CL 104 06/13/2019 0939   CO2 28 06/13/2019 0939   CO2 24 06/05/2017 1443   BUN 24 (H) 06/13/2019 0939   BUN 22.7 06/05/2017 1443   CREATININE 1.32 (H) 06/13/2019 0939   CREATININE 1.2 06/05/2017 1443      Component Value Date/Time    CALCIUM 9.6 06/13/2019 0939   CALCIUM 9.4 06/05/2017 1443   ALKPHOS 59 06/13/2019 0939   ALKPHOS 66 06/05/2017 1443   AST 18 06/13/2019 0939   AST 17 06/05/2017 1443   ALT 15 06/13/2019 0939   ALT 15 06/05/2017 1443   BILITOT 0.6 06/13/2019 0939   BILITOT 0.41 06/05/2017 1443     Bone scan in December 2020. IMPRESSION: 1. Stable degenerative and posttraumatic uptake. 2. No findings suspicious for osseous metastatic disease. CT scan of the abdomen and pelvis in December 2020. MPRESSION: 1. New left para-aortic, left common iliac and left external iliac lymphadenopathy, suspicious for metastatic disease. 2. Stable indeterminate hypodense 2.7 cm pancreatic tail mass, with pancreatic malignancy not excluded. MRI abdomen without and with IV contrast is indicated for further characterization, and may be performed as clinically warranted. 3. Nonobstructing bilateral nephrolithiasis. Minimal left hydroureteronephrosis to the level of the left UVJ, without appreciable obstructing stone or lesion, with limitations as detailed. 4. Aortic Atherosclerosis (ICD10-I70.0).   Impression and Plan:   86 year old man with:  1.  Advanced prostate cancer diagnosed in 2016.  He has castration-resistant with lymphadenopathy.  He has been on Xtandi although his PSA started to rise in September 2020 was 4.9.  In December 2020 was up to 16.  CT scan and bone scan obtained in December 2020 under the care of Dr. Jeffie Pollock which showed progression of disease with pelvic adenopathy and no bone disease.  His Xtandi was increased to 80 mg daily since that time.  The natural course of this disease and alternative treatment options were reviewed at this time.  Switching to Zytiga, or systemic chemotherapy were reviewed in case his PSA continues to rise.  It would be a marginal chemotherapy candidate given his overall limited performance status but would be a possibility if he developed symptomatic disease  progression.  For the time being we will continue on Xtandi and monitor his PSA closely.     2.  Androgen deprivation: I recommended continuing androgen deprivation indefinitely.  He is currently receiving it under the care of Dr. Jeffie Pollock.  3.  Tail of pancreas lesion: Currently under active surveillance at this time.  He would not be a candidate for any aggressive surgical resection.  4.  Hypertension: No issues noted with his blood pressure at this time.  5.  Prognosis and goals of care: Therapy remains palliative at this time although aggressive measures are warranted at least for the time being given his overall quality of life.  6. Follow-up: In 3 months for repeat evaluation.  30 minutes spent on this encounter.  The time was dedicated to reviewing laboratory data, imaging studies and addressing complications related to current therapy and alternative therapies for the future.  Zola Button, MD 1/19/20219:47 AM

## 2019-10-15 ENCOUNTER — Telehealth: Payer: Self-pay | Admitting: Oncology

## 2019-10-15 ENCOUNTER — Telehealth: Payer: Self-pay

## 2019-10-15 LAB — PROSTATE-SPECIFIC AG, SERUM (LABCORP): Prostate Specific Ag, Serum: 17.7 ng/mL — ABNORMAL HIGH (ref 0.0–4.0)

## 2019-10-15 NOTE — Telephone Encounter (Signed)
Called patient and made him aware of his PSA result and no medication changes for now.  He verbalized understanding

## 2019-10-15 NOTE — Telephone Encounter (Signed)
Scheduled appt per 1/19 los.  Sent a message to HIM pool to get a calendar mailed out. 

## 2019-10-15 NOTE — Telephone Encounter (Signed)
-----   Message from Wyatt Portela, MD sent at 10/15/2019  8:34 AM EST ----- Please let him know his PSA. No changes in medication for now.

## 2019-10-16 MED FILL — XTANDI 40 MG CAPSULE: 40 | 30 days supply | Qty: 60 | Fill #1

## 2019-11-18 MED FILL — XTANDI 40 MG CAPSULE: 40 | 30 days supply | Qty: 60 | Fill #2

## 2019-12-03 ENCOUNTER — Other Ambulatory Visit: Payer: Self-pay | Admitting: Oncology

## 2019-12-03 DIAGNOSIS — C61 Malignant neoplasm of prostate: Secondary | ICD-10-CM

## 2019-12-03 MED ORDER — XTANDI 40 MG PO CAPS: 160.0000 mg | ORAL_CAPSULE | Freq: Every day | ORAL | 2 refills | Status: DC

## 2019-12-11 MED FILL — XTANDI 40 MG CAPSULE: 40 | 30 days supply | Qty: 120 | Fill #0

## 2020-01-06 MED FILL — XTANDI 40 MG CAPSULE: 40 | 30 days supply | Qty: 120 | Fill #1

## 2020-01-13 ENCOUNTER — Other Ambulatory Visit: Payer: Self-pay

## 2020-01-13 ENCOUNTER — Inpatient Hospital Stay: Payer: Medicare HMO | Attending: Oncology | Admitting: Oncology

## 2020-01-13 ENCOUNTER — Inpatient Hospital Stay: Payer: Medicare HMO

## 2020-01-13 VITALS — BP 116/61 | HR 76 | Temp 98.3°F | Resp 20 | Wt 224.7 lb

## 2020-01-13 DIAGNOSIS — I10 Essential (primary) hypertension: Secondary | ICD-10-CM | POA: Insufficient documentation

## 2020-01-13 DIAGNOSIS — K8689 Other specified diseases of pancreas: Secondary | ICD-10-CM | POA: Diagnosis not present

## 2020-01-13 DIAGNOSIS — C61 Malignant neoplasm of prostate: Secondary | ICD-10-CM

## 2020-01-13 DIAGNOSIS — Z79899 Other long term (current) drug therapy: Secondary | ICD-10-CM | POA: Diagnosis not present

## 2020-01-13 LAB — CMP (CANCER CENTER ONLY)
ALT: 9 U/L (ref 0–44)
AST: 14 U/L — ABNORMAL LOW (ref 15–41)
Albumin: 3.8 g/dL (ref 3.5–5.0)
Alkaline Phosphatase: 65 U/L (ref 38–126)
Anion gap: 9 (ref 5–15)
BUN: 27 mg/dL — ABNORMAL HIGH (ref 8–23)
CO2: 22 mmol/L (ref 22–32)
Calcium: 9.5 mg/dL (ref 8.9–10.3)
Chloride: 107 mmol/L (ref 98–111)
Creatinine: 1.35 mg/dL — ABNORMAL HIGH (ref 0.61–1.24)
GFR, Est AFR Am: 55 mL/min — ABNORMAL LOW (ref >60)
GFR, Estimated: 48 mL/min — ABNORMAL LOW (ref >60)
Glucose, Bld: 112 mg/dL — ABNORMAL HIGH (ref 70–99)
Potassium: 4.2 mmol/L (ref 3.5–5.1)
Sodium: 138 mmol/L (ref 135–145)
Total Bilirubin: 0.9 mg/dL (ref 0.3–1.2)
Total Protein: 6.9 g/dL (ref 6.5–8.1)

## 2020-01-13 LAB — CBC WITH DIFFERENTIAL (CANCER CENTER ONLY)
Abs Immature Granulocytes: 0.02 10*3/uL (ref 0.00–0.07)
Basophils Absolute: 0 10*3/uL (ref 0.0–0.1)
Basophils Relative: 1 %
Eosinophils Absolute: 0.3 10*3/uL (ref 0.0–0.5)
Eosinophils Relative: 4 %
HCT: 37.3 % — ABNORMAL LOW (ref 39.0–52.0)
Hemoglobin: 12.2 g/dL — ABNORMAL LOW (ref 13.0–17.0)
Immature Granulocytes: 0 %
Lymphocytes Relative: 18 %
Lymphs Abs: 1 10*3/uL (ref 0.7–4.0)
MCH: 32.1 pg (ref 26.0–34.0)
MCHC: 32.7 g/dL (ref 30.0–36.0)
MCV: 98.2 fL (ref 80.0–100.0)
Monocytes Absolute: 0.6 10*3/uL (ref 0.1–1.0)
Monocytes Relative: 10 %
Neutro Abs: 3.8 10*3/uL (ref 1.7–7.7)
Neutrophils Relative %: 67 %
Platelet Count: 153 10*3/uL (ref 150–400)
RBC: 3.8 MIL/uL — ABNORMAL LOW (ref 4.22–5.81)
RDW: 13.2 % (ref 11.5–15.5)
WBC Count: 5.7 10*3/uL (ref 4.0–10.5)
nRBC: 0 % (ref 0.0–0.2)

## 2020-01-13 NOTE — Progress Notes (Signed)
Hematology and Oncology Follow Up Visit  [name] [name] [id number] 1931-10-30 86 y.o. 01/13/2020 9:52 AM [name] [name], [name] [place], MD   Principle Diagnosis: 86 year old man with advanced prostate cancer with lymphadenopathy noted in 2016. He has castration-resistant after initial diagnosis in 1998 with Gleason score of 9 in 1998 and subsequently developed advanced disease in 2014.    Prior Therapy: He was started on firmagon in October 2014.   His PSA started to rise up to 1.91 in June 2016 after a rise from 0.78 in March 2016.PSA was 0.53 in November 2015.   Xtandi 160 mg daily started in August 2016.  Therapy interrupted until March 2018 because of poor tolerance.  Current therapy:    He is receiving Lupron every 3 months under the care of Dr. Jeffie Pollock.  Xtandi 40 mg daily started in July 2018.  He is currently taking 80 mg daily since December 2020.  His dose was increased to 160 mg in March 2021.  Interim History: [name] [name] returns today for a follow-up visit. Since the last visit, he reports worsening overall quality of life and further decline.  His mobility has been limited and ambulating with some assistance predominantly cane and walker.  He denies any recent falls or syncope.  Denies any worsening bone pain or back pain.  He does report urine incontinence but no hematuria or dysuria.  His performance status and quality of life continues to decline.   Medications: Unchanged on review. Current Outpatient Medications  Medication Sig Dispense Refill  . diazepam (VALIUM) 10 MG tablet 1 2 TABLET BY MOUTH EVERY DAY TAKE ONE HOUR PRIOR TO PROCEDURE.    . enzalutamide (XTANDI) 40 MG capsule Take 4 capsules (160 mg total) by mouth daily. 120 capsule 2  . fexofenadine (ALLEGRA) 180 MG tablet Take 180 mg by mouth daily.    . fluticasone (FLONASE) 50 MCG/ACT nasal spray Place 2 sprays into both nostrils daily as needed for allergies or rhinitis.    Marland Kitchen levofloxacin (LEVAQUIN) 500 MG  tablet Take 500 mg by mouth daily.    Marland Kitchen levothyroxine (SYNTHROID, LEVOTHROID) 150 MCG tablet Take 150 mcg by mouth daily before breakfast.    . meloxicam (MOBIC) 15 MG tablet Take 15 mg by mouth daily.    . Multiple Vitamin (MULTIVITAMIN WITH MINERALS) TABS tablet Take 1 tablet by mouth 2 (two) times a week.     Marland Kitchen MYRBETRIQ 50 MG TB24 tablet Take 50 mg by mouth daily.   0  . oxyCODONE-acetaminophen (PERCOCET/ROXICET) 5-325 MG tablet Take 1 tablet by mouth every 6 (six) hours as needed.    . trimethoprim (TRIMPEX) 100 MG tablet Take 100 mg by mouth at bedtime.     No current facility-administered medications for this visit.     Allergies: No Known Allergies     Physical Exam:  Blood pressure 116/61, pulse 76, temperature 98.3 F (36.8 C), temperature source Temporal, resp. rate 20, weight 224 lb 11.2 oz (101.9 kg), SpO2 96 %.     ECOG: 1    General appearance: Comfortable appearing without any discomfort Head: Normocephalic without any trauma Oropharynx: Mucous membranes are moist and pink without any thrush or ulcers. Eyes: Pupils are equal and round reactive to light. Lymph nodes: No cervical, supraclavicular, inguinal or axillary lymphadenopathy.   Heart:regular rate and rhythm.  S1 and S2 without leg edema. Lung: Clear without any rhonchi or wheezes.  No dullness to percussion. Abdomin: Soft, nontender, nondistended with good bowel sounds.  No hepatosplenomegaly.  Musculoskeletal: No joint deformity or effusion.  Full range of motion noted. Neurological: No deficits noted on motor, sensory and deep tendon reflex exam. Skin: No petechial rash or dryness.  Appeared moist.      Lab Results: Lab Results  Component Value Date   WBC 5.7 01/13/2020   HGB 12.2 (L) 01/13/2020   HCT 37.3 (L) 01/13/2020   MCV 98.2 01/13/2020   PLT 153 01/13/2020     Chemistry      Component Value Date/Time   NA 141 10/14/2019 0922   NA 140 06/05/2017 1443   K 4.3 10/14/2019 0922   K  4.6 06/05/2017 1443   CL 109 10/14/2019 0922   CO2 24 10/14/2019 0922   CO2 24 06/05/2017 1443   BUN 23 10/14/2019 0922   BUN 22.7 06/05/2017 1443   CREATININE 1.10 10/14/2019 0922   CREATININE 1.2 06/05/2017 1443      Component Value Date/Time   CALCIUM 8.9 10/14/2019 0922   CALCIUM 9.4 06/05/2017 1443   ALKPHOS 72 10/14/2019 0922   ALKPHOS 66 06/05/2017 1443   AST 14 (L) 10/14/2019 0922   AST 17 06/05/2017 1443   ALT 12 10/14/2019 0922   ALT 15 06/05/2017 1443   BILITOT 0.5 10/14/2019 0922   BILITOT 0.41 06/05/2017 1443        Impression and Plan:   86 year old man with:  1. Castration-resistant prostate cancer with lymphadenopathy diagnosed in 2016.    He continues to be on Xtandi without any major complications. Risks and benefits of continuing this therapy were reviewed. Potential complications including high blood pressure, hematuria and fatigue were reiterated. Alternative options remain systemic chemotherapy versus supportive care only.  Overall I feel that his quality of life is declining which could be related to escalating doses of Xtandi versus cancer progression.  The natural course of this disease was reviewed again and depending on the results of the PSA will dictate how we proceed further.  Dropping Xtandi dose or discontinuation altogether may be a possibility and proceeding with supportive care only especially given his overall decline in his performance status.  He is against the idea of systemic chemotherapy and overall he will be a poor candidate for it.     2.  Androgen deprivation: He continues to receive that under the care of Dr. Written. I recommend continuing this indefinitely.  3.  Tail of pancreas mass: He has declined the repeat imaging studies and overall not a candidate for surgical resection. We will continue to monitor periodically.  4.  Hypertension: Blood pressure is within normal range at this time  5.  Prognosis and goals of care: His  disease is incurable and any therapy remains palliative at this time. Aggressive measures are warranted given his reasonable performance status.  6. Follow-up: He will return in 3 months for follow-up.  30 minutes were dedicated to this visit. The time was spent on reviewing his disease status, reviewing laboratory data, treatment options and addressing complications with his current therapy.  Zola Button, MD 4/20/20219:52 AM

## 2020-01-14 ENCOUNTER — Telehealth: Payer: Self-pay

## 2020-01-14 ENCOUNTER — Telehealth: Payer: Self-pay | Admitting: Oncology

## 2020-01-14 LAB — PROSTATE-SPECIFIC AG, SERUM (LABCORP): Prostate Specific Ag, Serum: 34.2 ng/mL — ABNORMAL HIGH (ref 0.0–4.0)

## 2020-01-14 NOTE — Telephone Encounter (Signed)
Called patient and made him aware of Dr. Hazeline Junker instructions. Patient verbalized understanding and stated he will stop Xtandi.

## 2020-01-14 NOTE — Telephone Encounter (Signed)
-----   Message from Wyatt Portela, MD sent at 01/14/2020  8:36 AM EDT ----- Please let him know his PSA is up.  I recommend stopping the Xtandi but we discussed our visit.

## 2020-01-14 NOTE — Telephone Encounter (Signed)
Scheduled appt per 4/20 los. °

## 2020-02-09 ENCOUNTER — Telehealth: Payer: Self-pay | Admitting: Oncology

## 2020-02-09 NOTE — Telephone Encounter (Signed)
Called pt per 5/17 sch message - no answer. No vmail .

## 2020-03-11 ENCOUNTER — Telehealth: Payer: Self-pay | Admitting: *Deleted

## 2020-03-11 NOTE — Telephone Encounter (Signed)
Patient called about his recent statement from [facility].  He states he doesn't understand it considering his treatment has stopped.    Routed to Oral Oncology pharmacist and patient advocate to reach out to patient to help him decipher the statement he has and what actions he needs to take.

## 2020-03-11 NOTE — Telephone Encounter (Signed)
I called patient to let him know he could ignore the letter since he is no longer on oral chemo.  Patient understood.  [place] Patient [place] Phone [phone] Fax [phone] 03/11/2020 12:33 PM

## 2020-03-16 ENCOUNTER — Ambulatory Visit (HOSPITAL_COMMUNITY): Payer: Medicare HMO

## 2020-03-16 ENCOUNTER — Encounter (HOSPITAL_COMMUNITY)
Admission: EM | Disposition: A | Discharge status: Home Health | Payer: Self-pay | Source: Home / Self Care | Attending: Internal Medicine

## 2020-03-16 ENCOUNTER — Other Ambulatory Visit: Payer: Self-pay

## 2020-03-16 ENCOUNTER — Inpatient Hospital Stay (HOSPITAL_COMMUNITY)
Admission: EM | Admit: 2020-03-16 | Discharge: 2020-03-23 | DRG: 854 | Disposition: A | Discharge status: Home Health | Payer: Medicare HMO | Attending: Internal Medicine | Admitting: Internal Medicine

## 2020-03-16 ENCOUNTER — Inpatient Hospital Stay (HOSPITAL_COMMUNITY): Payer: Medicare HMO

## 2020-03-16 ENCOUNTER — Encounter (HOSPITAL_COMMUNITY): Payer: Self-pay | Admitting: Cardiovascular Disease

## 2020-03-16 DIAGNOSIS — I4510 Unspecified right bundle-branch block: Secondary | ICD-10-CM | POA: Diagnosis present

## 2020-03-16 DIAGNOSIS — B957 Other staphylococcus as the cause of diseases classified elsewhere: Secondary | ICD-10-CM | POA: Diagnosis present

## 2020-03-16 DIAGNOSIS — R6520 Severe sepsis without septic shock: Secondary | ICD-10-CM | POA: Diagnosis not present

## 2020-03-16 DIAGNOSIS — N390 Urinary tract infection, site not specified: Secondary | ICD-10-CM | POA: Diagnosis not present

## 2020-03-16 DIAGNOSIS — N3000 Acute cystitis without hematuria: Secondary | ICD-10-CM

## 2020-03-16 DIAGNOSIS — N529 Male erectile dysfunction, unspecified: Secondary | ICD-10-CM | POA: Diagnosis present

## 2020-03-16 DIAGNOSIS — J90 Pleural effusion, not elsewhere classified: Secondary | ICD-10-CM | POA: Diagnosis present

## 2020-03-16 DIAGNOSIS — R9431 Abnormal electrocardiogram [ECG] [EKG]: Secondary | ICD-10-CM | POA: Diagnosis not present

## 2020-03-16 DIAGNOSIS — Z973 Presence of spectacles and contact lenses: Secondary | ICD-10-CM

## 2020-03-16 DIAGNOSIS — M1990 Unspecified osteoarthritis, unspecified site: Secondary | ICD-10-CM | POA: Diagnosis present

## 2020-03-16 DIAGNOSIS — Z8546 Personal history of malignant neoplasm of prostate: Secondary | ICD-10-CM

## 2020-03-16 DIAGNOSIS — I5032 Chronic diastolic (congestive) heart failure: Secondary | ICD-10-CM | POA: Diagnosis present

## 2020-03-16 DIAGNOSIS — I248 Other forms of acute ischemic heart disease: Secondary | ICD-10-CM | POA: Diagnosis present

## 2020-03-16 DIAGNOSIS — A419 Sepsis, unspecified organism: Secondary | ICD-10-CM | POA: Diagnosis present

## 2020-03-16 DIAGNOSIS — Z79899 Other long term (current) drug therapy: Secondary | ICD-10-CM

## 2020-03-16 DIAGNOSIS — C61 Malignant neoplasm of prostate: Secondary | ICD-10-CM | POA: Diagnosis present

## 2020-03-16 DIAGNOSIS — Z9079 Acquired absence of other genital organ(s): Secondary | ICD-10-CM | POA: Diagnosis not present

## 2020-03-16 DIAGNOSIS — G8929 Other chronic pain: Secondary | ICD-10-CM | POA: Diagnosis present

## 2020-03-16 DIAGNOSIS — N136 Pyonephrosis: Secondary | ICD-10-CM | POA: Diagnosis present

## 2020-03-16 DIAGNOSIS — M25512 Pain in left shoulder: Secondary | ICD-10-CM | POA: Diagnosis present

## 2020-03-16 DIAGNOSIS — K869 Disease of pancreas, unspecified: Secondary | ICD-10-CM | POA: Diagnosis present

## 2020-03-16 DIAGNOSIS — C786 Secondary malignant neoplasm of retroperitoneum and peritoneum: Secondary | ICD-10-CM | POA: Diagnosis present

## 2020-03-16 DIAGNOSIS — N183 Chronic kidney disease, stage 3 unspecified: Secondary | ICD-10-CM | POA: Diagnosis present

## 2020-03-16 DIAGNOSIS — E039 Hypothyroidism, unspecified: Secondary | ICD-10-CM | POA: Diagnosis present

## 2020-03-16 DIAGNOSIS — Z419 Encounter for procedure for purposes other than remedying health state, unspecified: Secondary | ICD-10-CM

## 2020-03-16 DIAGNOSIS — Z87442 Personal history of urinary calculi: Secondary | ICD-10-CM | POA: Diagnosis not present

## 2020-03-16 DIAGNOSIS — W19XXXA Unspecified fall, initial encounter: Secondary | ICD-10-CM | POA: Diagnosis present

## 2020-03-16 DIAGNOSIS — Z79890 Hormone replacement therapy (postmenopausal): Secondary | ICD-10-CM

## 2020-03-16 DIAGNOSIS — Z20822 Contact with and (suspected) exposure to covid-19: Secondary | ICD-10-CM | POA: Diagnosis present

## 2020-03-16 DIAGNOSIS — J45909 Unspecified asthma, uncomplicated: Secondary | ICD-10-CM | POA: Diagnosis present

## 2020-03-16 DIAGNOSIS — C7951 Secondary malignant neoplasm of bone: Secondary | ICD-10-CM | POA: Diagnosis present

## 2020-03-16 DIAGNOSIS — R778 Other specified abnormalities of plasma proteins: Secondary | ICD-10-CM | POA: Diagnosis not present

## 2020-03-16 DIAGNOSIS — I130 Hypertensive heart and chronic kidney disease with heart failure and stage 1 through stage 4 chronic kidney disease, or unspecified chronic kidney disease: Secondary | ICD-10-CM | POA: Diagnosis present

## 2020-03-16 DIAGNOSIS — R32 Unspecified urinary incontinence: Secondary | ICD-10-CM | POA: Diagnosis present

## 2020-03-16 DIAGNOSIS — Z96641 Presence of right artificial hip joint: Secondary | ICD-10-CM | POA: Diagnosis present

## 2020-03-16 DIAGNOSIS — R531 Weakness: Secondary | ICD-10-CM | POA: Diagnosis not present

## 2020-03-16 DIAGNOSIS — N1330 Unspecified hydronephrosis: Secondary | ICD-10-CM | POA: Diagnosis not present

## 2020-03-16 DIAGNOSIS — N1832 Chronic kidney disease, stage 3b: Secondary | ICD-10-CM | POA: Diagnosis present

## 2020-03-16 DIAGNOSIS — I2489 Other forms of acute ischemic heart disease: Secondary | ICD-10-CM | POA: Diagnosis present

## 2020-03-16 DIAGNOSIS — R079 Chest pain, unspecified: Secondary | ICD-10-CM | POA: Diagnosis not present

## 2020-03-16 DIAGNOSIS — Z791 Long term (current) use of non-steroidal anti-inflammatories (NSAID): Secondary | ICD-10-CM

## 2020-03-16 LAB — COMPREHENSIVE METABOLIC PANEL
ALT: 20 U/L (ref 0–44)
AST: 28 U/L (ref 15–41)
Albumin: 3.8 g/dL (ref 3.5–5.0)
Alkaline Phosphatase: 66 U/L (ref 38–126)
Anion gap: 11 (ref 5–15)
BUN: 26 mg/dL — ABNORMAL HIGH (ref 8–23)
CO2: 18 mmol/L — ABNORMAL LOW (ref 22–32)
Calcium: 9 mg/dL (ref 8.9–10.3)
Chloride: 106 mmol/L (ref 98–111)
Creatinine, Ser: 1.6 mg/dL — ABNORMAL HIGH (ref 0.61–1.24)
GFR calc Af Amer: 45 mL/min — ABNORMAL LOW (ref >60)
GFR calc non Af Amer: 38 mL/min — ABNORMAL LOW (ref >60)
Glucose, Bld: 174 mg/dL — ABNORMAL HIGH (ref 70–99)
Potassium: 4.3 mmol/L (ref 3.5–5.1)
Sodium: 135 mmol/L (ref 135–145)
Total Bilirubin: 0.6 mg/dL (ref 0.3–1.2)
Total Protein: 6.7 g/dL (ref 6.5–8.1)

## 2020-03-16 LAB — CBC WITH DIFFERENTIAL/PLATELET
Abs Immature Granulocytes: 0.1 10*3/uL — ABNORMAL HIGH (ref 0.00–0.07)
Basophils Absolute: 0 10*3/uL (ref 0.0–0.1)
Basophils Relative: 0 %
Eosinophils Absolute: 0 10*3/uL (ref 0.0–0.5)
Eosinophils Relative: 0 %
HCT: 35.9 % — ABNORMAL LOW (ref 39.0–52.0)
Hemoglobin: 11.3 g/dL — ABNORMAL LOW (ref 13.0–17.0)
Immature Granulocytes: 1 %
Lymphocytes Relative: 3 %
Lymphs Abs: 0.3 10*3/uL — ABNORMAL LOW (ref 0.7–4.0)
MCH: 31.6 pg (ref 26.0–34.0)
MCHC: 31.5 g/dL (ref 30.0–36.0)
MCV: 100.3 fL — ABNORMAL HIGH (ref 80.0–100.0)
Monocytes Absolute: 0.7 10*3/uL (ref 0.1–1.0)
Monocytes Relative: 6 %
Neutro Abs: 10.4 10*3/uL — ABNORMAL HIGH (ref 1.7–7.7)
Neutrophils Relative %: 90 %
Platelets: 167 10*3/uL (ref 150–400)
RBC: 3.58 MIL/uL — ABNORMAL LOW (ref 4.22–5.81)
RDW: 13.2 % (ref 11.5–15.5)
WBC: 11.6 10*3/uL — ABNORMAL HIGH (ref 4.0–10.5)
nRBC: 0 % (ref 0.0–0.2)

## 2020-03-16 LAB — URINALYSIS, ROUTINE W REFLEX MICROSCOPIC
Bilirubin Urine: NEGATIVE
Glucose, UA: NEGATIVE mg/dL
Ketones, ur: NEGATIVE mg/dL
Nitrite: POSITIVE — AB
Protein, ur: 30 mg/dL — AB
Specific Gravity, Urine: 1.014 (ref 1.005–1.030)
pH: 7 (ref 5.0–8.0)

## 2020-03-16 LAB — I-STAT CHEM 8, ED
BUN: 28 mg/dL — ABNORMAL HIGH (ref 8–23)
Calcium, Ion: 1.17 mmol/L (ref 1.15–1.40)
Chloride: 107 mmol/L (ref 98–111)
Creatinine, Ser: 1.5 mg/dL — ABNORMAL HIGH (ref 0.61–1.24)
Glucose, Bld: 174 mg/dL — ABNORMAL HIGH (ref 70–99)
HCT: 33 % — ABNORMAL LOW (ref 39.0–52.0)
Hemoglobin: 11.2 g/dL — ABNORMAL LOW (ref 13.0–17.0)
Potassium: 4.2 mmol/L (ref 3.5–5.1)
Sodium: 140 mmol/L (ref 135–145)
TCO2: 21 mmol/L — ABNORMAL LOW (ref 22–32)

## 2020-03-16 LAB — CK: Total CK: 94 U/L (ref 49–397)

## 2020-03-16 LAB — LACTIC ACID, PLASMA
Lactic Acid, Venous: 3.3 mmol/L (ref 0.5–1.9)
Lactic Acid, Venous: 2.3 mmol/L (ref 0.5–1.9)

## 2020-03-16 LAB — TROPONIN I (HIGH SENSITIVITY)
Troponin I (High Sensitivity): 43 ng/L — ABNORMAL HIGH (ref <18)
Troponin I (High Sensitivity): 124 ng/L (ref <18)

## 2020-03-16 LAB — SARS CORONAVIRUS 2 BY RT PCR (HOSPITAL ORDER, PERFORMED IN ~~LOC~~ HOSPITAL LAB): SARS Coronavirus 2: NEGATIVE

## 2020-03-16 SURGERY — LEFT HEART CATH AND CORONARY ANGIOGRAPHY
Anesthesia: LOCAL

## 2020-03-16 MED ORDER — ONDANSETRON HCL 4 MG/2ML IJ SOLN
4.0000 mg | Freq: Four times a day (QID) | INTRAMUSCULAR | Status: DC | PRN
  Administered 2020-03-19: 4 mg via INTRAVENOUS

## 2020-03-16 MED ORDER — HEPARIN (PORCINE) IN NACL 1000-0.9 UT/500ML-% IV SOLN
INTRAVENOUS | Status: AC
  Filled 2020-03-16: qty 1000

## 2020-03-16 MED ORDER — FLUTICASONE PROPIONATE 50 MCG/ACT NA SUSP: 2.0000 | Freq: Every day | NASAL | Status: DC | PRN

## 2020-03-16 MED ORDER — LORATADINE 10 MG PO TABS
10.0000 mg | ORAL_TABLET | Freq: Every day | ORAL | Status: DC
  Administered 2020-03-17 – 2020-03-23 (×7): 10 mg via ORAL
  Filled 2020-03-16 (×7): qty 1

## 2020-03-16 MED ORDER — ENOXAPARIN SODIUM 40 MG/0.4ML ~~LOC~~ SOLN
40.0000 mg | Freq: Every day | SUBCUTANEOUS | Status: DC
  Administered 2020-03-17 – 2020-03-22 (×7): 40 mg via SUBCUTANEOUS
  Filled 2020-03-16 (×7): qty 0.4

## 2020-03-16 MED ORDER — ALBUTEROL SULFATE (2.5 MG/3ML) 0.083% IN NEBU: 2.5000 mg | INHALATION_SOLUTION | Freq: Four times a day (QID) | RESPIRATORY_TRACT | Status: DC | PRN

## 2020-03-16 MED ORDER — DIAZEPAM 5 MG PO TABS: 10.0000 mg | ORAL_TABLET | Freq: Once | ORAL | Status: DC | PRN

## 2020-03-16 MED ORDER — ONDANSETRON HCL 4 MG PO TABS: 4.0000 mg | ORAL_TABLET | Freq: Four times a day (QID) | ORAL | Status: DC | PRN

## 2020-03-16 MED ORDER — LEVOTHYROXINE SODIUM 75 MCG PO TABS
150.0000 ug | ORAL_TABLET | Freq: Every day | ORAL | Status: DC
  Administered 2020-03-17 – 2020-03-23 (×7): 150 ug via ORAL
  Filled 2020-03-16 (×7): qty 2

## 2020-03-16 MED ORDER — LIDOCAINE HCL (PF) 1 % IJ SOLN
INTRAMUSCULAR | Status: AC
  Filled 2020-03-16: qty 30

## 2020-03-16 MED ORDER — MIRABEGRON ER 50 MG PO TB24
50.0000 mg | ORAL_TABLET | Freq: Every day | ORAL | Status: DC
  Administered 2020-03-17 – 2020-03-23 (×7): 50 mg via ORAL
  Filled 2020-03-16 (×7): qty 1

## 2020-03-16 MED ORDER — SODIUM CHLORIDE 0.9 % IV SOLN
1.0000 g | Freq: Once | INTRAVENOUS | Status: AC
  Administered 2020-03-16: 1 g via INTRAVENOUS
  Filled 2020-03-16: qty 1

## 2020-03-16 MED ORDER — SODIUM CHLORIDE 0.9 % IV BOLUS
1000.0000 mL | Freq: Once | INTRAVENOUS | Status: AC
  Administered 2020-03-16: 1000 mL via INTRAVENOUS

## 2020-03-16 MED ORDER — IOHEXOL 350 MG/ML SOLN
INTRAVENOUS | Status: AC
  Filled 2020-03-16: qty 1

## 2020-03-16 MED ORDER — SODIUM CHLORIDE 0.9 % IV SOLN
1.0000 g | INTRAVENOUS | Status: DC
  Administered 2020-03-17 – 2020-03-20 (×4): 1 g via INTRAVENOUS
  Filled 2020-03-16 (×4): qty 10

## 2020-03-16 MED ORDER — ACETAMINOPHEN 325 MG PO TABS
650.0000 mg | ORAL_TABLET | Freq: Once | ORAL | Status: AC
  Administered 2020-03-16: 650 mg via ORAL
  Filled 2020-03-16: qty 2

## 2020-03-16 MED ORDER — VERAPAMIL HCL 2.5 MG/ML IV SOLN
INTRAVENOUS | Status: AC
  Filled 2020-03-16: qty 2

## 2020-03-16 MED ORDER — MELOXICAM 7.5 MG PO TABS
15.0000 mg | ORAL_TABLET | Freq: Every day | ORAL | Status: DC
  Administered 2020-03-17 – 2020-03-23 (×7): 15 mg via ORAL
  Filled 2020-03-16 (×7): qty 2

## 2020-03-16 MED ORDER — ACETAMINOPHEN 325 MG PO TABS: 650.0000 mg | ORAL_TABLET | Freq: Four times a day (QID) | ORAL | Status: DC | PRN

## 2020-03-16 MED ORDER — ACETAMINOPHEN 650 MG RE SUPP: 650.0000 mg | Freq: Four times a day (QID) | RECTAL | Status: DC | PRN

## 2020-03-16 MED ORDER — OXYCODONE-ACETAMINOPHEN 5-325 MG PO TABS
1.0000 | ORAL_TABLET | Freq: Four times a day (QID) | ORAL | Status: DC | PRN
  Administered 2020-03-17 – 2020-03-22 (×5): 1 via ORAL
  Filled 2020-03-16 (×5): qty 1

## 2020-03-16 MED ORDER — NITROGLYCERIN 1 MG/10 ML FOR IR/CATH LAB
INTRA_ARTERIAL | Status: AC
  Filled 2020-03-16: qty 10

## 2020-03-16 MED ORDER — VANCOMYCIN HCL IN DEXTROSE 1-5 GM/200ML-% IV SOLN: 1000.0000 mg | Freq: Once | INTRAVENOUS | Status: DC

## 2020-03-16 MED ORDER — ADULT MULTIVITAMIN W/MINERALS CH
1.0000 | ORAL_TABLET | ORAL | Status: DC
  Administered 2020-03-18 – 2020-03-22 (×2): 1 via ORAL
  Filled 2020-03-16 (×2): qty 1

## 2020-03-16 NOTE — Progress Notes (Signed)
Interventional Cardiology Consult Note:   Code STEMI called by EMS. Pt with limited mobility, advanced prostate cancer who fell at home. Denies any chest pain or dyspnea. He hit his head when he fell. EKG reviewed and it shows ST elevation in leads V2 and V3. Old EKG from 2020 shows ST elevation in leads V2 and V3 in a Brugada pattern. He is febrile.  Code STEMI cancelled. I spoke to Dr. Darl Householder in the ED.  ED staff to start workup to include head CT and labs, serial troponin.   Cardiology available tonight as needed for assistance.   Lauree Chandler 03/16/2020 6:55 PM

## 2020-03-16 NOTE — H&P (Signed)
History and Physical    [name] [id number]:[id number] DOB: 24-Dec-1931 DOA: 03/16/2020  PCP: Maury Dus, MD  Patient coming from: Home  I have personally briefly reviewed patient's old medical records in [place]  Chief Complaint: Generalized weakness  HPI: [name] [name] is a 86 y.o. male with medical history significant of HTN, prostate CA.  Pt presents to ED with c/o generalized weakness.   Patient states that he was transferring from his chair and felt weak and fell face forward.  He states that he is wife called immediately.  EMS noticed that he was on the floor and was having some chest pain.  His EKG in the field showed STEMI in V1 and V2.  Patient states that he has not been on it for that long.  He states that he was given aspirin 324 mg by EMS.  He has no chest pain currently.  Patient is not on any blood thinners.   ED Course: Cards saw pt: not a STEMI.  Found to have Tm 101.  Found to have UTI.  WBC 11.6.  Lactate 3.3.  Creat 1.6, slightly up from baseline of 1.35 earlier this year.  Trops were 43 and 124.  Cards feels that this is demand ischemia, pt remains CP free.   Review of Systems: As per HPI, otherwise all review of systems negative.  Past Medical History:  Diagnosis Date  . Allergic rhinitis   . Arthritis   . Asthma    no inhalers  . Chronic left shoulder pain    AND DECREASE ROM--  END-STAGE OA  . Heart murmur   . History of cardiac murmur    ASYMPTOMATIC  . History of kidney stones   . History of urinary tract obstruction    BNC  . Hydronephrosis, left    possible distal stone  . Hypertension    no meds  pt. denies at preop  . Hypothyroidism   . OA (osteoarthritis)   . Organic impotence   . RBBB (right bundle branch block)   . 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 Urinary incontinence   . Wears glasses     Past Surgical History:  Procedure Laterality Date  . CYSTO/  UNROOFING RIGHT URETEROCELE AND STONE MANIPULATION  02-23-2011  . CYSTOSCOPY W/ URETERAL STENT PLACEMENT Left 05/01/2016   Procedure: CYSTOSCOPY WITH LEFT RETROGRADE PYELOGRAM/URETERAL LEFT STENT PLACEMENT;  Surgeon: [name], MD;  Location: [facility];  Service: Urology;  Laterality: Left;  . CYSTOSCOPY W/ URETERAL STENT PLACEMENT Left 05/18/2016   Procedure: CYSTOSCOPY WITH STENT REPLACEMENT;  Surgeon: [name], MD;  Location: [facility];  Service: Urology;  Laterality: Left;  . CYSTOSCOPY WITH LITHOLAPAXY N/A 05/18/2016   Procedure: CYSTOSCOPY WITH LITHOLAPAXY;  Surgeon: [name], MD;  Location: [facility];  Service: Urology;  Laterality: N/A;  . cystoscopy with lithotripsy ereteroscopy/ hollium laser     06-27-18 Dr. Jeffie Pollock   . CYSTOSCOPY WITH URETEROSCOPY AND STENT PLACEMENT Left 09/30/2013   Procedure: LEFT CYSTOSCOPY WITH URETEROSCOPY, STONE EXTRACTION  AND STENT REPLACEMENT;  Surgeon: [name], MD;  Location: [facility];  Service: Urology;  Laterality: Left;  . CYSTOSCOPY WITH URETEROSCOPY AND STENT PLACEMENT Left  05/18/2016   Procedure: CYSTOSCOPY WITH URETEROSCOPY;  Surgeon: [name], MD;  Location: [facility];  Service: Urology;  Laterality: Left;  . CYSTOSCOPY WITH URETEROSCOPY, STONE BASKETRY AND STENT PLACEMENT Left 07/08/2013   Procedure: LEFT  URETEROSCOPY,;  Surgeon: [name], MD;  [facility];  Service: Urology;  Laterality: Left;  . CYSTOSCOPY/RETROGRADE/URETEROSCOPY/STONE EXTRACTION WITH BASKET N/A 12/05/2016   Procedure: CYSTOSCOPY AND FOLEY CATHERTER PLACEMENT;  Surgeon: [name], MD;  Location: [facility];  Service: Urology;  Laterality: N/A;  . CYSTOSCOPY/URETEROSCOPY/HOLMIUM LASER/STENT  PLACEMENT Right 06/27/2018   Procedure: CYSTOSCOPY/RIGHT URETEROSCOPY/ RIGHT RETROGRADE PYELOGRAM/ HOLMIUM LASER/ RIGHT STENT PLACEMENT;  Surgeon: [name], MD;  Location: [facility];  Service: Urology;  Laterality: Right;  . EXTRACORPOREAL SHOCK WAVE LITHOTRIPSY    . EXTRACORPOREAL SHOCK WAVE LITHOTRIPSY Left 07/11/2018   Procedure: EXTRACORPOREAL SHOCK WAVE LITHOTRIPSY (ESWL);  Surgeon: Cleon Gustin, MD;  Location: [facility];  Service: Urology;  Laterality: Left;  . EYE SURGERY Bilateral 09/22/13   cataract w/lens  . HOLMIUM LASER APPLICATION Left 11/27/7423   Procedure: HOLMIUM LASER APPLICATION;  Surgeon: [name], MD;  Location[facility];  Service: Urology;  Laterality: Left;  . HOLMIUM LASER APPLICATION N/A 05/30/6386   Procedure: HOLMIUM LASER APPLICATION;  Surgeon: [name], MD;  Location: [facility];  Service: Urology;  Laterality: N/A;  . HOLMIUM LASER APPLICATION N/A 5/64/3329   Procedure: HOLMIUM LASER APPLICATION;  Surgeon: [name], MD;  Location: [facility];  Service: Urology;  Laterality: N/A;  . ORIF LEFT ANKLE FX  01-15-2002  . PERCUTANEOUS NEPHROSTOMY Left 07/16/13   removed 07/21/13  . RADIOACTIVE PROSTATE SEED IMPLANTS  1997  . STONE EXTRACTION WITH BASKET Left 05/18/2016   Procedure: STONE EXTRACTION WITH BASKET;  Surgeon: [name], MD;  Location: [facility];  Service: Urology;  Laterality: Left;  . TOTAL HIP ARTHROPLASTY Right 06-29-2009  . TRANSURETHRAL RESECTION OF BLADDER NECK N/A 07/08/2013   Procedure: TRANSURETHRAL RESECTION OF BLADDER NECK;  Surgeon: [name], MD;  Location: [facility];  Service: Urology;  Laterality: N/A;  resection of baldder neck  . URETERAL STENT PLACEMENT Left 07/21/13     reports that he has never smoked. He has never used smokeless tobacco. He reports that he does not drink alcohol and does not use drugs.  No Known Allergies  History reviewed. No pertinent family  history.   Prior to Admission medications   Medication Sig Start Date End Date Taking? Authorizing Provider  albuterol (VENTOLIN HFA) 108 (90 Base) MCG/ACT inhaler Inhale 2 puffs into the lungs every 6 (six) hours as needed for wheezing or shortness of breath.   Yes [provider]  fexofenadine (ALLEGRA) 180 MG tablet Take 180 mg by mouth daily.   Yes [provider]  fluticasone (FLONASE) 50 MCG/ACT nasal spray Place 2 sprays into both nostrils daily as needed for allergies or rhinitis.   Yes [provider]  levothyroxine (SYNTHROID, LEVOTHROID) 150 MCG tablet Take 150 mcg by mouth daily before breakfast.   Yes [provider]  meloxicam (MOBIC) 15 MG tablet Take 15 mg by mouth daily.   Yes [provider]  Multiple Vitamin (MULTIVITAMIN WITH MINERALS) TABS tablet Take 1 tablet by mouth 2 (two) times a week.    Yes [provider]  MYRBETRIQ 50 MG TB24 tablet Take 50 mg by mouth daily.  08/31/15  Yes [provider]  oxyCODONE-acetaminophen (PERCOCET/ROXICET) 5-325 MG  tablet Take 1 tablet by mouth every 6 (six) hours as needed (for pain).  06/25/19  Yes [provider]  trimethoprim (TRIMPEX) 100 MG tablet Take 100 mg by mouth at bedtime.   Yes [provider]  diazepam (VALIUM) 10 MG tablet Take 10 mg by mouth once as needed (one hour prior to procedure).  09/17/19   [provider]    Physical Exam: Vitals:   03/16/20 2046 03/16/20 2100 03/16/20 2146 03/16/20 2216  BP: 109/66 103/66 101/68 109/65  Pulse: 78 77 73 74  Resp: 16 (!) 23 17 16   Temp: (!) 101 F (38.3 C)     TempSrc: Rectal     SpO2: 97% 100% 98% 97%    Constitutional: NAD, calm, comfortable Eyes: PERRL, lids and conjunctivae normal ENMT: Mucous membranes are moist. Posterior pharynx clear of any exudate or lesions.Normal dentition.  Neck: normal, supple, no masses, no thyromegaly Respiratory: clear to auscultation bilaterally, no  wheezing, no crackles. Normal respiratory effort. No accessory muscle use.  Cardiovascular: Regular rate and rhythm, no murmurs / rubs / gallops. No extremity edema. 2+ pedal pulses. No carotid bruits.  Abdomen: no tenderness, no masses palpated. No hepatosplenomegaly. Bowel sounds positive.  Musculoskeletal: no clubbing / cyanosis. No joint deformity upper and lower extremities. Good ROM, no contractures. Normal muscle tone.  Skin: no rashes, lesions, ulcers. No induration Neurologic: CN 2-12 grossly intact. Sensation intact, DTR normal. Strength 5/5 in all 4.  Psychiatric: Normal judgment and insight. Alert and oriented x 3. Normal mood.    Labs on Admission: I have personally reviewed following labs and imaging studies  CBC: Recent Labs  Lab 03/16/20 1836 03/16/20 1859  WBC 11.6*  --   NEUTROABS 10.4*  --   HGB 11.3* 11.2*  HCT 35.9* 33.0*  MCV 100.3*  --   PLT 167  --    Basic Metabolic Panel: Recent Labs  Lab 03/16/20 1836 03/16/20 1859  NA 135 140  K 4.3 4.2  CL 106 107  CO2 18*  --   GLUCOSE 174* 174*  BUN 26* 28*  CREATININE 1.60* 1.50*  CALCIUM 9.0  --    GFR: CrCl cannot be calculated (Unknown ideal weight.). Liver Function Tests: Recent Labs  Lab 03/16/20 1836  AST 28  ALT 20  ALKPHOS 66  BILITOT 0.6  PROT 6.7  ALBUMIN 3.8   No results for input(s): LIPASE, AMYLASE in the last 168 hours. No results for input(s): AMMONIA in the last 168 hours. Coagulation Profile: No results for input(s): INR, PROTIME in the last 168 hours. Cardiac Enzymes: Recent Labs  Lab 03/16/20 1836  CKTOTAL 94   BNP (last 3 results) No results for input(s): PROBNP in the last 8760 hours. HbA1C: No results for input(s): HGBA1C in the last 72 hours. CBG: No results for input(s): GLUCAP in the last 168 hours. Lipid Profile: No results for input(s): CHOL, HDL, LDLCALC, TRIG, CHOLHDL, LDLDIRECT in the last 72 hours. Thyroid Function Tests: No results for input(s): TSH,  T4TOTAL, FREET4, T3FREE, THYROIDAB in the last 72 hours. Anemia Panel: No results for input(s): VITAMINB12, FOLATE, FERRITIN, TIBC, IRON, RETICCTPCT in the last 72 hours. Urine analysis:    Component Value Date/Time   COLORURINE YELLOW 03/16/2020 2249   APPEARANCEUR HAZY (A) 03/16/2020 2249   LABSPEC 1.014 03/16/2020 2249   PHURINE 7.0 03/16/2020 2249   GLUCOSEU NEGATIVE 03/16/2020 2249   HGBUR SMALL (A) 03/16/2020 2249   BILIRUBINUR NEGATIVE 03/16/2020 2249   [place] 03/16/2020  [place] (A) 03/16/2020 2249   NITRITE POSITIVE (A) 03/16/2020 2249   LEUKOCYTESUR LARGE (A) 03/16/2020 2249    Radiological Exams on Admission: CT Head Wo Contrast  Result Date: 03/16/2020 CLINICAL DATA:  Status post fall. EXAM: CT HEAD WITHOUT CONTRAST TECHNIQUE: Contiguous axial images were obtained from the base of the skull through the vertex without intravenous contrast. COMPARISON:  None. FINDINGS: Brain: There is mild cerebral atrophy with widening of the extra-axial spaces and ventricular dilatation. There are areas of decreased attenuation within the white matter tracts of the supratentorial brain, consistent with microvascular disease changes. Vascular: No hyperdense vessel or unexpected calcification. Skull: Normal. Negative for fracture or focal lesion. Sinuses/Orbits: No acute finding. Other: None. IMPRESSION: 1. Generalized cerebral atrophy. 2. No acute intracranial abnormality. Electronically Signed   By: Virgina Norfolk M.D.   On: 03/16/2020 19:34   CT Cervical Spine Wo Contrast  Result Date: 03/16/2020 CLINICAL DATA:  Status post fall. EXAM: CT CERVICAL SPINE WITHOUT CONTRAST TECHNIQUE: Multidetector CT imaging of the cervical spine was performed without intravenous contrast. Multiplanar CT image reconstructions were also generated. COMPARISON:  None. FINDINGS: Alignment: Normal. Skull base and vertebrae: No acute fracture. No primary bone lesion or focal pathologic process.  Soft tissues and spinal canal: No prevertebral fluid or swelling. No visible canal hematoma. Disc levels: Moderate to marked severity endplate sclerosis is seen at the levels of C3-C4, C4-C5, C5-C6 and C6-C7. Marked severity intervertebral disc space narrowing is also seen at these levels. There is moderate severity bilateral multilevel facet joint hypertrophy. Upper chest: Negative. Other: None. IMPRESSION: 1. No acute osseous abnormality. 2. Marked severity multilevel degenerative disc disease and facet joint hypertrophy. Electronically Signed   By: Virgina Norfolk M.D.   On: 03/16/2020 19:36   DG Chest Port 1 View  Result Date: 03/16/2020 CLINICAL DATA:  Status post fall with subsequent chest pain. EXAM: PORTABLE CHEST 1 VIEW COMPARISON:  March 20, 2019 FINDINGS: There is no evidence of acute infiltrate, pleural effusion or pneumothorax. The heart size and mediastinal contours are within normal limits. Marked severity degenerative changes seen involving both shoulders with multilevel degenerative changes noted throughout the thoracic spine. A small cortical irregularity is seen along the lateral aspect of the sixth left rib. Additional chronic sixth and seventh left rib fractures are noted. IMPRESSION: 1. No acute cardiopulmonary disease. 2. Chronic left-sided rib fractures with additional findings which may represent a nondisplaced acute fracture of the sixth left rib. Electronically Signed   By: Virgina Norfolk M.D.   On: 03/16/2020 19:38    EKG: Independently reviewed.  Assessment/Plan Principal Problem:   Sepsis secondary to UTI [facility]) Active Problems:   Prostate cancer ([place])   Demand ischemia ([place])    1. Sepsis secondary to UTI - 1. Got cefepime in ED 2. Put on rocephin 3. UCx pending 4. BCx pending 5. Foley in place 6. Checking renal US to r/o obstruction 7. IVF: 2L bolus in ED 8. Tylenol PRN fever 2. Demand ischemia - 1. Trending trops 2. Tele monitor 3. No CP 3. Prostate CA  - 1. Not on Xtandi any more it seems  DVT prophylaxis: Lovenox Code Status: Full Family Communication: No family in room Disposition Plan: Home after UTI resolved Consults called: None Admission status: Admit to inpatient  Severity of Illness: The appropriate patient status for this patient is INPATIENT. Inpatient status is judged to be reasonable and necessary in order to provide the required intensity of service to ensure  the patient's safety. The patient's presenting symptoms, physical exam findings, and initial radiographic and laboratory data in the context of their chronic comorbidities is felt to place them at high risk for further clinical deterioration. Furthermore, it is not anticipated that the patient will be medically stable for discharge from the hospital within 2 midnights of admission. The following factors support the patient status of inpatient.   IP status due to sepsis and associated demand ischemia.  * I certify that at the point of admission it is my clinical judgment that the patient will require inpatient hospital care spanning beyond 2 midnights from the point of admission due to high intensity of service, high risk for further deterioration and high frequency of surveillance required.*    Emarion Toral M. DO [facility]  How to contact the [facility] Attending or Consulting provider [place] or covering provider during after hours [place], for this patient?  1. Check the care team in [facility] and look for a) attending/consulting [facility] provider listed and b) the [facility] team listed 2. Log into [email or web address]  Amion Physician Scheduling and messaging for groups and whole hospitals  On call and physician scheduling software for group practices, residents, hospitalists and other medical providers for call, clinic, rotation and shift schedules. On[employer] is a hospital-wide system for scheduling doctors and paging doctors on call. EasyPlot is for scientific plotting and data  analysis.  [email or web address]  and use [place]'s universal password to access. If you do not have the password, please contact the hospital operator.  3. Locate the [facility] provider you are looking for under [facility] and page to a number that you can be directly reached. 4. If you still have difficulty reaching the provider, please page the [facility] (Director on Call) for the Hospitalists listed on amion for assistance.  03/16/2020, 11:59 PM

## 2020-03-16 NOTE — ED Notes (Signed)
Lorane Gell, wife, [phone] would like an update when available

## 2020-03-16 NOTE — ED Provider Notes (Signed)
[facility] EMERGENCY DEPARTMENT Provider Note   CSN: [id number] Arrival date & time: 03/16/20  1821     History Chief Complaint  Patient presents with  . Code STEMI    DEYVI BONANNO is a 86 y.o. male history of hypertension, prostate cancer here presenting with fall and weakness and chest pain.  Patient states that he was transferring from his chair and felt weak and fell face forward.  He states that he is wife called immediately.  EMS noticed that he was on the floor and was having some chest pain.  His EKG in the field showed STEMI in V1 and V2.  Patient states that he has not been on it for that long.  He states that he was given aspirin 324 mg by EMS.  He has no chest pain currently.  Patient is not on any blood thinners.  The history is provided by the patient.       Past Medical History:  Diagnosis Date  . Allergic rhinitis   . Arthritis   . Asthma    no inhalers  . Chronic left shoulder pain    AND DECREASE ROM--  END-STAGE OA  . Heart murmur   . History of cardiac murmur    ASYMPTOMATIC  . History of kidney stones   . History of urinary tract obstruction    BNC  . Hydronephrosis, left    possible distal stone  . Hypertension    no meds  pt. denies at preop  . Hypothyroidism   . OA (osteoarthritis)   . Organic impotence   . RBBB (right bundle branch block)   . 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 Urinary incontinence   . Wears glasses     Patient Active Problem List   Diagnosis Date Noted  . Acute renal insufficiency 05/02/2016  . Bladder stone 05/02/2016  . Pyonephrosis 05/01/2016  . Herpes simplex 08/04/2015  . Cancer associated pain 07/28/2015  . Dehydration 07/28/2015  . Hypoalbuminemia  due to protein-calorie malnutrition ([place]) 07/28/2015  . Hypertension 07/28/2015  . Ureteral stone 09/30/2013  . Extrinsic ureteral obstruction 09/30/2013  . Prostate cancer ([place]) 09/30/2013    Past Surgical History:  Procedure Laterality Date  . CYSTO/  UNROOFING RIGHT URETEROCELE AND STONE MANIPULATION  02-23-2011  . CYSTOSCOPY W/ URETERAL STENT PLACEMENT Left 05/01/2016   Procedure: CYSTOSCOPY WITH LEFT RETROGRADE PYELOGRAM/URETERAL LEFT STENT PLACEMENT;  Surgeon: [name], MD;  Location: [facility];  Service: Urology;  Laterality: Left;  . CYSTOSCOPY W/ URETERAL STENT PLACEMENT Left 05/18/2016   Procedure: CYSTOSCOPY WITH STENT REPLACEMENT;  Surgeon: [name], MD;  Location: [facility];  Service: Urology;  Laterality: Left;  . CYSTOSCOPY WITH LITHOLAPAXY N/A 05/18/2016   Procedure: CYSTOSCOPY WITH LITHOLAPAXY;  Surgeon: [name], MD;  Location[facility];  Service: Urology;  Laterality: N/A;  . cystoscopy with lithotripsy ereteroscopy/ hollium laser     06-27-18 Dr. Jeffie Pollock   . CYSTOSCOPY WITH URETEROSCOPY AND STENT PLACEMENT Left 09/30/2013   Procedure: LEFT CYSTOSCOPY WITH URETEROSCOPY, STONE EXTRACTION  AND STENT REPLACEMENT;  Surgeon: [name], MD;  Location: [facility];  Service: Urology;  Laterality: Left;  . CYSTOSCOPY WITH URETEROSCOPY AND STENT  PLACEMENT Left 05/18/2016   Procedure: CYSTOSCOPY WITH URETEROSCOPY;  Surgeon: [name], MD;  Location: [facility];  Service: Urology;  Laterality: Left;  . CYSTOSCOPY WITH URETEROSCOPY, STONE BASKETRY AND STENT PLACEMENT Left 07/08/2013   Procedure: LEFT  URETEROSCOPY,;  Surgeon: [name], MD;  Location: [facility];  Service: Urology;  Laterality: Left;  . CYSTOSCOPY/RETROGRADE/URETEROSCOPY/STONE EXTRACTION WITH BASKET N/A 12/05/2016   Procedure: CYSTOSCOPY AND FOLEY CATHERTER PLACEMENT;  Surgeon: [name], MD;  Location: [facility];  Service:  Urology;  Laterality: N/A;  . CYSTOSCOPY/URETEROSCOPY/HOLMIUM LASER/STENT PLACEMENT Right 06/27/2018   Procedure: CYSTOSCOPY/RIGHT URETEROSCOPY/ RIGHT RETROGRADE PYELOGRAM/ HOLMIUM LASER/ RIGHT STENT PLACEMENT;  Surgeon: [name], MD;  Location: [facility];  Service: Urology;  Laterality: Right;  . EXTRACORPOREAL SHOCK WAVE LITHOTRIPSY    . EXTRACORPOREAL SHOCK WAVE LITHOTRIPSY Left 07/11/2018   Procedure: EXTRACORPOREAL SHOCK WAVE LITHOTRIPSY (ESWL);  Surgeon: Cleon Gustin, MD;  Location: [facility];  Service: Urology;  Laterality: Left;  . EYE SURGERY Bilateral 09/22/13   cataract w/lens  . HOLMIUM LASER APPLICATION Left 05/01/6810   Procedure: HOLMIUM LASER APPLICATION;  Surgeon: [name], MD;  Location: [facility];  Service: Urology;  Laterality: Left;  . HOLMIUM LASER APPLICATION N/A 01/30/2619   Procedure: HOLMIUM LASER APPLICATION;  Surgeon: [name], MD;  Location: [facility];  Service: Urology;  Laterality: N/A;  . HOLMIUM LASER APPLICATION N/A 3/55/9741   Procedure: HOLMIUM LASER APPLICATION;  Surgeon: [name], MD;  Location: [facility];  Service: Urology;  Laterality: N/A;  . ORIF LEFT ANKLE FX  01-15-2002  . PERCUTANEOUS NEPHROSTOMY Left 07/16/13   removed 07/21/13  . RADIOACTIVE PROSTATE SEED IMPLANTS  1997  . STONE EXTRACTION WITH BASKET Left 05/18/2016   Procedure: STONE EXTRACTION WITH BASKET;  Surgeon: [name], MD;  Location: [facility];  Service: Urology;  Laterality: Left;  . TOTAL HIP ARTHROPLASTY Right 06-29-2009  . TRANSURETHRAL RESECTION OF BLADDER NECK N/A 07/08/2013   Procedure: TRANSURETHRAL RESECTION OF BLADDER NECK;  Surgeon: [name], MD;  Location: [facility];  Service: Urology;  Laterality: N/A;  resection of baldder neck  . URETERAL STENT PLACEMENT Left 07/21/13       History reviewed. No pertinent family history.  Social History   Tobacco Use  . Smoking status: Never Smoker  .  Smokeless tobacco: Never Used  Vaping Use  . Vaping Use: Never used  Substance Use Topics  . Alcohol use: No    Comment: 2 drinks a year  . Drug use: No    Home Medications Prior to Admission medications   Medication Sig Start Date End Date Taking? Authorizing Provider  albuterol (VENTOLIN HFA) 108 (90 Base) MCG/ACT inhaler Inhale 2 puffs into the lungs every 6 (six) hours as needed for wheezing or shortness of breath.   Yes [provider]  fexofenadine (ALLEGRA) 180 MG tablet Take 180 mg by mouth daily.   Yes [provider]  fluticasone (FLONASE) 50 MCG/ACT nasal spray Place 2 sprays into both nostrils daily as needed for allergies or rhinitis.   Yes [provider]  levothyroxine (SYNTHROID, LEVOTHROID) 150 MCG tablet Take 150 mcg by mouth daily before breakfast.   Yes [provider]  meloxicam (MOBIC) 15 MG tablet Take 15 mg by mouth daily.   Yes [provider]  Multiple Vitamin (MULTIVITAMIN WITH MINERALS) TABS tablet Take 1 tablet by mouth 2 (two) times a week.    Yes  [provider]  MYRBETRIQ 50 MG TB24 tablet Take 50 mg by mouth daily.  08/31/15  Yes [provider]  oxyCODONE-acetaminophen (PERCOCET/ROXICET) 5-325 MG tablet Take 1 tablet by mouth every 6 (six) hours as needed (for pain).  06/25/19  Yes [provider]  trimethoprim (TRIMPEX) 100 MG tablet Take 100 mg by mouth at bedtime.   Yes [provider]  diazepam (VALIUM) 10 MG tablet Take 10 mg by mouth once as needed (one hour prior to procedure).  09/17/19   [provider]  enzalutamide Gillermina Phy) 40 MG capsule Take 4 capsules (160 mg total) by mouth daily. Patient not taking: Reported on 03/16/2020 12/03/19   Wyatt Portela, MD  levofloxacin (LEVAQUIN) 500 MG tablet Take 500 mg by mouth daily. Patient not taking: Reported on 03/16/2020 07/10/18   [provider]    Allergies    Patient has no known allergies.  Review of  Systems   Review of Systems  Cardiovascular: Positive for chest pain.  Neurological: Positive for dizziness.  All other systems reviewed and are negative.   Physical Exam Updated Vital Signs BP 109/65   Pulse 74   Temp (!) 101 F (38.3 C) (Rectal)   Resp 16   SpO2 97%   Physical Exam Vitals and nursing note reviewed.  HENT:     Head:     Comments: Abrasion R forehead     Nose: Nose normal.     Mouth/Throat:     Mouth: Mucous membranes are moist.  Eyes:     Extraocular Movements: Extraocular movements intact.     Pupils: Pupils are equal, round, and reactive to light.  Cardiovascular:     Rate and Rhythm: Normal rate and regular rhythm.     Pulses: Normal pulses.     Heart sounds: Normal heart sounds.  Pulmonary:     Effort: Pulmonary effort is normal.     Breath sounds: Normal breath sounds.  Abdominal:     General: Abdomen is flat.     Palpations: Abdomen is soft.  Musculoskeletal:        General: Normal range of motion.     Cervical back: Normal range of motion.     Comments: Abrasion R knee with nl ROM and no obvious deformity   Skin:    General: Skin is warm.     Capillary Refill: Capillary refill takes less than 2 seconds.  Neurological:     General: No focal deficit present.     Mental Status: He is alert.  Psychiatric:        Mood and Affect: Mood normal.     ED Results / Procedures / Treatments   Labs (all labs ordered are listed, but only abnormal results are displayed) Labs Reviewed  CBC WITH DIFFERENTIAL/PLATELET - Abnormal; Notable for the following components:      Result Value   WBC 11.6 (*)    RBC 3.58 (*)    Hemoglobin 11.3 (*)    HCT 35.9 (*)    MCV 100.3 (*)    Neutro Abs 10.4 (*)    Lymphs Abs 0.3 (*)    Abs Immature Granulocytes 0.10 (*)    All other components within normal limits  COMPREHENSIVE METABOLIC PANEL - Abnormal; Notable for the following components:   CO2 18 (*)    Glucose, Bld 174 (*)    BUN 26 (*)    Creatinine,  Ser 1.60 (*)    GFR calc non Af Amer 38 (*)  GFR calc Af Amer 45 (*)    All other components within normal limits  LACTIC ACID, PLASMA - Abnormal; Notable for the following components:   Lactic Acid, Venous 3.3 (*)    All other components within normal limits  LACTIC ACID, PLASMA - Abnormal; Notable for the following components:   Lactic Acid, Venous 2.3 (*)    All other components within normal limits  URINALYSIS, ROUTINE W REFLEX MICROSCOPIC - Abnormal; Notable for the following components:   APPearance HAZY (*)    Hgb urine dipstick SMALL (*)    Protein, ur 30 (*)    Nitrite POSITIVE (*)    Leukocytes,Ua LARGE (*)    Bacteria, UA FEW (*)    All other components within normal limits  I-STAT CHEM 8, ED - Abnormal; Notable for the following components:   BUN 28 (*)    Creatinine, Ser 1.50 (*)    Glucose, Bld 174 (*)    TCO2 21 (*)    Hemoglobin 11.2 (*)    HCT 33.0 (*)    All other components within normal limits  TROPONIN I (HIGH SENSITIVITY) - Abnormal; Notable for the following components:   Troponin I (High Sensitivity) 43 (*)    All other components within normal limits  TROPONIN I (HIGH SENSITIVITY) - Abnormal; Notable for the following components:   Troponin I (High Sensitivity) 124 (*)    All other components within normal limits  SARS CORONAVIRUS 2 BY RT PCR (HOSPITAL ORDER, [facility])  CULTURE, BLOOD (ROUTINE X 2)  CULTURE, BLOOD (ROUTINE X 2)  CK    EKG EKG Interpretation  Date/Time:  Tuesday March 16 2020 22:21:16 EDT Ventricular Rate:  75 PR Interval:    QRS Duration: 148 QT Interval:  425 QTC Calculation: 475 R Axis:   -72 Text Interpretation: Sinus rhythm Right bundle branch block Probable anteroseptal infarct, acute unchanged from previous Confirmed by Wandra Arthurs [phone]) on 03/16/2020 10:27:02 PM   Radiology CT Head Wo Contrast  Result Date: 03/16/2020 CLINICAL DATA:  Status post fall. EXAM: CT HEAD WITHOUT CONTRAST  TECHNIQUE: Contiguous axial images were obtained from the base of the skull through the vertex without intravenous contrast. COMPARISON:  None. FINDINGS: Brain: There is mild cerebral atrophy with widening of the extra-axial spaces and ventricular dilatation. There are areas of decreased attenuation within the white matter tracts of the supratentorial brain, consistent with microvascular disease changes. Vascular: No hyperdense vessel or unexpected calcification. Skull: Normal. Negative for fracture or focal lesion. Sinuses/Orbits: No acute finding. Other: None. IMPRESSION: 1. Generalized cerebral atrophy. 2. No acute intracranial abnormality. Electronically Signed   By: Virgina Norfolk M.D.   On: 03/16/2020 19:34   CT Cervical Spine Wo Contrast  Result Date: 03/16/2020 CLINICAL DATA:  Status post fall. EXAM: CT CERVICAL SPINE WITHOUT CONTRAST TECHNIQUE: Multidetector CT imaging of the cervical spine was performed without intravenous contrast. Multiplanar CT image reconstructions were also generated. COMPARISON:  None. FINDINGS: Alignment: Normal. Skull base and vertebrae: No acute fracture. No primary bone lesion or focal pathologic process. Soft tissues and spinal canal: No prevertebral fluid or swelling. No visible canal hematoma. Disc levels: Moderate to marked severity endplate sclerosis is seen at the levels of C3-C4, C4-C5, C5-C6 and C6-C7. Marked severity intervertebral disc space narrowing is also seen at these levels. There is moderate severity bilateral multilevel facet joint hypertrophy. Upper chest: Negative. Other: None. IMPRESSION: 1. No acute osseous abnormality. 2. Marked severity multilevel degenerative disc disease  and facet joint hypertrophy. Electronically Signed   By: Virgina Norfolk M.D.   On: 03/16/2020 19:36   DG Chest Port 1 View  Result Date: 03/16/2020 CLINICAL DATA:  Status post fall with subsequent chest pain. EXAM: PORTABLE CHEST 1 VIEW COMPARISON:  March 20, 2019 FINDINGS:  There is no evidence of acute infiltrate, pleural effusion or pneumothorax. The heart size and mediastinal contours are within normal limits. Marked severity degenerative changes seen involving both shoulders with multilevel degenerative changes noted throughout the thoracic spine. A small cortical irregularity is seen along the lateral aspect of the sixth left rib. Additional chronic sixth and seventh left rib fractures are noted. IMPRESSION: 1. No acute cardiopulmonary disease. 2. Chronic left-sided rib fractures with additional findings which may represent a nondisplaced acute fracture of the sixth left rib. Electronically Signed   By: Virgina Norfolk M.D.   On: 03/16/2020 19:38    Procedures Procedures (including critical care time)  CRITICAL CARE Performed by: Wandra Arthurs   Total critical care time: 30 minutes  Critical care time was exclusive of separately billable procedures and treating other patients.  Critical care was necessary to treat or prevent imminent or life-threatening deterioration.  Critical care was time spent personally by me on the following activities: development of treatment plan with patient and/or surrogate as well as nursing, discussions with consultants, evaluation of patient's response to treatment, examination of patient, obtaining history from patient or surrogate, ordering and performing treatments and interventions, ordering and review of laboratory studies, ordering and review of radiographic studies, pulse oximetry and re-evaluation of patient's condition.   Medications Ordered in ED Medications  vancomycin (VANCOCIN) IVPB 1000 mg/200 mL premix (has no administration in time range)  sodium chloride 0.9 % bolus 1,000 mL (0 mLs Intravenous Stopped 03/16/20 2047)  acetaminophen (TYLENOL) tablet 650 mg (650 mg Oral Given 03/16/20 1924)  sodium chloride 0.9 % bolus 1,000 mL (1,000 mLs Intravenous Bolus from Bag 03/16/20 2047)  ceFEPIme (MAXIPIME) 1 g in sodium  chloride 0.9 % 100 mL IVPB (1 g Intravenous New Bag/Given 03/16/20 2251)    ED Course  I have reviewed the triage vital signs and the nursing notes.  Pertinent labs & imaging results that were available during my care of the patient were reviewed by me and considered in my medical decision making (see chart for details).    MDM Rules/Calculators/A&P                          HOUSTON ZAPIEN is a 86 y.o. male who presented with fall and chest pain.  He does have a EKG that shows STEMI in V1 and V2.  Patient also has low-grade temperature 100.5.  I am unclear how long he has been on the floor for so consider rhabdomyolysis versus STEMI versus head bleed.  Patient will be taken emergently to the Cath Lab.  Will get CT head and neck to rule out traumatic causes.  Also will get CBC, CMP, lactate, cxr.   11:23 PM Second troponin trended up to 124.  His EKG remained unchanged.  I talked to cardiology (Dr. Vickki Muff) who saw patient.  He thought this is secondary to his underlying sepsis.  He recommend treating sepsis and hold heparin for now.  Patient's white blood count is 12.  His initial lactate is 3.3 and went down to 2.3.  His urinalysis positive for UTI.  He received broad-spectrum antibiotic with Vanc and cefepime. Patient  will be admitted by hospitalist service for sepsis, elevated troponin.   Final Clinical Impression(s) / ED Diagnoses Final diagnoses:  None    Rx / DC Orders ED Discharge Orders    None       Drenda Freeze, MD 03/16/20 2325

## 2020-03-16 NOTE — ED Triage Notes (Signed)
Pt BIB [facility] as Code Stemi. EMS was initially called out for a fall. Pt stated he was trying to move from one chair to another when he fell. Pt has obvious bruising to the right side of the forehead and an abrasion to the right knee. Pt denies taking any blood thinners. Pt denies any pain. EMS obtained a 12 lead EKG with significant changes and activated a Code Stemi in the field.

## 2020-03-17 ENCOUNTER — Other Ambulatory Visit (HOSPITAL_COMMUNITY): Payer: Medicare HMO

## 2020-03-17 ENCOUNTER — Inpatient Hospital Stay (HOSPITAL_COMMUNITY): Payer: Medicare HMO

## 2020-03-17 DIAGNOSIS — R6520 Severe sepsis without septic shock: Secondary | ICD-10-CM

## 2020-03-17 DIAGNOSIS — N390 Urinary tract infection, site not specified: Secondary | ICD-10-CM

## 2020-03-17 DIAGNOSIS — R079 Chest pain, unspecified: Secondary | ICD-10-CM

## 2020-03-17 DIAGNOSIS — R778 Other specified abnormalities of plasma proteins: Secondary | ICD-10-CM | POA: Insufficient documentation

## 2020-03-17 DIAGNOSIS — A419 Sepsis, unspecified organism: Principal | ICD-10-CM

## 2020-03-17 LAB — LACTIC ACID, PLASMA
Lactic Acid, Venous: 1.1 mmol/L (ref 0.5–1.9)
Lactic Acid, Venous: 1.9 mmol/L (ref 0.5–1.9)

## 2020-03-17 LAB — BASIC METABOLIC PANEL WITH GFR
Anion gap: 7 (ref 5–15)
BUN: 25 mg/dL — ABNORMAL HIGH (ref 8–23)
CO2: 22 mmol/L (ref 22–32)
Calcium: 8.6 mg/dL — ABNORMAL LOW (ref 8.9–10.3)
Chloride: 111 mmol/L (ref 98–111)
Creatinine, Ser: 1.32 mg/dL — ABNORMAL HIGH (ref 0.61–1.24)
GFR calc Af Amer: 56 mL/min — ABNORMAL LOW
GFR calc non Af Amer: 49 mL/min — ABNORMAL LOW
Glucose, Bld: 120 mg/dL — ABNORMAL HIGH (ref 70–99)
Potassium: 4.7 mmol/L (ref 3.5–5.1)
Sodium: 140 mmol/L (ref 135–145)

## 2020-03-17 LAB — PROCALCITONIN: Procalcitonin: 1.71 ng/mL

## 2020-03-17 LAB — CBC
HCT: 31.1 % — ABNORMAL LOW (ref 39.0–52.0)
Hemoglobin: 9.9 g/dL — ABNORMAL LOW (ref 13.0–17.0)
MCH: 32.1 pg (ref 26.0–34.0)
MCHC: 31.8 g/dL (ref 30.0–36.0)
MCV: 101 fL — ABNORMAL HIGH (ref 80.0–100.0)
Platelets: 144 K/uL — ABNORMAL LOW (ref 150–400)
RBC: 3.08 MIL/uL — ABNORMAL LOW (ref 4.22–5.81)
RDW: 13.5 % (ref 11.5–15.5)
WBC: 8.4 K/uL (ref 4.0–10.5)
nRBC: 0 % (ref 0.0–0.2)

## 2020-03-17 LAB — ECHOCARDIOGRAM COMPLETE

## 2020-03-17 LAB — TROPONIN I (HIGH SENSITIVITY)
Troponin I (High Sensitivity): 152 ng/L (ref <18)
Troponin I (High Sensitivity): 143 ng/L

## 2020-03-17 MED ORDER — SODIUM CHLORIDE 0.9 % IV BOLUS
1000.0000 mL | INTRAVENOUS | Status: DC
  Administered 2020-03-17 (×2): 1000 mL via INTRAVENOUS

## 2020-03-17 MED ORDER — POLYETHYLENE GLYCOL 3350 17 G PO PACK
17.0000 g | PACK | Freq: Every day | ORAL | Status: DC | PRN
  Administered 2020-03-20: 17 g via ORAL
  Filled 2020-03-17: qty 1

## 2020-03-17 NOTE — Progress Notes (Signed)
   Consult note overnight reviewed -echo personally reviewed, LV function is preserved without new wall motion abnormalities - moderate MAC. Troponin flat elevated, probably related to sepsis. Cardiology will follow-up tomorrow.  Pixie Casino, MD, [facility], [place] Director of the Advanced Lipid Disorders &  Cardiovascular Risk Reduction Clinic Diplomate of the American Board of Clinical Lipidology Attending Cardiologist  Direct Dial: [phone]  Fax: [phone]  Website:  [email or web address]

## 2020-03-17 NOTE — Progress Notes (Signed)
PROGRESS NOTE    [name] [name]  GHW:[id number] DOB: [birth year] DOA: 03/16/2020 PCP: Maury Dus, MD     Brief Narrative:  86 y.o. WM PMHx HTN, RBBB, prostate CA. nephrolithiasis, Hx urinary tract obstruction, hypothyroidism,  Pt presents to ED with c/o generalized weakness.  Patient states that he was transferring from his chair and felt weak and fell face forward. He states that he is wife called immediately. EMS noticed that he was on the floor and was having some chest pain. His EKG in the field showed STEMI in V1 and V2. Patient states that he has not been on it for that long. He states that he was given aspirin 324 mg by EMS. He has no chest pain currently. Patient is not on any blood thinners.   ED Course: Cards saw pt: not a STEMI.  Found to have Tm 101.  Found to have UTI.  WBC 11.6.  Lactate 3.3.  Creat 1.6, slightly up from baseline of 1.35 earlier this year.  Trops were 43 and 124.  Cards feels that this is demand ischemia, pt remains CP free.    Subjective: T-max last 24 hours 38.3 C, negative CP, negative S OB, negative N /V, negative abdominal pain.   Assessment & Plan: Covid vaccination; positive vaccination     Principal Problem:   Sepsis secondary to UTI [facility]) Active Problems:   Prostate cancer ([place])   Demand ischemia ([place])   Sepsis UTI/severe sepsis -On admission met criteria for sepsis temp> 38 C, HR> 90 bpm, RR> 20; lactic acid> 2 -Elevated procalcitonin 1. 7 1, will complete 5-day course antibiotics. -Cultures pending -Continue normal saline 110m/hr -Strict in and out -Daily weight  Demand ischemia -Trend troponins -Echocardiogram pending  Prostate cancer -Currently not on any treatment.    DVT prophylaxis: Lovenox Code Status: Full Family Communication: 6/23 wife at bedside for discussion of plan of care Status is: Inpatient    Dispo: The patient is from: Home              Anticipated d/c is to: Home               Anticipated d/c date is: 6/28              Patient currently unstable      Consultants:    Procedures/Significant Events:  6/23 echocardiogram pending    I have personally reviewed and interpreted all radiology studies and my findings are as above.  VENTILATOR SETTINGS:    Cultures 6/22 SARS coronavirus negative  Antimicrobials: Anti-infectives (From admission, onward)   Start     Dose/Rate Stop   03/17/20 0600  cefTRIAXone (ROCEPHIN) 1 g in sodium chloride 0.9 % 100 mL IVPB     Discontinue     1 g 200 mL/hr over 30 Minutes     03/16/20 2300  ceFEPIme (MAXIPIME) 1 g in sodium chloride 0.9 % 100 mL IVPB        1 g 200 mL/hr over 30 Minutes 03/17/20 0007   03/16/20 2230  vancomycin (VANCOCIN) IVPB 1000 mg/200 mL premix  Status:  Discontinued        1,000 mg 200 mL/hr over 60 Minutes 03/16/20 2349       Devices    LINES / TUBES:      Continuous Infusions: . cefTRIAXone (ROCEPHIN)  IV 1 g (03/17/20 0702)     Objective: Vitals:   03/17/20 0230 03/17/20 0434 03/17/20 0645 03/17/20 0700  BP: 129/70 134/71 136/79 (Marland Kitchen  142/77  Pulse: 64 72 63 63  Resp: '18 20 20 17  '$ Temp:      TempSrc:      SpO2: 97% 99% 98% 97%    Intake/Output Summary (Last 24 hours) at 03/17/2020 0751 Last data filed at 03/17/2020 0007 Gross per 24 hour  Intake 2100 ml  Output --  Net 2100 ml   There were no vitals filed for this visit.  Examination:  General: A/O x4, no acute respiratory distress Eyes: negative scleral hemorrhage, negative anisocoria, negative icterus ENT: Negative Runny nose, negative gingival bleeding, Neck:  Negative scars, masses, torticollis, lymphadenopathy, JVD Lungs: Clear to auscultation bilaterally without wheezes or crackles Cardiovascular: Regular rate and rhythm without murmur gallop or rub normal S1 and S2 Abdomen: MORBIDLY OBESE, negative abdominal pain, nondistended, positive soft, bowel sounds, no rebound, no ascites, no appreciable mass,  RIGHT CVA tenderness to palpation Extremities: No significant cyanosis, clubbing, or edema bilateral lower extremities Skin: Negative rashes, lesions, ulcers Psychiatric:  Negative depression, negative anxiety, negative fatigue, negative mania  Central nervous system:  Cranial nerves II through XII intact, tongue/uvula midline, all extremities muscle strength 5/5, sensation intact throughout,  negative dysarthria, negative expressive aphasia, negative receptive aphasia.  .     Data Reviewed: Care during the described time interval was provided by me .  I have reviewed this patient's available data, including medical history, events of note, physical examination, and all test results as part of my evaluation.  CBC: Recent Labs  Lab 03/16/20 1836 03/16/20 1859 03/17/20 0253  WBC 11.6*  --  8.4  NEUTROABS 10.4*  --   --   HGB 11.3* 11.2* 9.9*  HCT 35.9* 33.0* 31.1*  MCV 100.3*  --  101.0*  PLT 167  --  710*   Basic Metabolic Panel: Recent Labs  Lab 03/16/20 1836 03/16/20 1859 03/17/20 0253  NA 135 140 140  K 4.3 4.2 4.7  CL 106 107 111  CO2 18*  --  22  GLUCOSE 174* 174* 120*  BUN 26* 28* 25*  CREATININE 1.60* 1.50* 1.32*  CALCIUM 9.0  --  8.6*   GFR: CrCl cannot be calculated (Unknown ideal weight.). Liver Function Tests: Recent Labs  Lab 03/16/20 1836  AST 28  ALT 20  ALKPHOS 66  BILITOT 0.6  PROT 6.7  ALBUMIN 3.8   No results for input(s): LIPASE, AMYLASE in the last 168 hours. No results for input(s): AMMONIA in the last 168 hours. Coagulation Profile: No results for input(s): INR, PROTIME in the last 168 hours. Cardiac Enzymes: Recent Labs  Lab 03/16/20 1836  CKTOTAL 94   BNP (last 3 results) No results for input(s): PROBNP in the last 8760 hours. HbA1C: No results for input(s): HGBA1C in the last 72 hours. CBG: No results for input(s): GLUCAP in the last 168 hours. Lipid Profile: No results for input(s): CHOL, HDL, LDLCALC, TRIG, CHOLHDL,  LDLDIRECT in the last 72 hours. Thyroid Function Tests: No results for input(s): TSH, T4TOTAL, FREET4, T3FREE, THYROIDAB in the last 72 hours. Anemia Panel: No results for input(s): VITAMINB12, FOLATE, FERRITIN, TIBC, IRON, RETICCTPCT in the last 72 hours. Sepsis Labs: Recent Labs  Lab 03/16/20 1855 03/16/20 2049  LATICACIDVEN 3.3* 2.3*    Recent Results (from the past 240 hour(s))  SARS Coronavirus 2 by RT PCR (hospital order, performed in [facility] hospital lab) Nasopharyngeal Nasopharyngeal Swab     Status: None   Collection Time: 03/16/20  6:38 PM   Specimen: Nasopharyngeal Swab  Result  Value Ref Range Status   SARS Coronavirus 2 NEGATIVE NEGATIVE Final    Comment: (NOTE) SARS-CoV-2 target nucleic acids are NOT DETECTED.  The SARS-CoV-2 RNA is generally detectable in upper and lower respiratory specimens during the acute phase of infection. The lowest concentration of SARS-CoV-2 viral copies this assay can detect is 250 copies / mL. A negative result does not preclude SARS-CoV-2 infection and should not be used as the sole basis for treatment or other patient management decisions.  A negative result may occur with improper specimen collection / handling, submission of specimen other than nasopharyngeal swab, presence of viral mutation(s) within the areas targeted by this assay, and inadequate number of viral copies (<250 copies / mL). A negative result must be combined with clinical observations, patient history, and epidemiological information.  Fact Sheet for Patients:   StrictlyIdeas.no  Fact Sheet for Healthcare Providers: BankingDealers.co.za  This test is not yet approved or  cleared by the [place] FDA and has been authorized for detection and/or diagnosis of SARS-CoV-2 by FDA under an Emergency Use Authorization (EUA).  This EUA will remain in effect (meaning this test can be used) for the duration of  the COVID-19 declaration under Section 564(b)(1) of the Act, 21 U.S.C. section 360bbb-3(b)(1), unless the authorization is terminated or revoked sooner.  Performed at [facility], [place] [address] [place], [place] [address]   Blood culture (routine x 2)     Status: None (Preliminary result)   Collection Time: 03/16/20  6:55 PM   Specimen: BLOOD  Result Value Ref Range Status   Specimen Description BLOOD SITE NOT SPECIFIED  Final   Special Requests   Final    BOTTLES DRAWN AEROBIC AND ANAEROBIC Blood Culture adequate volume   Culture   Final    NO GROWTH < 12 HOURS Performed at [facility], [place] [address] [place], [place] [address]    Report Status PENDING  Incomplete  Blood culture (routine x 2)     Status: None (Preliminary result)   Collection Time: 03/16/20  7:03 PM   Specimen: BLOOD  Result Value Ref Range Status   Specimen Description BLOOD RIGHT ANTECUBITAL  Final   Special Requests   Final    BOTTLES DRAWN AEROBIC AND ANAEROBIC Blood Culture adequate volume   Culture   Final    NO GROWTH < 12 HOURS Performed at [facility], [place] [address] [place],  [address]    Report Status PENDING  Incomplete         Radiology Studies: CT Head Wo Contrast  Result Date: 03/16/2020 CLINICAL DATA:  Status post fall. EXAM: CT HEAD WITHOUT CONTRAST TECHNIQUE: Contiguous axial images were obtained from the base of the skull through the vertex without intravenous contrast. COMPARISON:  None. FINDINGS: Brain: There is mild cerebral atrophy with widening of the extra-axial spaces and ventricular dilatation. There are areas of decreased attenuation within the white matter tracts of the supratentorial brain, consistent with microvascular disease changes. Vascular: No hyperdense vessel or unexpected calcification. Skull: Normal. Negative for fracture or focal lesion. Sinuses/Orbits: No acute finding. Other: None. IMPRESSION: 1. Generalized cerebral atrophy. 2. No  acute intracranial abnormality. Electronically Signed   By: Virgina Norfolk M.D.   On: 03/16/2020 19:34   CT Cervical Spine Wo Contrast  Result Date: 03/16/2020 CLINICAL DATA:  Status post fall. EXAM: CT CERVICAL SPINE WITHOUT CONTRAST TECHNIQUE: Multidetector CT imaging of the cervical spine was performed without intravenous contrast. Multiplanar CT  image reconstructions were also generated. COMPARISON:  None. FINDINGS: Alignment: Normal. Skull base and vertebrae: No acute fracture. No primary bone lesion or focal pathologic process. Soft tissues and spinal canal: No prevertebral fluid or swelling. No visible canal hematoma. Disc levels: Moderate to marked severity endplate sclerosis is seen at the levels of C3-C4, C4-C5, C5-C6 and C6-C7. Marked severity intervertebral disc space narrowing is also seen at these levels. There is moderate severity bilateral multilevel facet joint hypertrophy. Upper chest: Negative. Other: None. IMPRESSION: 1. No acute osseous abnormality. 2. Marked severity multilevel degenerative disc disease and facet joint hypertrophy. Electronically Signed   By: Virgina Norfolk M.D.   On: 03/16/2020 19:36   US RENAL  Result Date: 03/17/2020 CLINICAL DATA:  UTI/sepsis EXAM: RENAL / URINARY TRACT ULTRASOUND COMPLETE COMPARISON:  CT 09/23/2019, ultrasound 12/04/2018 FINDINGS: Right Kidney: Renal measurements: 10.5 x 5.9 x 4.9 cm = volume: 159 mL. Diffusely increased renal cortical echogenicity. Moderate right hydronephrosis with central and peripheral calyceal dilatation. No visible shadowing calculus or worrisome renal lesion. Previously seen cysts not well visualized sonographically. Left Kidney: Renal measurements: 10 x 5.7 x 4.2 cm = volume: 124.5 mL. Diffusely increased renal cortical echogenicity. No shadowing calculus, hydronephrosis or concerning renal mass. Previously seen cysts not well visualized sonographically. Bladder: Appears normal for degree of bladder distention. Other:  Technically difficult exam due to patient body habitus and inability to reposition patient. IMPRESSION: Technically challenging exam due to inability to reposition patient and patient's body habitus. Increased renal cortical echogenicity compatible with medical renal disease. Moderate right hydronephrosis without visible shadowing calculus. Distal calculus is not excluded. Electronically Signed   By: Lovena Le M.D.   On: 03/17/2020 00:32   DG Chest Port 1 View  Result Date: 03/16/2020 CLINICAL DATA:  Status post fall with subsequent chest pain. EXAM: PORTABLE CHEST 1 VIEW COMPARISON:  March 20, 2019 FINDINGS: There is no evidence of acute infiltrate, pleural effusion or pneumothorax. The heart size and mediastinal contours are within normal limits. Marked severity degenerative changes seen involving both shoulders with multilevel degenerative changes noted throughout the thoracic spine. A small cortical irregularity is seen along the lateral aspect of the sixth left rib. Additional chronic sixth and seventh left rib fractures are noted. IMPRESSION: 1. No acute cardiopulmonary disease. 2. Chronic left-sided rib fractures with additional findings which may represent a nondisplaced acute fracture of the sixth left rib. Electronically Signed   By: Virgina Norfolk M.D.   On: 03/16/2020 19:38        Scheduled Meds: . enoxaparin (LOVENOX) injection  40 mg Subcutaneous QHS  . levothyroxine  150 mcg Oral QAC breakfast  . loratadine  10 mg Oral Daily  . meloxicam  15 mg Oral Daily  . mirabegron ER  50 mg Oral Daily  . [START ON 03/18/2020] multivitamin with minerals  1 tablet Oral Once per day on Mon Thu   Continuous Infusions: . cefTRIAXone (ROCEPHIN)  IV 1 g (03/17/20 0702)     LOS: 1 day    Time spent:40 min    Pallie Swigert, Geraldo Docker, MD [facility] Pager [phone]  If 7PM-7AM, please contact night-coverage [email or web address] Password [facility] 03/17/2020, 7:51 AM

## 2020-03-17 NOTE — ED Notes (Signed)
Repositioned in the bed

## 2020-03-17 NOTE — ED Notes (Signed)
The pt was incontinent of urine  Pt cleaned  And bed linen changed  Pt alert no distress he c.o pain whenever he moves  Wife just returned

## 2020-03-17 NOTE — ED Notes (Signed)
Breakfast Ordered 

## 2020-03-17 NOTE — ED Notes (Signed)
Report given to RN Janet

## 2020-03-17 NOTE — ED Notes (Signed)
Report given to rn on 3e 

## 2020-03-17 NOTE — Consult Note (Signed)
CARDIOLOGY CONSULT NOTE  Patient ID: [name] [name] MRN: [id number] DOB/AGE: 1932/05/22 86 y.o.  Admit date: 03/16/2020 Primary Cardiologist: None Chief Complaint: Generalized weakness Requesting: ED evaluation for elevated troponin  HPI:   [name] [name] is a 86 y.o. male with a documented history of HTN, known right bundle branch block, prostate CA, and hypothyroidism who presented to the ED via EMS with a chief complaint of generalized weakness and chills.  Cardiology is now consulted for evaluation of elevated troponin with an abnormal ECG.  Patient is accompanied by his wife who provides collateral.  He is reportedly in his normal state of health until the afternoon of admission when he developed acute onset sensation of chills while watching TV.  He was also notedto be very tremulous by his wife with significant difficulty transferring from his chair to the commode which is not his baseline.  Patient reports that he felt very weak and actually fell face forward with significant difficulty getting up requiring help so EMS was called.  Denies any lapse of consciousness, palpitations, lightheadedness/dizziness, or shortness of breath.  He reports that he did have some back pain and some mild, transient chest pain but did not experience any other associated symptoms.  EKG performed in the field by EMS showed findings suspicious for ST elevations in the anterior precordial leads and so code STEMI was activated.  He was given full dose aspirin and brought to the ED for evaluation where he was seen by interventional cardiology who determined that his ECG findings were actually stable and more consistent with Brugada pattern, especially in the setting of his active fever (Tmax 101F).  Code STEMI was deactivated and labs were obtained which were notable for a mild leukocytosis, lactate 3.3, nitrite positive urinalysis, and elevated high-sensitivity troponin (43- 124). At the time my interview,  patient denied any chest pain and was resting comfortably in bed.     Past Medical History:  Diagnosis Date  . Allergic rhinitis   . Arthritis   . Asthma    no inhalers  . Chronic left shoulder pain    AND DECREASE ROM--  END-STAGE OA  . Heart murmur   . History of cardiac murmur    ASYMPTOMATIC  . History of kidney stones   . History of urinary tract obstruction    BNC  . Hydronephrosis, left    possible distal stone  . Hypertension    no meds  pt. denies at preop  . Hypothyroidism   . OA (osteoarthritis)   . Organic impotence   . RBBB (right bundle branch block)   . 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 Urinary incontinence   . Wears glasses     Past Surgical History:  Procedure Laterality Date  . CYSTO/  UNROOFING RIGHT URETEROCELE AND STONE MANIPULATION  02-23-2011  . CYSTOSCOPY W/ URETERAL STENT PLACEMENT Left 05/01/2016   Procedure: CYSTOSCOPY WITH LEFT RETROGRADE PYELOGRAM/URETERAL LEFT STENT PLACEMENT;  Surgeon: [name], MD;  Location: [facility];  Service: Urology;  Laterality: Left;  . CYSTOSCOPY W/ URETERAL STENT PLACEMENT Left 05/18/2016   Procedure: CYSTOSCOPY WITH STENT REPLACEMENT;  Surgeon: [name], MD;  Location: [facility];  Service: Urology;  Laterality: Left;  .  CYSTOSCOPY WITH LITHOLAPAXY N/A 05/18/2016   Procedure: CYSTOSCOPY WITH LITHOLAPAXY;  Surgeon: [name], MD;  Location: [facility];  Service: Urology;  Laterality: N/A;  . cystoscopy with lithotripsy ereteroscopy/ hollium laser     06-27-18 Dr. Jeffie Pollock   . CYSTOSCOPY WITH URETEROSCOPY AND STENT PLACEMENT Left 09/30/2013   Procedure: LEFT CYSTOSCOPY WITH URETEROSCOPY, STONE EXTRACTION  AND STENT REPLACEMENT;  Surgeon: [name], MD;   Location: [facility];  Service: Urology;  Laterality: Left;  . CYSTOSCOPY WITH URETEROSCOPY AND STENT PLACEMENT Left 05/18/2016   Procedure: CYSTOSCOPY WITH URETEROSCOPY;  Surgeon: [name], MD;  Location: [facility];  Service: Urology;  Laterality: Left;  . CYSTOSCOPY WITH URETEROSCOPY, STONE BASKETRY AND STENT PLACEMENT Left 07/08/2013   Procedure: LEFT  URETEROSCOPY,;  Surgeon: [name], MD;  Location: [facility];  Service: Urology;  Laterality: Left;  . CYSTOSCOPY/RETROGRADE/URETEROSCOPY/STONE EXTRACTION WITH BASKET N/A 12/05/2016   Procedure: CYSTOSCOPY AND FOLEY CATHERTER PLACEMENT;  Surgeon: [name], MD;  Location: [facility];  Service: Urology;  Laterality: N/A;  . CYSTOSCOPY/URETEROSCOPY/HOLMIUM LASER/STENT PLACEMENT Right 06/27/2018   Procedure: CYSTOSCOPY/RIGHT URETEROSCOPY/ RIGHT RETROGRADE PYELOGRAM/ HOLMIUM LASER/ RIGHT STENT PLACEMENT;  Surgeon: [name], MD;  Location: [facility];  Service: Urology;  Laterality: Right;  . EXTRACORPOREAL SHOCK WAVE LITHOTRIPSY    . EXTRACORPOREAL SHOCK WAVE LITHOTRIPSY Left 07/11/2018   Procedure: EXTRACORPOREAL SHOCK WAVE LITHOTRIPSY (ESWL);  Surgeon: Cleon Gustin, MD;  Location: [facility];  Service: Urology;  Laterality: Left;  . EYE SURGERY Bilateral 09/22/13   cataract w/lens  . HOLMIUM LASER APPLICATION Left 06/03/8337   Procedure: HOLMIUM LASER APPLICATION;  Surgeon: [name], MD;  Location: [facility];  Service: Urology;  Laterality: Left;  . HOLMIUM LASER APPLICATION N/A 10/30/537   Procedure: HOLMIUM LASER APPLICATION;  Surgeon: [name], MD;  Location: [facility];  Service: Urology;  Laterality: N/A;  . HOLMIUM LASER APPLICATION N/A 7/67/3419   Procedure: HOLMIUM LASER APPLICATION;  Surgeon: [name], MD;  Location: [facility];  Service: Urology;  Laterality: N/A;  . ORIF LEFT ANKLE FX  01-15-2002  . PERCUTANEOUS NEPHROSTOMY Left  07/16/13   removed 07/21/13  . RADIOACTIVE PROSTATE SEED IMPLANTS  1997  . STONE EXTRACTION WITH BASKET Left 05/18/2016   Procedure: STONE EXTRACTION WITH BASKET;  Surgeon: [name], MD;  Location: [facility];  Service: Urology;  Laterality: Left;  . TOTAL HIP ARTHROPLASTY Right 06-29-2009  . TRANSURETHRAL RESECTION OF BLADDER NECK N/A 07/08/2013   Procedure: TRANSURETHRAL RESECTION OF BLADDER NECK;  Surgeon: [name], MD;  Location: [facility];  Service: Urology;  Laterality: N/A;  resection of baldder neck  . URETERAL STENT PLACEMENT Left 07/21/13    No Known Allergies (Not in a hospital admission)  History reviewed. No pertinent family history.  Social History   Socioeconomic History  . Marital status: Married    Spouse name: Not on file  . Number of children: Not on file  . Years of education: Not on file  . Highest education level: Not on file  Occupational History  . Not on file  Tobacco Use  . Smoking status: Never Smoker  . Smokeless tobacco: Never Used  Vaping Use  . Vaping Use: Never used  Substance and Sexual Activity  . Alcohol use: No    Comment: 2 drinks a year  . Drug use: No  . Sexual activity: Not Currently  Other Topics Concern  .  Not on file  Social History Narrative  . Not on file   Social Determinants of Health   Financial Resource Strain:   . Difficulty of Paying Living Expenses:   Food Insecurity:   . Worried About Charity fundraiser in the Last Year:   . Arboriculturist in the Last Year:   Transportation Needs:   . Film/video editor (Medical):   Marland Kitchen Lack of Transportation (Non-Medical):   Physical Activity:   . Days of Exercise per Week:   . Minutes of Exercise per Session:   Stress:   . Feeling of Stress :   Social Connections:   . Frequency of Communication with Friends and Family:   . Frequency of Social Gatherings with Friends and Family:   . Attends Religious Services:   . Active Member of  Clubs or Organizations:   . Attends Archivist Meetings:   Marland Kitchen Marital Status:   Intimate Partner Violence:   . Fear of Current or Ex-Partner:   . Emotionally Abused:   Marland Kitchen Physically Abused:   . Sexually Abused:      Review of Systems: [y] = yes, [ ]  = no       General: Weight gain [ ] ; Weight loss [ ] ; Anorexia [ ] ; Fatigue [ ] ; Fever [ ] ; Chills [x] ; Weakness [ ]     Cardiac: Chest pain/pressure [ ] ; Resting SOB [ ] ; Exertional SOB [ ] ; Orthopnea [ ] ; Pedal Edema [ ] ; Palpitations [ ] ; Syncope [ ] ; Presyncope [ ] ; Paroxysmal nocturnal dyspnea[ ]     Pulmonary: Cough [ ] ; Wheezing[ ] ; Hemoptysis[ ] ; Sputum [ ] ; Snoring [ ]     GI: Vomiting[ ] ; Dysphagia[ ] ; Melena[ ] ; Hematochezia [ ] ; Heartburn[ ] ; Abdominal pain [ ] ; Constipation [ ] ; Diarrhea [ ] ; BRBPR [ ]     GU: Hematuria[ ] ; Dysuria [ ] ; Nocturia[ ]   Vascular: Pain in legs with walking [ ] ; Pain in feet with lying flat [ ] ; Non-healing sores [ ] ; Stroke [ ] ; TIA [ ] ; Slurred speech [ ] ;    Neuro: Headaches[ ] ; Vertigo[ ] ; Seizures[ ] ; Paresthesias[ ] ;Blurred vision [ ] ; Diplopia [ ] ; Vision changes [ ]     Ortho/Skin: Arthritis [ ] ; Joint pain [ ] ; Muscle pain [ ] ; Joint swelling [ ] ; Back Pain [ ] ; Rash [ ]     Psych: Depression[ ] ; Anxiety[ ]     Heme: Bleeding problems [ ] ; Clotting disorders [ ] ; Anemia [ ]     Endocrine: Diabetes [ ] ; Thyroid dysfunction[ ]   Physical Exam: Blood pressure (!) 122/93, pulse 71, temperature (!) 101 F (38.3 C), temperature source Rectal, resp. rate (!) 23, SpO2 100 %.   Constitutional: NAD, resting comfortably Neck: normal, supple, flat JVP Respiratory: clear to auscultation bilaterally, no wheezing, no crackles. Normal respiratory effort. No accessory muscle use.  Cardiovascular: Regular rate and rhythm, no murmurs / rubs / gallops.  Abdomen: soft, NT/ND, bowel sounds positive.  Ext: No extremity edema, no clubbing / cyanosis. No joint deformity upper and lower extremities.  Normal muscle tone.  Skin: no rashes, lesions, ulcers. No induration Neurologic: No focal deficits, grossly intact Psychiatric: Normal mood and affect  Labs: Lab Results  Component Value Date   BUN 28 (H) 03/16/2020   Lab Results  Component Value Date   CREATININE 1.50 (H) 03/16/2020   Lab Results  Component Value Date   NA 140 03/16/2020   K 4.2 03/16/2020   CL 107 03/16/2020  CO2 18 (L) 03/16/2020   No results found for: TROPONINI Lab Results  Component Value Date   WBC 11.6 (H) 03/16/2020   HGB 11.2 (L) 03/16/2020   HCT 33.0 (L) 03/16/2020   MCV 100.3 (H) 03/16/2020   PLT 167 03/16/2020   No results found for: CHOL, HDL, LDLCALC, LDLDIRECT, TRIG, CHOLHDL Lab Results  Component Value Date   ALT 20 03/16/2020   AST 28 03/16/2020   ALKPHOS 66 03/16/2020   BILITOT 0.6 03/16/2020      EKG:NSR, RBBB, LAFB, 3mm ST elevation isolated to lead V2  ASSESSMENT AND PLAN:  [name] [name] is a 86 y.o. male with a documented history of HTN, known right bundle branch block, prostate CA, and hypothyroidism who presented to the ED via EMS with a chief complaint of generalized weakness and chills.  Cardiology is now consulted for evaluation of elevated troponin with an abnormal ECG.  Suspect that patient's troponin elevation is likely type II event (demand ischemia) in the setting of fever and possible infection.  ECG findings suspicious for Brugada pattern and stable now for many years.  # Troponin Elevation # Abnormal ECG -Admit to medicine for evaluation and management of fever/possible infection -Monitor on telemetry for at least 24-48 hours -Aggressively treat fever with acetaminophen -No need for heparin drip at this time -Obtain TTE to evaluate for segmental wall motion abnormalities -Trend serial ECGs and troponin   Signed: Clois Dupes 03/17/2020, 12:55 AM

## 2020-03-17 NOTE — Progress Notes (Signed)
  Echocardiogram 2D Echocardiogram has been performed.  [name] [name] 03/17/2020, 11:53 AM

## 2020-03-18 ENCOUNTER — Inpatient Hospital Stay (HOSPITAL_COMMUNITY): Payer: Medicare HMO

## 2020-03-18 ENCOUNTER — Other Ambulatory Visit: Payer: Self-pay | Admitting: Physician Assistant

## 2020-03-18 DIAGNOSIS — N183 Chronic kidney disease, stage 3 unspecified: Secondary | ICD-10-CM | POA: Diagnosis present

## 2020-03-18 DIAGNOSIS — R9431 Abnormal electrocardiogram [ECG] [EKG]: Secondary | ICD-10-CM

## 2020-03-18 DIAGNOSIS — N1330 Unspecified hydronephrosis: Secondary | ICD-10-CM | POA: Diagnosis present

## 2020-03-18 DIAGNOSIS — R079 Chest pain, unspecified: Secondary | ICD-10-CM

## 2020-03-18 DIAGNOSIS — N1832 Chronic kidney disease, stage 3b: Secondary | ICD-10-CM

## 2020-03-18 DIAGNOSIS — I5032 Chronic diastolic (congestive) heart failure: Secondary | ICD-10-CM

## 2020-03-18 DIAGNOSIS — I248 Other forms of acute ischemic heart disease: Secondary | ICD-10-CM

## 2020-03-18 LAB — MAGNESIUM: Magnesium: 1.9 mg/dL (ref 1.7–2.4)

## 2020-03-18 LAB — CBC WITH DIFFERENTIAL/PLATELET
Abs Immature Granulocytes: 0.02 10*3/uL (ref 0.00–0.07)
Basophils Absolute: 0 10*3/uL (ref 0.0–0.1)
Basophils Relative: 0 %
Eosinophils Absolute: 0.1 10*3/uL (ref 0.0–0.5)
Eosinophils Relative: 2 %
HCT: 35.1 % — ABNORMAL LOW (ref 39.0–52.0)
Hemoglobin: 10.6 g/dL — ABNORMAL LOW (ref 13.0–17.0)
Immature Granulocytes: 0 %
Lymphocytes Relative: 16 %
Lymphs Abs: 0.9 10*3/uL (ref 0.7–4.0)
MCH: 31.8 pg (ref 26.0–34.0)
MCHC: 30.2 g/dL (ref 30.0–36.0)
MCV: 105.4 fL — ABNORMAL HIGH (ref 80.0–100.0)
Monocytes Absolute: 0.5 10*3/uL (ref 0.1–1.0)
Monocytes Relative: 9 %
Neutro Abs: 4.2 10*3/uL (ref 1.7–7.7)
Neutrophils Relative %: 73 %
Platelets: 125 10*3/uL — ABNORMAL LOW (ref 150–400)
RBC: 3.33 MIL/uL — ABNORMAL LOW (ref 4.22–5.81)
RDW: 13.4 % (ref 11.5–15.5)
WBC: 5.8 10*3/uL (ref 4.0–10.5)
nRBC: 0 % (ref 0.0–0.2)

## 2020-03-18 LAB — PHOSPHORUS: Phosphorus: 3.2 mg/dL (ref 2.5–4.6)

## 2020-03-18 LAB — COMPREHENSIVE METABOLIC PANEL
ALT: 14 U/L (ref 0–44)
AST: 28 U/L (ref 15–41)
Albumin: 3.1 g/dL — ABNORMAL LOW (ref 3.5–5.0)
Alkaline Phosphatase: 63 U/L (ref 38–126)
Anion gap: 11 (ref 5–15)
BUN: 23 mg/dL (ref 8–23)
CO2: 18 mmol/L — ABNORMAL LOW (ref 22–32)
Calcium: 8.6 mg/dL — ABNORMAL LOW (ref 8.9–10.3)
Chloride: 109 mmol/L (ref 98–111)
Creatinine, Ser: 1.45 mg/dL — ABNORMAL HIGH (ref 0.61–1.24)
GFR calc Af Amer: 50 mL/min — ABNORMAL LOW (ref >60)
GFR calc non Af Amer: 43 mL/min — ABNORMAL LOW (ref >60)
Glucose, Bld: 105 mg/dL — ABNORMAL HIGH (ref 70–99)
Potassium: 4.7 mmol/L (ref 3.5–5.1)
Sodium: 138 mmol/L (ref 135–145)
Total Bilirubin: 0.9 mg/dL (ref 0.3–1.2)
Total Protein: 6 g/dL — ABNORMAL LOW (ref 6.5–8.1)

## 2020-03-18 LAB — PROCALCITONIN: Procalcitonin: 1.06 ng/mL

## 2020-03-18 MED ORDER — METOPROLOL TARTRATE 12.5 MG HALF TABLET
12.5000 mg | ORAL_TABLET | Freq: Two times a day (BID) | ORAL | Status: DC
  Administered 2020-03-18 – 2020-03-20 (×4): 12.5 mg via ORAL
  Filled 2020-03-18 (×4): qty 1

## 2020-03-18 MED ORDER — SODIUM CHLORIDE 0.9 % IV SOLN: INTRAVENOUS | Status: DC

## 2020-03-18 MED ORDER — SODIUM CHLORIDE 0.9 % IV SOLN
INTRAVENOUS | Status: DC | PRN
  Administered 2020-03-18: 250 mL via INTRAVENOUS
  Administered 2020-03-19: 1000 mL via INTRAVENOUS

## 2020-03-18 NOTE — Progress Notes (Signed)
Family at bedside. 

## 2020-03-18 NOTE — Progress Notes (Signed)
DAILY PROGRESS NOTE   Patient Name: [name] [name] Date of Encounter: 03/18/2020 Cardiologist: No primary care provider on file.  Chief Complaint   No complaints  Patient Profile   86 yo male with HTN, RBBB, prostate CA and hypothyroidism, presented with possible sepsis and elevated troponin.  Subjective   Echo yesterday showed LVEF 60-65%, grade 2 DD with normal wall motion. Troponin flat and stable, more consistent with sepsis.  Objective   Vitals:   03/17/20 2133 03/18/20 0500 03/18/20 0512 03/18/20 0800  BP:   (!) 164/80 (!) 152/75  Pulse:   74 75  Resp:   18 18  Temp:   99.1 F (37.3 C)   TempSrc:   Oral   SpO2:   99%   Weight: 101.2 kg 101.2 kg    Height: 5\' 8"  (1.727 m)       Intake/Output Summary (Last 24 hours) at 03/18/2020 1017 Last data filed at 03/18/2020 5956 Gross per 24 hour  Intake 1644.5 ml  Output 550 ml  Net 1094.5 ml   Filed Weights   03/17/20 2133 03/18/20 0500  Weight: 101.2 kg 101.2 kg    Physical Exam   General appearance: alert and no distress Neck: no carotid bruit, no JVD and thyroid not enlarged, symmetric, no tenderness/mass/nodules Lungs: clear to auscultation bilaterally Heart: regular rate and rhythm Abdomen: soft, non-tender; bowel sounds normal; no masses,  no organomegaly and obese Extremities: extremities normal, atraumatic, no cyanosis or edema Pulses: 2+ and symmetric Skin: Skin color, texture, turgor normal. No rashes or lesions Neurologic: Grossly normal Psych: Pleasant  Inpatient Medications    Scheduled Meds: . enoxaparin (LOVENOX) injection  40 mg Subcutaneous QHS  . levothyroxine  150 mcg Oral QAC breakfast  . loratadine  10 mg Oral Daily  . meloxicam  15 mg Oral Daily  . mirabegron ER  50 mg Oral Daily  . multivitamin with minerals  1 tablet Oral Once per day on Mon Thu    Continuous Infusions: . sodium chloride Stopped (03/18/20 0507)  . cefTRIAXone (ROCEPHIN)  IV 1 g (03/18/20 0507)  . sodium  chloride 100 mL/hr at 03/18/20 0507    PRN Meds: sodium chloride, acetaminophen **OR** acetaminophen, albuterol, fluticasone, ondansetron **OR** ondansetron (ZOFRAN) IV, oxyCODONE-acetaminophen, polyethylene glycol   Labs   Results for orders placed or performed during the hospital encounter of 03/16/20 (from the past 48 hour(s))  CBC with Differential/Platelet     Status: Abnormal   Collection Time: 03/16/20  6:36 PM  Result Value Ref Range   WBC 11.6 (H) 4.0 - 10.5 K/uL   RBC 3.58 (L) 4.22 - 5.81 MIL/uL   Hemoglobin 11.3 (L) 13.0 - 17.0 g/dL   HCT 35.9 (L) 39 - 52 %   MCV 100.3 (H) 80.0 - 100.0 fL   MCH 31.6 26.0 - 34.0 pg   MCHC 31.5 30.0 - 36.0 g/dL   RDW 13.2 11.5 - 15.5 %   Platelets 167 150 - 400 K/uL   nRBC 0.0 0.0 - 0.2 %   Neutrophils Relative % 90 %   Neutro Abs 10.4 (H) 1.7 - 7.7 K/uL   Lymphocytes Relative 3 %   Lymphs Abs 0.3 (L) 0.7 - 4.0 K/uL   Monocytes Relative 6 %   Monocytes Absolute 0.7 0 - 1 K/uL   Eosinophils Relative 0 %   Eosinophils Absolute 0.0 0 - 0 K/uL   Basophils Relative 0 %   Basophils Absolute 0.0 0 - 0 K/uL  Immature Granulocytes 1 %   Abs Immature Granulocytes 0.10 (H) 0.00 - 0.07 K/uL    Comment: Performed at [facility], [place] [address], [place], [place] [address]  Comprehensive metabolic panel     Status: Abnormal   Collection Time: 03/16/20  6:36 PM  Result Value Ref Range   Sodium 135 135 - 145 mmol/L   Potassium 4.3 3.5 - 5.1 mmol/L   Chloride 106 98 - 111 mmol/L   CO2 18 (L) 22 - 32 mmol/L   Glucose, Bld 174 (H) 70 - 99 mg/dL    Comment: Glucose reference range applies only to samples taken after fasting for at least 8 hours.   BUN 26 (H) 8 - 23 mg/dL   Creatinine, Ser 1.60 (H) 0.61 - 1.24 mg/dL   Calcium 9.0 8.9 - 10.3 mg/dL   Total Protein 6.7 6.5 - 8.1 g/dL   Albumin 3.8 3.5 - 5.0 g/dL   AST 28 15 - 41 U/L   ALT 20 0 - 44 U/L   Alkaline Phosphatase 66 38 - 126 U/L   Total Bilirubin 0.6 0.3 - 1.2 mg/dL   GFR calc  non Af Amer 38 (L) >60 mL/min   GFR calc Af Amer 45 (L) >60 mL/min   Anion gap 11 5 - 15    Comment: Performed at [facility] [address] [place], [place] [address]  Troponin I (High Sensitivity)     Status: Abnormal   Collection Time: 03/16/20  6:36 PM  Result Value Ref Range   Troponin I (High Sensitivity) 43 (H) <18 ng/L    Comment: (NOTE) Elevated high sensitivity troponin I (hsTnI) values and significant  changes across serial measurements may suggest ACS but many other  chronic and acute conditions are known to elevate hsTnI results.  Refer to the "Links" section for chest pain algorithms and additional  guidance. Performed at [facility], [place] [address] [place], [place] [address]   CK     Status: None   Collection Time: 03/16/20  6:36 PM  Result Value Ref Range   Total CK 94 49.0 - 397.0 U/L    Comment: Performed at [facility], [place] [address], [place], [place] [address]  SARS Coronavirus 2 by RT PCR (hospital order, performed in [facility] hospital lab) Nasopharyngeal Nasopharyngeal Swab     Status: None   Collection Time: 03/16/20  6:38 PM   Specimen: Nasopharyngeal Swab  Result Value Ref Range   SARS Coronavirus 2 NEGATIVE NEGATIVE    Comment: (NOTE) SARS-CoV-2 target nucleic acids are NOT DETECTED.  The SARS-CoV-2 RNA is generally detectable in upper and lower respiratory specimens during the acute phase of infection. The lowest concentration of SARS-CoV-2 viral copies this assay can detect is 250 copies / mL. A negative result does not preclude SARS-CoV-2 infection and should not be used as the sole basis for treatment or other patient management decisions.  A negative result may occur with improper specimen collection / handling, submission of specimen other than nasopharyngeal swab, presence of viral mutation(s) within the areas targeted by this assay, and inadequate number of viral copies (<250 copies / mL). A negative result must be  combined with clinical observations, patient history, and epidemiological information.  Fact Sheet for Patients:   StrictlyIdeas.no  Fact Sheet for Healthcare Providers: BankingDealers.co.za  This test is not yet approved or  cleared by the [place] FDA and has been authorized for detection and/or diagnosis  of SARS-CoV-2 by FDA under an Emergency Use Authorization (EUA).  This EUA will remain in effect (meaning this test can be used) for the duration of the COVID-19 declaration under Section 564(b)(1) of the Act, 21 U.S.C. section 360bbb-3(b)(1), unless the authorization is terminated or revoked sooner.  Performed at [facility], [place] [address] [place], [place] [address]   Lactic acid, plasma     Status: Abnormal   Collection Time: 03/16/20  6:55 PM  Result Value Ref Range   Lactic Acid, Venous 3.3 (HH) 0.5 - 1.9 mmol/L    Comment: CRITICAL RESULT CALLED TO, READ BACK BY AND VERIFIED WITHBarbie Banner RN [phone] K FORSYTH Performed at [facility], [place] [address], [place], [place] [address]   Blood culture (routine x 2)     Status: None (Preliminary result)   Collection Time: 03/16/20  6:55 PM   Specimen: BLOOD  Result Value Ref Range   Specimen Description BLOOD SITE NOT SPECIFIED    Special Requests      BOTTLES DRAWN AEROBIC AND ANAEROBIC Blood Culture adequate volume   Culture      NO GROWTH 2 DAYS Performed at [facility], [place] [address] [place], [place] [address]    Report Status PENDING   I-stat chem 8, ED (not at [facility] or [facility])     Status: Abnormal   Collection Time: 03/16/20  6:59 PM  Result Value Ref Range   Sodium 140 135 - 145 mmol/L   Potassium 4.2 3.5 - 5.1 mmol/L   Chloride 107 98 - 111 mmol/L   BUN 28 (H) 8 - 23 mg/dL   Creatinine, Ser 1.50 (H) 0.61 - 1.24 mg/dL   Glucose, Bld 174 (H) 70 - 99 mg/dL    Comment: Glucose reference range applies only to samples taken after fasting for at  least 8 hours.   Calcium, Ion 1.17 1.15 - 1.40 mmol/L   TCO2 21 (L) 22 - 32 mmol/L   Hemoglobin 11.2 (L) 13.0 - 17.0 g/dL   HCT 33.0 (L) 39 - 52 %  Blood culture (routine x 2)     Status: None (Preliminary result)   Collection Time: 03/16/20  7:03 PM   Specimen: BLOOD  Result Value Ref Range   Specimen Description BLOOD RIGHT ANTECUBITAL    Special Requests      BOTTLES DRAWN AEROBIC AND ANAEROBIC Blood Culture adequate volume   Culture      NO GROWTH 2 DAYS Performed at [facility] [address], [place], [place] [address]    Report Status PENDING   Lactic acid, plasma     Status: Abnormal   Collection Time: 03/16/20  8:49 PM  Result Value Ref Range   Lactic Acid, Venous 2.3 (HH) 0.5 - 1.9 mmol/L    Comment: CRITICAL VALUE NOTED.  VALUE IS CONSISTENT WITH PREVIOUSLY REPORTED AND CALLED VALUE. Performed at [facility], [place] [address] [place], [place] [address]   Troponin I (High Sensitivity)     Status: Abnormal   Collection Time: 03/16/20  8:49 PM  Result Value Ref Range   Troponin I (High Sensitivity) 124 (HH) <18 ng/L    Comment: CRITICAL RESULT CALLED TO, READ BACK BY AND VERIFIED WITH: BECK B,RN 03/16/20 2151 WAYK Performed at [facility], [place] [address] [place],  [address]   Urinalysis, Routine w reflex microscopic     Status: Abnormal   Collection Time: 03/16/20 10:49 PM  Result Value Ref Range   Color, Urine YELLOW YELLOW   APPearance HAZY (A) CLEAR   Specific Gravity, Urine 1.014 1.005 - 1.030   pH 7.0 5.0 - 8.0   Glucose, UA NEGATIVE NEGATIVE mg/dL   Hgb urine dipstick SMALL (A) NEGATIVE   Bilirubin Urine NEGATIVE NEGATIVE   Ketones, ur NEGATIVE NEGATIVE mg/dL   Protein, ur 30 (A) NEGATIVE mg/dL   Nitrite POSITIVE (A) NEGATIVE   Leukocytes,Ua LARGE (A) NEGATIVE   RBC / HPF 0-5 0 - 5 RBC/hpf   WBC, UA 21-50 0 - 5 WBC/hpf   Bacteria, UA FEW (A) NONE SEEN    Comment: Performed at [facility] [address]  [place], [place] [address]  Culture, Urine     Status: None (Preliminary result)   Collection Time: 03/16/20 10:52 PM   Specimen: Urine, Random  Result Value Ref Range   Specimen Description URINE, RANDOM    Special Requests NONE    Culture      CULTURE REINCUBATED FOR BETTER GROWTH Performed at [facility], [place] [address], [place], [place] [address]    Report Status PENDING   Troponin I (High Sensitivity)     Status: Abnormal   Collection Time: 03/17/20  1:27 AM  Result Value Ref Range   Troponin I (High Sensitivity) 152 (HH) <18 ng/L    Comment: CRITICAL VALUE NOTED.  VALUE IS CONSISTENT WITH PREVIOUSLY REPORTED AND CALLED VALUE. (NOTE) Elevated high sensitivity troponin I (hsTnI) values and significant  changes across serial measurements may suggest ACS but many other  chronic and acute conditions are known to elevate hsTnI results.  Refer to the Links section for chest pain algorithms and additional  guidance. Performed at [facility], [place] [address], [place], [place] [address]   CBC     Status: Abnormal   Collection Time: 03/17/20  2:53 AM  Result Value Ref Range   WBC 8.4 4.0 - 10.5 K/uL   RBC 3.08 (L) 4.22 - 5.81 MIL/uL   Hemoglobin 9.9 (L) 13.0 - 17.0 g/dL   HCT 31.1 (L) 39 - 52 %   MCV 101.0 (H) 80.0 - 100.0 fL   MCH 32.1 26.0 - 34.0 pg   MCHC 31.8 30.0 - 36.0 g/dL   RDW 13.5 11.5 - 15.5 %   Platelets 144 (L) 150 - 400 K/uL   nRBC 0.0 0.0 - 0.2 %    Comment: Performed at [facility], [place] [address] [place],  [address]  Basic metabolic panel     Status: Abnormal   Collection Time: 03/17/20  2:53 AM  Result Value Ref Range   Sodium 140 135 - 145 mmol/L   Potassium 4.7 3.5 - 5.1 mmol/L   Chloride 111 98 - 111 mmol/L   CO2 22 22 - 32 mmol/L   Glucose, Bld 120 (H) 70 - 99 mg/dL    Comment: Glucose reference range applies only to samples taken after fasting for at least 8 hours.   BUN 25 (H) 8 - 23 mg/dL   Creatinine, Ser 1.32 (H) 0.61 -  1.24 mg/dL   Calcium 8.6 (L) 8.9 - 10.3 mg/dL   GFR calc non Af Amer 49 (L) >60 mL/min   GFR calc Af Amer 56 (L) >60 mL/min   Anion gap 7 5 - 15    Comment: Performed at [facility] [address] [place], [place] [address]  Troponin I (High Sensitivity)  Status: Abnormal   Collection Time: 03/17/20  2:53 AM  Result Value Ref Range   Troponin I (High Sensitivity) 143 (HH) <18 ng/L    Comment: CRITICAL VALUE NOTED.  VALUE IS CONSISTENT WITH PREVIOUSLY REPORTED AND CALLED VALUE. (NOTE) Elevated high sensitivity troponin I (hsTnI) values and significant  changes across serial measurements may suggest ACS but many other  chronic and acute conditions are known to elevate hsTnI results.  Refer to the Links section for chest pain algorithms and additional  guidance. Performed at [facility], [place] [address] [place], [place] [address]   Procalcitonin - Baseline     Status: None   Collection Time: 03/17/20  8:35 AM  Result Value Ref Range   Procalcitonin 1.71 ng/mL    Comment:        Interpretation: PCT > 0.5 ng/mL and <= 2 ng/mL: Systemic infection (sepsis) is possible, but other conditions are known to elevate PCT as well. (NOTE)       Sepsis PCT Algorithm           Lower Respiratory Tract                                      Infection PCT Algorithm    ----------------------------     ----------------------------         PCT < 0.25 ng/mL                PCT < 0.10 ng/mL          Strongly encourage             Strongly discourage   discontinuation of antibiotics    initiation of antibiotics    ----------------------------     -----------------------------       PCT 0.25 - 0.50 ng/mL            PCT 0.10 - 0.25 ng/mL               OR       >80% decrease in PCT            Discourage initiation of                                            antibiotics      Encourage discontinuation           of antibiotics    ----------------------------      -----------------------------         PCT >= 0.50 ng/mL              PCT 0.26 - 0.50 ng/mL                AND       <80% decrease in PCT             Encourage initiation of                                             antibiotics       Encourage continuation           of antibiotics    ----------------------------     -----------------------------  PCT >= 0.50 ng/mL                  PCT > 0.50 ng/mL               AND         increase in PCT                  Strongly encourage                                      initiation of antibiotics    Strongly encourage escalation           of antibiotics                                     -----------------------------                                           PCT <= 0.25 ng/mL                                                 OR                                        > 80% decrease in PCT                                      Discontinue / Do not initiate                                             antibiotics  Performed at [facility], [address], [place], [place] [address]   Lactic acid, plasma     Status: None   Collection Time: 03/17/20  8:35 AM  Result Value Ref Range   Lactic Acid, Venous 1.1 0.5 - 1.9 mmol/L    Comment: Performed at [facility] [address] [place], [place] [address]  Lactic acid, plasma     Status: None   Collection Time: 03/17/20 10:59 AM  Result Value Ref Range   Lactic Acid, Venous 1.9 0.5 - 1.9 mmol/L    Comment: Performed at [facility] [address] [place], [place] [address]  Comprehensive metabolic panel     Status: Abnormal   Collection Time: 03/18/20  4:28 AM  Result Value Ref Range   Sodium 138 135 - 145 mmol/L   Potassium 4.7 3.5 - 5.1 mmol/L   Chloride 109 98 - 111 mmol/L   CO2 18 (L) 22 - 32 mmol/L   Glucose, Bld 105 (H) 70 - 99 mg/dL    Comment: Glucose reference range applies only to samples taken after fasting for at least 8 hours.   BUN 23 8 - 23 mg/dL  Creatinine, Ser 1.45 (H) 0.61 - 1.24 mg/dL   Calcium 8.6 (L) 8.9 - 10.3 mg/dL   Total Protein 6.0 (L) 6.5 - 8.1 g/dL   Albumin 3.1 (L) 3.5 - 5.0 g/dL   AST 28 15 - 41 U/L   ALT 14 0 - 44 U/L   Alkaline Phosphatase 63 38 - 126 U/L   Total Bilirubin 0.9 0.3 - 1.2 mg/dL   GFR calc non Af Amer 43 (L) >60 mL/min   GFR calc Af Amer 50 (L) >60 mL/min   Anion gap 11 5 - 15    Comment: Performed at [facility] [address] [place], [place] [address]  Magnesium     Status: None   Collection Time: 03/18/20  4:28 AM  Result Value Ref Range   Magnesium 1.9 1.7 - 2.4 mg/dL    Comment: Performed at [facility] [address], [place], [place] [address]  Phosphorus     Status: None   Collection Time: 03/18/20  4:28 AM  Result Value Ref Range   Phosphorus 3.2 2.5 - 4.6 mg/dL    Comment: Performed at [facility] [address] [place], [place] [address]  CBC with Differential/Platelet     Status: Abnormal   Collection Time: 03/18/20  4:28 AM  Result Value Ref Range   WBC 5.8 4.0 - 10.5 K/uL   RBC 3.33 (L) 4.22 - 5.81 MIL/uL   Hemoglobin 10.6 (L) 13.0 - 17.0 g/dL   HCT 35.1 (L) 39 - 52 %   MCV 105.4 (H) 80.0 - 100.0 fL   MCH 31.8 26.0 - 34.0 pg   MCHC 30.2 30.0 - 36.0 g/dL   RDW 13.4 11.5 - 15.5 %   Platelets 125 (L) 150 - 400 K/uL    Comment: Immature Platelet Fraction may be clinically indicated, consider ordering this additional test [id number] REPEATED TO VERIFY    nRBC 0.0 0.0 - 0.2 %   Neutrophils Relative % 73 %   Neutro Abs 4.2 1.7 - 7.7 K/uL   Lymphocytes Relative 16 %   Lymphs Abs 0.9 0.7 - 4.0 K/uL   Monocytes Relative 9 %   Monocytes Absolute 0.5 0 - 1 K/uL   Eosinophils Relative 2 %   Eosinophils Absolute 0.1 0 - 0 K/uL   Basophils Relative 0 %   Basophils Absolute 0.0 0 - 0 K/uL   Immature Granulocytes 0 %   Abs Immature Granulocytes 0.02 0.00 - 0.07 K/uL    Comment: Performed at [facility], [place] [address] [place], [place] [address]   Procalcitonin     Status: None   Collection Time: 03/18/20  4:28 AM  Result Value Ref Range   Procalcitonin 1.06 ng/mL    Comment:        Interpretation: PCT > 0.5 ng/mL and <= 2 ng/mL: Systemic infection (sepsis) is possible, but other conditions are known to elevate PCT as well. (NOTE)       Sepsis PCT Algorithm           Lower Respiratory Tract                                      Infection PCT Algorithm    ----------------------------     ----------------------------         PCT < 0.25 ng/mL  PCT < 0.10 ng/mL          Strongly encourage             Strongly discourage   discontinuation of antibiotics    initiation of antibiotics    ----------------------------     -----------------------------       PCT 0.25 - 0.50 ng/mL            PCT 0.10 - 0.25 ng/mL               OR       >80% decrease in PCT            Discourage initiation of                                            antibiotics      Encourage discontinuation           of antibiotics    ----------------------------     -----------------------------         PCT >= 0.50 ng/mL              PCT 0.26 - 0.50 ng/mL                AND       <80% decrease in PCT             Encourage initiation of                                             antibiotics       Encourage continuation           of antibiotics    ----------------------------     -----------------------------        PCT >= 0.50 ng/mL                  PCT > 0.50 ng/mL               AND         increase in PCT                  Strongly encourage                                      initiation of antibiotics    Strongly encourage escalation           of antibiotics                                     -----------------------------                                           PCT <= 0.25 ng/mL                                                 OR                                        >  80% decrease in PCT                                      Discontinue  / Do not initiate                                             antibiotics  Performed at [facility], [place] [address], [place], [place] [address]     ECG   N/A - Personally Reviewed  Telemetry   Sinus rhythm - Personally Reviewed  Radiology    CT Head Wo Contrast  Result Date: 03/16/2020 CLINICAL DATA:  Status post fall. EXAM: CT HEAD WITHOUT CONTRAST TECHNIQUE: Contiguous axial images were obtained from the base of the skull through the vertex without intravenous contrast. COMPARISON:  None. FINDINGS: Brain: There is mild cerebral atrophy with widening of the extra-axial spaces and ventricular dilatation. There are areas of decreased attenuation within the white matter tracts of the supratentorial brain, consistent with microvascular disease changes. Vascular: No hyperdense vessel or unexpected calcification. Skull: Normal. Negative for fracture or focal lesion. Sinuses/Orbits: No acute finding. Other: None. IMPRESSION: 1. Generalized cerebral atrophy. 2. No acute intracranial abnormality. Electronically Signed   By: Virgina Norfolk M.D.   On: 03/16/2020 19:34   CT Cervical Spine Wo Contrast  Result Date: 03/16/2020 CLINICAL DATA:  Status post fall. EXAM: CT CERVICAL SPINE WITHOUT CONTRAST TECHNIQUE: Multidetector CT imaging of the cervical spine was performed without intravenous contrast. Multiplanar CT image reconstructions were also generated. COMPARISON:  None. FINDINGS: Alignment: Normal. Skull base and vertebrae: No acute fracture. No primary bone lesion or focal pathologic process. Soft tissues and spinal canal: No prevertebral fluid or swelling. No visible canal hematoma. Disc levels: Moderate to marked severity endplate sclerosis is seen at the levels of C3-C4, C4-C5, C5-C6 and C6-C7. Marked severity intervertebral disc space narrowing is also seen at these levels. There is moderate severity bilateral multilevel facet joint hypertrophy. Upper chest: Negative. Other: None.  IMPRESSION: 1. No acute osseous abnormality. 2. Marked severity multilevel degenerative disc disease and facet joint hypertrophy. Electronically Signed   By: Virgina Norfolk M.D.   On: 03/16/2020 19:36   US RENAL  Result Date: 03/17/2020 CLINICAL DATA:  UTI/sepsis EXAM: RENAL / URINARY TRACT ULTRASOUND COMPLETE COMPARISON:  CT 09/23/2019, ultrasound 12/04/2018 FINDINGS: Right Kidney: Renal measurements: 10.5 x 5.9 x 4.9 cm = volume: 159 mL. Diffusely increased renal cortical echogenicity. Moderate right hydronephrosis with central and peripheral calyceal dilatation. No visible shadowing calculus or worrisome renal lesion. Previously seen cysts not well visualized sonographically. Left Kidney: Renal measurements: 10 x 5.7 x 4.2 cm = volume: 124.5 mL. Diffusely increased renal cortical echogenicity. No shadowing calculus, hydronephrosis or concerning renal mass. Previously seen cysts not well visualized sonographically. Bladder: Appears normal for degree of bladder distention. Other: Technically difficult exam due to patient body habitus and inability to reposition patient. IMPRESSION: Technically challenging exam due to inability to reposition patient and patient's body habitus. Increased renal cortical echogenicity compatible with medical renal disease. Moderate right hydronephrosis without visible shadowing calculus. Distal calculus is not excluded. Electronically Signed   By: Lovena Le M.D.   On: 03/17/2020 00:32   DG Chest Port 1 View  Result Date: 03/16/2020 CLINICAL DATA:  Status post fall with subsequent  chest pain. EXAM: PORTABLE CHEST 1 VIEW COMPARISON:  March 20, 2019 FINDINGS: There is no evidence of acute infiltrate, pleural effusion or pneumothorax. The heart size and mediastinal contours are within normal limits. Marked severity degenerative changes seen involving both shoulders with multilevel degenerative changes noted throughout the thoracic spine. A small cortical irregularity is seen  along the lateral aspect of the sixth left rib. Additional chronic sixth and seventh left rib fractures are noted. IMPRESSION: 1. No acute cardiopulmonary disease. 2. Chronic left-sided rib fractures with additional findings which may represent a nondisplaced acute fracture of the sixth left rib. Electronically Signed   By: Virgina Norfolk M.D.   On: 03/16/2020 19:38   ECHOCARDIOGRAM COMPLETE  Result Date: 03/17/2020    ECHOCARDIOGRAM REPORT   Patient Name:   LATONYA KNIGHT Date of Exam: 03/17/2020 Medical Rec #:  [id number]        Height:       68.0 in Accession #:    [id number]       Weight:       224.7 lb Date of Birth:  Jan 25, 1932        BSA:          2.147 m Patient Age:    31 years         BP:           173/97 mmHg Patient Gender: M                HR:           66 bpm. Exam Location:  Inpatient Procedure: 2D Echo, Cardiac Doppler and Color Doppler Indications:    Elevated Troponin  History:        Patient has no prior history of Echocardiogram examinations.                 Arrythmias:RBBB; Signs/Symptoms:Murmur. Cancer.  Sonographer:    Jonelle Sidle Dance Referring Phys: [id number] [place]  1. Left ventricular ejection fraction, by estimation, is 60 to 65%. The left ventricle has normal function. The left ventricle has no regional wall motion abnormalities. Left ventricular diastolic parameters are consistent with Grade II diastolic dysfunction (pseudonormalization).  2. Right ventricular systolic function is normal. The right ventricular size is normal. There is normal pulmonary artery systolic pressure.  3. The mitral valve is grossly normal. No evidence of mitral valve regurgitation. No evidence of mitral stenosis.  4. The aortic valve is grossly normal. Aortic valve regurgitation is not visualized. No aortic stenosis is present. FINDINGS  Left Ventricle: Left ventricular ejection fraction, by estimation, is 60 to 65%. The left ventricle has normal function. The left ventricle has no  regional wall motion abnormalities. The left ventricular internal cavity size was normal in size. There is  no left ventricular hypertrophy. Left ventricular diastolic parameters are consistent with Grade II diastolic dysfunction (pseudonormalization). Right Ventricle: The right ventricular size is normal. No increase in right ventricular wall thickness. Right ventricular systolic function is normal. There is normal pulmonary artery systolic pressure. The tricuspid regurgitant velocity is 2.26 m/s, and  with an assumed right atrial pressure of 8 mmHg, the estimated right ventricular systolic pressure is 19.5 mmHg. Left Atrium: Left atrial size was normal in size. Right Atrium: Right atrial size was normal in size. Pericardium: There is no evidence of pericardial effusion. Mitral Valve: The mitral valve is grossly normal. No evidence of mitral valve regurgitation. No evidence of mitral valve stenosis. Tricuspid Valve: The tricuspid valve is grossly  normal. Tricuspid valve regurgitation is trivial. No evidence of tricuspid stenosis. Aortic Valve: The aortic valve is grossly normal. Aortic valve regurgitation is not visualized. Aortic regurgitation PHT measures 744 msec. No aortic stenosis is present. There is mild calcification of the aortic valve. Aortic valve mean gradient measures 7.0 mmHg. Aortic valve peak gradient measures 14.4 mmHg. Aortic valve area, by VTI measures 2.16 cm. Pulmonic Valve: The pulmonic valve was grossly normal. Pulmonic valve regurgitation is not visualized. No evidence of pulmonic stenosis. Aorta: The aortic root and ascending aorta are structurally normal, with no evidence of dilitation. IAS/Shunts: The atrial septum is grossly normal.  LEFT VENTRICLE PLAX 2D LVIDd:         3.80 cm  Diastology LVIDs:         3.20 cm  LV e' lateral:   7.07 cm/s LV PW:         1.20 cm  LV E/e' lateral: 17.1 LV IVS:        1.10 cm  LV e' medial:    6.42 cm/s LVOT diam:     2.00 cm  LV E/e' medial:  18.8 LV  SV:         80 LV SV Index:   37 LVOT Area:     3.14 cm  RIGHT VENTRICLE             IVC RV Basal diam:  2.90 cm     IVC diam: 2.10 cm RV S prime:     12.30 cm/s TAPSE (M-mode): 1.7 cm LEFT ATRIUM             Index       RIGHT ATRIUM           Index LA diam:        4.40 cm 2.05 cm/m  RA Area:     12.80 cm LA Vol (A2C):   77.3 ml 36.00 ml/m RA Volume:   25.30 ml  11.78 ml/m LA Vol (A4C):   38.1 ml 17.74 ml/m LA Biplane Vol: 56.4 ml 26.26 ml/m  AORTIC VALVE AV Area (Vmax):    1.97 cm AV Area (Vmean):   1.86 cm AV Area (VTI):     2.16 cm AV Vmax:           189.50 cm/s AV Vmean:          122.000 cm/s AV VTI:            0.372 m AV Peak Grad:      14.4 mmHg AV Mean Grad:      7.0 mmHg LVOT Vmax:         119.00 cm/s LVOT Vmean:        72.400 cm/s LVOT VTI:          0.256 m LVOT/AV VTI ratio: 0.69 AI PHT:            744 msec  AORTA Ao Root diam: 3.80 cm Ao Asc diam:  3.40 cm MITRAL VALVE                TRICUSPID VALVE MV Area (PHT): 2.60 cm     TR Peak grad:   20.4 mmHg MV Decel Time: 292 msec     TR Vmax:        226.00 cm/s MV E velocity: 121.00 cm/s MV A velocity: 116.00 cm/s  SHUNTS MV E/A ratio:  1.04         Systemic VTI:  0.26 m  Systemic Diam: 2.00 cm Mertie Moores MD Electronically signed by Mertie Moores MD Signature Date/Time: 03/17/2020/3:38:44 PM    Final     Cardiac Studies   N/A  Assessment   1. Principal Problem: 2.   Sepsis secondary to UTI ([place]) 3. Active Problems: 4.   Prostate cancer ([place]) 5.   Demand ischemia ([place]) 6.  Abnormal EKG - possible Brugada pattern  Plan   1. Flat troponin elevation, likely demand ischemia. No chest pain. Suspect related to sepsis. Echo is reassuring, with normal LVEF but grade 2 DD. Abnormal EKG, possible Brugada pattern. Would recommend outpatient follow-up with me and lexiscan myoview stress testing in the office, which I will arrange. No further suggestions at this time. Call with questions.  [facility] will sign  off.   Medication Recommendations:  none Other recommendations (labs, testing, etc):  Outpatient myoview stress testing  Follow up as an outpatient:  Dr. Debara Pickett after testing   Time Spent Directly with Patient:  I have spent a total of 25 minutes with the patient reviewing hospital notes, telemetry, EKGs, labs and examining the patient as well as establishing an assessment and plan that was discussed personally with the patient.  > 50% of time was spent in direct patient care.  Length of Stay:  LOS: 2 days   Pixie Casino, MD, [facility], [place] Director of the [facility]  Card[facility] Diplomate of the American Board of Clinical Lipidology Attending Cardiologist  Direct Dial: [phone]  Fax: [phone]  Website:  [email or web address]  Nadean Corwin Ileah Falkenstein 03/18/2020, 10:17 AM

## 2020-03-18 NOTE — Progress Notes (Signed)
PROGRESS NOTE    [name] [name]  OFH:[id number] DOB: [birth year] DOA: 03/16/2020 PCP: [name] Dus, MD     Brief Narrative:  86 y.o. WM PMHx HTN, RBBB, prostate CA. nephrolithiasis, Hx urinary tract obstruction, hypothyroidism, CKD stage IIIb  Pt presents to ED with c/o generalized weakness.  Patient states that he was transferring from his chair and felt weak and fell face forward. He states that he is wife called immediately. EMS noticed that he was on the floor and was having some chest pain. His EKG in the field showed STEMI in V1 and V2. Patient states that he has not been on it for that long. He states that he was given aspirin 324 mg by EMS. He has no chest pain currently. Patient is not on any blood thinners.   ED Course: Cards saw pt: not a STEMI.  Found to have Tm 101.  Found to have UTI.  WBC 11.6.  Lactate 3.3.  Creat 1.6, slightly up from baseline of 1.35 earlier this year.  Trops were 43 and 124.  Cards feels that this is demand ischemia, pt remains CP free.    Subjective: 6/24 afebrile last 24 hours, A/O x4, negative CP, negative S OB, negative N/V.  Positive chronic back pain and rib pain.    Assessment & Plan: Covid vaccination; positive vaccination     Principal Problem:   Sepsis secondary to UTI [facility]) Active Problems:   Prostate cancer ([place])   Demand ischemia ([place])   CKD (chronic kidney disease), stage IIIb   Hydronephrosis of right kidney   Sepsis UTI/severe sepsis/ positive Staph Simulans -On admission met criteria for sepsis temp> 38 C, HR> 90 bpm, RR> 20; lactic acid> 2 -Elevated procalcitonin 1.71, will complete 5-day course antibiotics. -6/24 urine cultures positive for Staph Simulans -Blood culture still NGTD -6/24 decrease Normal Saline 50 ml/hr -Continue current antibiotic until susceptibilities result.  Spoke with Dr. Baxter Flattery ID and she states if pansensitive can change over to amoxicillin or Keflex to complete  treatment.  Increasing PSA/RIGHT Hydronephrosis -Received a call from Dr. [name] Urology, who has treated patient in the past for his prostate cancer and urinary tract obstruction.  Concerned that with an increasing PSA and lymphadenopathy (unable to see in epic), that patient may require cystoscopy and stent or percutaneous drain.   -Have ordered CT abdomen pelvis W0 contrast stone protocol as requested. -Dr. Jeffie Pollock will see patient in a.m. await recommendations  Chronic diastolic CHF -Strict in and out +2.3 L -Daily weight -6/24 Metoprolol 12.5 mg BID  Essential HTN -See CHF  Demand ischemia -See CHF -Trend troponins Results for [name], [name]" (MRN [id number]) as of 03/18/2020 18:44  Ref. Range 03/16/2020 18:36 03/16/2020 20:49 03/17/2020 01:27 03/17/2020 02:53  Troponin I (High Sensitivity) Latest Ref Range: <18 ng/L 43 (H) 124 (HH) 152 (HH) 143 (HH)    Prostate cancer -Currently not on any treatment.  CKD stage IIIb (baseline Cr 1.35) Recent Labs  Lab 03/16/20 1836 03/16/20 1859 03/17/20 0253 03/18/20 0428  CREATININE 1.60* 1.50* 1.32* 1.45*  -Slightly above baseline -Avoid all nephrotoxic medication    Goals of care -6/24 PT/OT consult; demand ischemia/chronic diastolic CHF evaluate patient for CIR vs SNF    DVT prophylaxis: Lovenox Code Status: Full Family Communication: 6/24 wife and son at bedside for discussion of plan of care Status is: Inpatient    Dispo: The patient is from: Home  Anticipated d/c is to: Home              Anticipated d/c date is: 6/28              Patient currently unstable      Consultants:  Cardiology Phone consult Dr. Carlyle Basques ID Dr. [name] Urology   Procedures/Significant Events:  6/22 Renal Ultrasound;-Increased renal cortical echogenicity compatible with medical renal disease. -Moderate right hydronephrosis without visible shadowing calculus. Distal calculus is not excluded. 6/23  echocardiogram; Left Ventricle: LVEF= 60 to 65%.  -Grade II diastolic dysfunction (pseudonormalization).      I have personally reviewed and interpreted all radiology studies and my findings are as above.  VENTILATOR SETTINGS:    Cultures 6/22 SARS coronavirus negative 6/22 urine positive Staph Simulans 6/22 blood NGTD 6/22 blood RIGHT AC NGTD    Antimicrobials: Anti-infectives (From admission, onward)   Start     Dose/Rate Stop   03/17/20 0600  cefTRIAXone (ROCEPHIN) 1 g in sodium chloride 0.9 % 100 mL IVPB     Discontinue     1 g 200 mL/hr over 30 Minutes     03/16/20 2300  ceFEPIme (MAXIPIME) 1 g in sodium chloride 0.9 % 100 mL IVPB        1 g 200 mL/hr over 30 Minutes 03/17/20 0007   03/16/20 2230  vancomycin (VANCOCIN) IVPB 1000 mg/200 mL premix  Status:  Discontinued        1,000 mg 200 mL/hr over 60 Minutes 03/16/20 2349       Devices    LINES / TUBES:      Continuous Infusions:  sodium chloride Stopped (03/18/20 0507)   sodium chloride 50 mL/hr at 03/18/20 1702   cefTRIAXone (ROCEPHIN)  IV 1 g (03/18/20 0507)     Objective: Vitals:   03/18/20 0500 03/18/20 0512 03/18/20 0800 03/18/20 1700  BP:  (!) 164/80 (!) 152/75 (!) 145/69  Pulse:  74 75 76  Resp:  _0 Temp:  99.1 F (37.3 C)  98.2 F (36.8 C)  TempSrc:  Oral  Oral  SpO2:  99%  100%  Weight: 101.2 kg     Height:        Intake/Output Summary (Last 24 hours) at 03/18/2020 1941 Last data filed at 03/18/2020 1823 Gross per 24 hour  Intake 2064.5 ml  Output 1550 ml  Net 514.5 ml   Filed Weights   03/17/20 2133 03/18/20 0500  Weight: 101.2 kg 101.2 kg    Physical Exam:  General: A/O x4, no acute respiratory distress Eyes: negative scleral hemorrhage, negative anisocoria, negative icterus ENT: Negative Runny nose, negative gingival bleeding, Neck:  Negative scars, masses, torticollis, lymphadenopathy, JVD Lungs: Clear to auscultation bilaterally without wheezes or  crackles Cardiovascular: Regular rate and rhythm without murmur gallop or rub normal S1 and S2 Abdomen: MORBIDLY OBESE, negative abdominal pain, nondistended, positive soft, bowel sounds, no rebound, no ascites, no appreciable mass Extremities: No significant cyanosis, clubbing, or edema bilateral lower extremities Skin: Negative rashes, lesions, ulcers Psychiatric:  Negative depression, negative anxiety, negative fatigue, negative mania  Central nervous system:  Cranial nerves II through XII intact, tongue/uvula midline, all extremities muscle strength 5/5, sensation intact throughout, negative dysarthria, negative expressive aphasia, negative receptive aphasia.  .     Data Reviewed: Care during the described time interval was provided by me .  I have reviewed this patient's available data, including medical history, events of note, physical examination, and all test results  as part of my evaluation.  CBC: Recent Labs  Lab 03/16/20 1836 03/16/20 1859 03/17/20 0253 03/18/20 0428  WBC 11.6*  --  8.4 5.8  NEUTROABS 10.4*  --   --  4.2  HGB 11.3* 11.2* 9.9* 10.6*  HCT 35.9* 33.0* 31.1* 35.1*  MCV 100.3*  --  101.0* 105.4*  PLT 167  --  144* 323*   Basic Metabolic Panel: Recent Labs  Lab 03/16/20 1836 03/16/20 1859 03/17/20 0253 03/18/20 0428  NA 135 140 140 138  K 4.3 4.2 4.7 4.7  CL 106 107 111 109  CO2 18*  --  22 18*  GLUCOSE 174* 174* 120* 105*  BUN 26* 28* 25* 23  CREATININE 1.60* 1.50* 1.32* 1.45*  CALCIUM 9.0  --  8.6* 8.6*  MG  --   --   --  1.9  PHOS  --   --   --  3.2   GFR: Estimated Creatinine Clearance: 42.2 mL/min (A) (by C-G formula based on SCr of 1.45 mg/dL (H)). Liver Function Tests: Recent Labs  Lab 03/16/20 1836 03/18/20 0428  AST 28 28  ALT 20 14  ALKPHOS 66 63  BILITOT 0.6 0.9  PROT 6.7 6.0*  ALBUMIN 3.8 3.1*   No results for input(s): LIPASE, AMYLASE in the last 168 hours. No results for input(s): AMMONIA in the last 168  hours. Coagulation Profile: No results for input(s): INR, PROTIME in the last 168 hours. Cardiac Enzymes: Recent Labs  Lab 03/16/20 1836  CKTOTAL 94   BNP (last 3 results) No results for input(s): PROBNP in the last 8760 hours. HbA1C: No results for input(s): HGBA1C in the last 72 hours. CBG: No results for input(s): GLUCAP in the last 168 hours. Lipid Profile: No results for input(s): CHOL, HDL, LDLCALC, TRIG, CHOLHDL, LDLDIRECT in the last 72 hours. Thyroid Function Tests: No results for input(s): TSH, T4TOTAL, FREET4, T3FREE, THYROIDAB in the last 72 hours. Anemia Panel: No results for input(s): VITAMINB12, FOLATE, FERRITIN, TIBC, IRON, RETICCTPCT in the last 72 hours. Sepsis Labs: Recent Labs  Lab 03/16/20 1855 03/16/20 2049 03/17/20 0835 03/17/20 1059 03/18/20 0428  PROCALCITON  --   --  1.71  --  1.06  LATICACIDVEN 3.3* 2.3* 1.1 1.9  --     Recent Results (from the past 240 hour(s))  SARS Coronavirus 2 by RT PCR (hospital order, performed in [facility] hospital lab) Nasopharyngeal Nasopharyngeal Swab     Status: None   Collection Time: 03/16/20  6:38 PM   Specimen: Nasopharyngeal Swab  Result Value Ref Range Status   SARS Coronavirus 2 NEGATIVE NEGATIVE Final    Comment: (NOTE) SARS-CoV-2 target nucleic acids are NOT DETECTED.  The SARS-CoV-2 RNA is generally detectable in upper and lower respiratory specimens during the acute phase of infection. The lowest concentration of SARS-CoV-2 viral copies this assay can detect is 250 copies / mL. A negative result does not preclude SARS-CoV-2 infection and should not be used as the sole basis for treatment or other patient management decisions.  A negative result may occur with improper specimen collection / handling, submission of specimen other than nasopharyngeal swab, presence of viral mutation(s) within the areas targeted by this assay, and inadequate number of viral copies (<250 copies / mL). A negative result  must be combined with clinical observations, patient history, and epidemiological information.  Fact Sheet for Patients:   StrictlyIdeas.no  Fact Sheet for Healthcare Providers: BankingDealers.co.za  This test is not yet approved or  cleared by  the [employer] and has been authorized for detection and/or diagnosis of SARS-CoV-2 by FDA under an Emergency Use Authorization (EUA).  This EUA will remain in effect (meaning this test can be used) for the duration of the COVID-19 declaration under Section 564(b)(1) of the Act, 21 U.S.C. section 360bbb-3(b)(1), unless the authorization is terminated or revoked sooner.  Performed at [facility], [place] [address], [place], [place] [address]   Blood culture (routine x 2)     Status: None (Preliminary result)   Collection Time: 03/16/20  6:55 PM   Specimen: BLOOD  Result Value Ref Range Status   Specimen Description BLOOD SITE NOT SPECIFIED  Final   Special Requests   Final    BOTTLES DRAWN AEROBIC AND ANAEROBIC Blood Culture adequate volume   Culture   Final    NO GROWTH 2 DAYS Performed at [facility], [address] [place], [place] [address]    Report Status PENDING  Incomplete  Blood culture (routine x 2)     Status: None (Preliminary result)   Collection Time: 03/16/20  7:03 PM   Specimen: BLOOD  Result Value Ref Range Status   Specimen Description BLOOD RIGHT ANTECUBITAL  Final   Special Requests   Final    BOTTLES DRAWN AEROBIC AND ANAEROBIC Blood Culture adequate volume   Culture   Final    NO GROWTH 2 DAYS Performed at [facility], [place] [address], [place], [place] [address]    Report Status PENDING  Incomplete  Culture, Urine     Status: Abnormal (Preliminary result)   Collection Time: 03/16/20 10:52 PM   Specimen: Urine, Random  Result Value Ref Range Status   Specimen Description URINE, RANDOM  Final   Special Requests NONE  Final   Culture (A)   Final    >=100,000 COLONIES/mL STAPHYLOCOCCUS SIMULANS SUSCEPTIBILITIES TO FOLLOW Performed at [facility], [place] [address], [place],  [address]    Report Status PENDING  Incomplete         Radiology Studies: US RENAL  Result Date: 03/17/2020 CLINICAL DATA:  UTI/sepsis EXAM: RENAL / URINARY TRACT ULTRASOUND COMPLETE COMPARISON:  CT 09/23/2019, ultrasound 12/04/2018 FINDINGS: Right Kidney: Renal measurements: 10.5 x 5.9 x 4.9 cm = volume: 159 mL. Diffusely increased renal cortical echogenicity. Moderate right hydronephrosis with central and peripheral calyceal dilatation. No visible shadowing calculus or worrisome renal lesion. Previously seen cysts not well visualized sonographically. Left Kidney: Renal measurements: 10 x 5.7 x 4.2 cm = volume: 124.5 mL. Diffusely increased renal cortical echogenicity. No shadowing calculus, hydronephrosis or concerning renal mass. Previously seen cysts not well visualized sonographically. Bladder: Appears normal for degree of bladder distention. Other: Technically difficult exam due to patient body habitus and inability to reposition patient. IMPRESSION: Technically challenging exam due to inability to reposition patient and patient's body habitus. Increased renal cortical echogenicity compatible with medical renal disease. Moderate right hydronephrosis without visible shadowing calculus. Distal calculus is not excluded. Electronically Signed   By: Lovena Le M.D.   On: 03/17/2020 00:32   ECHOCARDIOGRAM COMPLETE  Result Date: 03/17/2020    ECHOCARDIOGRAM REPORT   Patient Name:   [name] [name] Date of Exam: 03/17/2020 Medical Rec #:  [id number]        Height:       68.0 in Accession #:    [id number]       Weight:       224.7 lb Date of Birth:  [birth year]        BSA:          2.147 m Patient Age:    23 years         BP:           173/97 mmHg Patient Gender: M                HR:           66 bpm. Exam Location:  Inpatient Procedure: 2D Echo, Cardiac  Doppler and Color Doppler Indications:    Elevated Troponin  History:        Patient has no prior history of Echocardiogram examinations.                 Arrythmias:RBBB; Signs/Symptoms:Murmur. Cancer.  Sonographer:    Jonelle Sidle Dance Referring Phys: [id number] [place]  1. Left ventricular ejection fraction, by estimation, is 60 to 65%. The left ventricle has normal function. The left ventricle has no regional wall motion abnormalities. Left ventricular diastolic parameters are consistent with Grade II diastolic dysfunction (pseudonormalization).  2. Right ventricular systolic function is normal. The right ventricular size is normal. There is normal pulmonary artery systolic pressure.  3. The mitral valve is grossly normal. No evidence of mitral valve regurgitation. No evidence of mitral stenosis.  4. The aortic valve is grossly normal. Aortic valve regurgitation is not visualized. No aortic stenosis is present. FINDINGS  Left Ventricle: Left ventricular ejection fraction, by estimation, is 60 to 65%. The left ventricle has normal function. The left ventricle has no regional wall motion abnormalities. The left ventricular internal cavity size was normal in size. There is  no left ventricular hypertrophy. Left ventricular diastolic parameters are consistent with Grade II diastolic dysfunction (pseudonormalization). Right Ventricle: The right ventricular size is normal. No increase in right ventricular wall thickness. Right ventricular systolic function is normal. There is normal pulmonary artery systolic pressure. The tricuspid regurgitant velocity is 2.26 m/s, and  with an assumed right atrial pressure of 8 mmHg, the estimated right ventricular systolic pressure is 20.9 mmHg. Left Atrium: Left atrial size was normal in size. Right Atrium: Right atrial size was normal in size. Pericardium: There is no evidence of pericardial effusion. Mitral Valve: The mitral valve is grossly normal. No evidence of  mitral valve regurgitation. No evidence of mitral valve stenosis. Tricuspid Valve: The tricuspid valve is grossly normal. Tricuspid valve regurgitation is trivial. No evidence of tricuspid stenosis. Aortic Valve: The aortic valve is grossly normal. Aortic valve regurgitation is not visualized. Aortic regurgitation PHT measures 744 msec. No aortic stenosis is present. There is mild calcification of the aortic valve. Aortic valve mean gradient measures 7.0 mmHg. Aortic valve peak gradient measures 14.4 mmHg. Aortic valve area, by VTI measures 2.16 cm. Pulmonic Valve: The pulmonic valve was grossly normal. Pulmonic valve regurgitation is not visualized. No evidence of pulmonic stenosis. Aorta: The aortic root and ascending aorta are structurally normal, with no evidence of dilitation. IAS/Shunts: The atrial septum is grossly normal.  LEFT VENTRICLE PLAX 2D LVIDd:         3.80 cm  Diastology LVIDs:         3.20 cm  LV e' lateral:   7.07 cm/s LV PW:         1.20 cm  LV E/e' lateral: 17.1 LV IVS:        1.10 cm  LV e' medial:    6.42 cm/s LVOT diam:     2.00  cm  LV E/e' medial:  18.8 LV SV:         80 LV SV Index:   37 LVOT Area:     3.14 cm  RIGHT VENTRICLE             IVC RV Basal diam:  2.90 cm     IVC diam: 2.10 cm RV S prime:     12.30 cm/s TAPSE (M-mode): 1.7 cm LEFT ATRIUM             Index       RIGHT ATRIUM           Index LA diam:        4.40 cm 2.05 cm/m  RA Area:     12.80 cm LA Vol (A2C):   77.3 ml 36.00 ml/m RA Volume:   25.30 ml  11.78 ml/m LA Vol (A4C):   38.1 ml 17.74 ml/m LA Biplane Vol: 56.4 ml 26.26 ml/m  AORTIC VALVE AV Area (Vmax):    1.97 cm AV Area (Vmean):   1.86 cm AV Area (VTI):     2.16 cm AV Vmax:           189.50 cm/s AV Vmean:          122.000 cm/s AV VTI:            0.372 m AV Peak Grad:      14.4 mmHg AV Mean Grad:      7.0 mmHg LVOT Vmax:         119.00 cm/s LVOT Vmean:        72.400 cm/s LVOT VTI:          0.256 m LVOT/AV VTI ratio: 0.69 AI PHT:            744 msec  AORTA Ao  Root diam: 3.80 cm Ao Asc diam:  3.40 cm MITRAL VALVE                TRICUSPID VALVE MV Area (PHT): 2.60 cm     TR Peak grad:   20.4 mmHg MV Decel Time: 292 msec     TR Vmax:        226.00 cm/s MV E velocity: 121.00 cm/s MV A velocity: 116.00 cm/s  SHUNTS MV E/A ratio:  1.04         Systemic VTI:  0.26 m                             Systemic Diam: 2.00 cm Mertie Moores MD Electronically signed by Mertie Moores MD Signature Date/Time: 03/17/2020/3:38:44 PM    Final         Scheduled Meds:  enoxaparin (LOVENOX) injection  40 mg Subcutaneous QHS   levothyroxine  150 mcg Oral QAC breakfast   loratadine  10 mg Oral Daily   meloxicam  15 mg Oral Daily   metoprolol tartrate  12.5 mg Oral BID   mirabegron ER  50 mg Oral Daily   multivitamin with minerals  1 tablet Oral Once per day on Mon Thu   Continuous Infusions:  sodium chloride Stopped (03/18/20 0507)   sodium chloride 50 mL/hr at 03/18/20 1702   cefTRIAXone (ROCEPHIN)  IV 1 g (03/18/20 0507)     LOS: 2 days    Time spent:40 min    Ayan Yankey, Geraldo Docker, MD [facility] Pager [phone]  If 7PM-7AM, please contact night-coverage [email or web address] Password [facility] 03/18/2020, 7:41 PM

## 2020-03-19 ENCOUNTER — Encounter (HOSPITAL_COMMUNITY)
Admission: EM | Disposition: A | Discharge status: Home Health | Payer: Self-pay | Source: Home / Self Care | Attending: Internal Medicine

## 2020-03-19 ENCOUNTER — Inpatient Hospital Stay (HOSPITAL_COMMUNITY): Payer: Medicare HMO | Admitting: Certified Registered Nurse Anesthetist

## 2020-03-19 ENCOUNTER — Inpatient Hospital Stay (HOSPITAL_COMMUNITY): Payer: Medicare HMO

## 2020-03-19 ENCOUNTER — Encounter (HOSPITAL_COMMUNITY): Payer: Self-pay | Admitting: Internal Medicine

## 2020-03-19 HISTORY — PX: CYSTOSCOPY W/ URETERAL STENT PLACEMENT: SHX1429

## 2020-03-19 LAB — CBC WITH DIFFERENTIAL/PLATELET
Abs Immature Granulocytes: 0.03 10*3/uL (ref 0.00–0.07)
Basophils Absolute: 0 10*3/uL (ref 0.0–0.1)
Basophils Relative: 0 %
Eosinophils Absolute: 0.1 10*3/uL (ref 0.0–0.5)
Eosinophils Relative: 2 %
HCT: 33.6 % — ABNORMAL LOW (ref 39.0–52.0)
Hemoglobin: 10.6 g/dL — ABNORMAL LOW (ref 13.0–17.0)
Immature Granulocytes: 1 %
Lymphocytes Relative: 15 %
Lymphs Abs: 0.8 10*3/uL (ref 0.7–4.0)
MCH: 31.9 pg (ref 26.0–34.0)
MCHC: 31.5 g/dL (ref 30.0–36.0)
MCV: 101.2 fL — ABNORMAL HIGH (ref 80.0–100.0)
Monocytes Absolute: 0.6 10*3/uL (ref 0.1–1.0)
Monocytes Relative: 10 %
Neutro Abs: 3.9 10*3/uL (ref 1.7–7.7)
Neutrophils Relative %: 72 %
Platelets: 129 10*3/uL — ABNORMAL LOW (ref 150–400)
RBC: 3.32 MIL/uL — ABNORMAL LOW (ref 4.22–5.81)
RDW: 13.3 % (ref 11.5–15.5)
WBC: 5.4 10*3/uL (ref 4.0–10.5)
nRBC: 0.4 % — ABNORMAL HIGH (ref 0.0–0.2)

## 2020-03-19 LAB — COMPREHENSIVE METABOLIC PANEL
ALT: 12 U/L (ref 0–44)
AST: 14 U/L — ABNORMAL LOW (ref 15–41)
Albumin: 2.8 g/dL — ABNORMAL LOW (ref 3.5–5.0)
Alkaline Phosphatase: 56 U/L (ref 38–126)
Anion gap: 11 (ref 5–15)
BUN: 16 mg/dL (ref 8–23)
CO2: 21 mmol/L — ABNORMAL LOW (ref 22–32)
Calcium: 8.6 mg/dL — ABNORMAL LOW (ref 8.9–10.3)
Chloride: 106 mmol/L (ref 98–111)
Creatinine, Ser: 0.95 mg/dL (ref 0.61–1.24)
GFR calc Af Amer: >60 mL/min (ref >60)
GFR calc non Af Amer: >60 mL/min (ref >60)
Glucose, Bld: 94 mg/dL (ref 70–99)
Potassium: 4.3 mmol/L (ref 3.5–5.1)
Sodium: 138 mmol/L (ref 135–145)
Total Bilirubin: 0.8 mg/dL (ref 0.3–1.2)
Total Protein: 5.6 g/dL — ABNORMAL LOW (ref 6.5–8.1)

## 2020-03-19 LAB — URINE CULTURE: Culture: >=100000 — AB

## 2020-03-19 LAB — MAGNESIUM: Magnesium: 1.9 mg/dL (ref 1.7–2.4)

## 2020-03-19 LAB — PROCALCITONIN: Procalcitonin: 0.54 ng/mL

## 2020-03-19 LAB — PHOSPHORUS: Phosphorus: 2.9 mg/dL (ref 2.5–4.6)

## 2020-03-19 SURGERY — CYSTOSCOPY, WITH RETROGRADE PYELOGRAM AND URETERAL STENT INSERTION
Anesthesia: General | Site: Ureter | Laterality: Bilateral

## 2020-03-19 MED ORDER — CHLORHEXIDINE GLUCONATE 0.12 % MT SOLN
15.0000 mL | Freq: Once | OROMUCOSAL | Status: AC
  Administered 2020-03-19: 15 mL via OROMUCOSAL
  Filled 2020-03-19: qty 15

## 2020-03-19 MED ORDER — LIDOCAINE 2% (20 MG/ML) 5 ML SYRINGE
INTRAMUSCULAR | Status: AC
  Filled 2020-03-19: qty 5

## 2020-03-19 MED ORDER — FENTANYL CITRATE (PF) 250 MCG/5ML IJ SOLN
INTRAMUSCULAR | Status: AC
  Filled 2020-03-19: qty 5

## 2020-03-19 MED ORDER — PROPOFOL 10 MG/ML IV BOLUS
INTRAVENOUS | Status: DC | PRN
  Administered 2020-03-19: 120 mg via INTRAVENOUS
  Administered 2020-03-19: 30 mg via INTRAVENOUS

## 2020-03-19 MED ORDER — LIDOCAINE HCL URETHRAL/MUCOSAL 2 % EX GEL
CUTANEOUS | Status: AC
  Filled 2020-03-19: qty 11

## 2020-03-19 MED ORDER — DEXAMETHASONE SODIUM PHOSPHATE 10 MG/ML IJ SOLN
INTRAMUSCULAR | Status: DC | PRN
  Administered 2020-03-19: 4 mg via INTRAVENOUS

## 2020-03-19 MED ORDER — ACETAMINOPHEN 10 MG/ML IV SOLN
INTRAVENOUS | Status: AC
  Filled 2020-03-19: qty 100

## 2020-03-19 MED ORDER — LACTATED RINGERS IV SOLN: INTRAVENOUS | Status: DC

## 2020-03-19 MED ORDER — SODIUM CHLORIDE 0.9 % IV SOLN
INTRAVENOUS | Status: DC | PRN
  Administered 2020-03-19: 60 mL

## 2020-03-19 MED ORDER — 0.9 % SODIUM CHLORIDE (POUR BTL) OPTIME
TOPICAL | Status: DC | PRN
  Administered 2020-03-19: 1000 mL

## 2020-03-19 MED ORDER — CHLORHEXIDINE GLUCONATE CLOTH 2 % EX PADS
6.0000 | MEDICATED_PAD | Freq: Every day | CUTANEOUS | Status: DC
  Administered 2020-03-19 – 2020-03-20 (×2): 6 via TOPICAL

## 2020-03-19 MED ORDER — LIDOCAINE 2% (20 MG/ML) 5 ML SYRINGE
INTRAMUSCULAR | Status: DC | PRN
  Administered 2020-03-19: 60 mg via INTRAVENOUS
  Administered 2020-03-19: 40 mg via INTRAVENOUS

## 2020-03-19 MED ORDER — PROPOFOL 10 MG/ML IV BOLUS
INTRAVENOUS | Status: AC
  Filled 2020-03-19: qty 20

## 2020-03-19 MED ORDER — FENTANYL CITRATE (PF) 100 MCG/2ML IJ SOLN
INTRAMUSCULAR | Status: DC | PRN
  Administered 2020-03-19 (×2): 25 ug via INTRAVENOUS

## 2020-03-19 MED ORDER — ACETAMINOPHEN 10 MG/ML IV SOLN
INTRAVENOUS | Status: DC | PRN
  Administered 2020-03-19: 1000 mg via INTRAVENOUS

## 2020-03-19 MED ORDER — PHENYLEPHRINE 40 MCG/ML (10ML) SYRINGE FOR IV PUSH (FOR BLOOD PRESSURE SUPPORT)
PREFILLED_SYRINGE | INTRAVENOUS | Status: DC | PRN
  Administered 2020-03-19 (×3): 80 ug via INTRAVENOUS

## 2020-03-19 MED ORDER — WATER FOR IRRIGATION, STERILE IR SOLN
Status: DC | PRN
  Administered 2020-03-19: 3000 mL via SURGICAL_CAVITY

## 2020-03-19 MED ORDER — FENTANYL CITRATE (PF) 100 MCG/2ML IJ SOLN: 25.0000 ug | INTRAMUSCULAR | Status: DC | PRN

## 2020-03-19 MED ORDER — ESMOLOL HCL 100 MG/10ML IV SOLN
INTRAVENOUS | Status: DC | PRN
Start: 2020-03-19 — End: 2020-03-19
  Administered 2020-03-19: 20 mg via INTRAVENOUS

## 2020-03-19 SURGICAL SUPPLY — 24 items
BAG DRN RND TRDRP ANRFLXCHMBR (UROLOGICAL SUPPLIES) ×1
BAG URINE DRAIN 2000ML AR STRL (UROLOGICAL SUPPLIES) ×2 IMPLANT
BAG URO CATCHER STRL LF (MISCELLANEOUS) ×2 IMPLANT
CATH FOLEY 2WAY SLVR  5CC 16FR (CATHETERS)
CATH FOLEY 2WAY SLVR 18FR 30CC (CATHETERS) ×1 IMPLANT
CATH FOLEY 2WAY SLVR 5CC 16FR (CATHETERS) IMPLANT
CATH INTERMIT  6FR 70CM (CATHETERS) ×2 IMPLANT
GLOVE BIO SURGEON STRL SZ7.5 (GLOVE) ×2 IMPLANT
GOWN STRL REUS W/ TWL LRG LVL3 (GOWN DISPOSABLE) ×1 IMPLANT
GOWN STRL REUS W/ TWL XL LVL3 (GOWN DISPOSABLE) ×1 IMPLANT
GOWN STRL REUS W/TWL LRG LVL3 (GOWN DISPOSABLE) ×2
GOWN STRL REUS W/TWL XL LVL3 (GOWN DISPOSABLE) ×2
GUIDEWIRE ANG ZIPWIRE 038X150 (WIRE) IMPLANT
GUIDEWIRE STR DUAL SENSOR (WIRE) ×2 IMPLANT
KIT TURNOVER KIT B (KITS) ×2 IMPLANT
MANIFOLD NEPTUNE II (INSTRUMENTS) ×2 IMPLANT
NS IRRIG 1000ML POUR BTL (IV SOLUTION) ×2 IMPLANT
PACK CYSTO (CUSTOM PROCEDURE TRAY) ×2 IMPLANT
STENT URET 6FRX24 CONTOUR (STENTS) IMPLANT
STENT URET 6FRX26 CONTOUR (STENTS) ×1 IMPLANT
SYPHON OMNI JUG (MISCELLANEOUS) ×2 IMPLANT
TOWEL GREEN STERILE FF (TOWEL DISPOSABLE) ×2 IMPLANT
TUBE CONNECTING 12X1/4 (SUCTIONS) IMPLANT
WATER STERILE IRR 3000ML UROMA (IV SOLUTION) ×2 IMPLANT

## 2020-03-19 NOTE — Transfer of Care (Signed)
Immediate Anesthesia Transfer of Care Note  Patient: [name] [name]  Procedure(s) Performed: CYSTOSCOPY WITH RETROGRADE PYELOGRAM/URETERAL STENT PLACEMENT (Bilateral Ureter)  Patient Location: PACU  Anesthesia Type:General  Level of Consciousness: awake  Airway & Oxygen Therapy: Patient Spontanous Breathing  Post-op Assessment: Report given to RN and Post -op Vital signs reviewed and stable  Post vital signs: Reviewed and stable  Last Vitals:  Vitals Value Taken Time  BP 141/70 03/19/20 1809  Temp    Pulse 69 03/19/20 1810  Resp 21 03/19/20 1810  SpO2 99 % 03/19/20 1810  Vitals shown include unvalidated device data.  Last Pain:  Vitals:   03/19/20 1141  TempSrc: Oral  PainSc:       Patients Stated Pain Goal: 2 (89/79/15 0413)  Complications: No complications documented.

## 2020-03-19 NOTE — Progress Notes (Addendum)
Inpatient Rehab Admissions Coordinator Note:   Per PT recommendations, pt was screened for CIR candidacy by Shann Medal, PT, DPT.  At this time we are recommending a CIR consult.  Will place order per protocol.  Please contact me with questions.   Shann Medal, PT, DPT [phone] 03/19/20 12:43 PM

## 2020-03-19 NOTE — Progress Notes (Signed)
Subjective: CC: recent fall.  Hx: [name] [name] was admitted to the hospital on 03/16/20 following a fall and a possible STEMI.  He was noted to have a low grade fever and had an infected appearing urine.  He was given Vanc and Cefepime in the ER.  Cardiology felt the EKG findings were only and cath was not performed.   He had a renal US on 6/23 that showed right hydro.  A CT last night demonstrated severe right hydro with small renal stones and a 60m right UVJ stone with a larger stone proximal to that.   He had a left distal stone with mild hydro.  He has had no nausea.  He has no flank pain.  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   He was seen recently in the office and his PSA was up to 52.   CT shows progressive adenopathy, a pubic bone met and a pancreatic mass that was also present on CT in 12/20.   His urine is chronically abnormal but he generally grows Mx species.   He has a history of stones with multiple prior procedures.   ROS:  Review of Systems  Constitutional: Positive for fever and malaise/fatigue.  Respiratory: Negative for shortness of breath.   Cardiovascular: Negative for chest pain.  Gastrointestinal: Negative for abdominal pain and vomiting.    Anti-infectives: Anti-infectives (From admission, onward)   Start     Dose/Rate Route Frequency Ordered Stop   03/17/20 0600  cefTRIAXone (ROCEPHIN) 1 g in sodium chloride 0.9 % 100 mL IVPB     Discontinue     1 g 200 mL/hr over 30 Minutes Intravenous Every 24 hours 03/16/20 2349     03/16/20 2300  ceFEPIme (MAXIPIME) 1 g in sodium chloride 0.9 % 100 mL IVPB        1 g 200 mL/hr over 30 Minutes Intravenous  Once 03/16/20 2228 03/17/20 0007   03/16/20 2230  vancomycin (VANCOCIN) IVPB 1000 mg/200 mL premix  Status:  Discontinued        1,000 mg 200 mL/hr over 60 Minutes Intravenous  Once 03/16/20 2228 03/16/20 2349      Current  Facility-Administered Medications  Medication Dose Route Frequency Provider Last Rate Last Admin  . 0.9 %  sodium chloride infusion   Intravenous PRN WAllie Bossier MD 10 mL/hr at 03/19/20 0518 1,000 mL at 03/19/20 0518  . 0.9 %  sodium chloride infusion   Intravenous Continuous WAllie Bossier MD 50 mL/hr at 03/19/20 0300 Rate Verify at 03/19/20 0300  . acetaminophen (TYLENOL) tablet 650 mg  650 mg Oral Q6H PRN GEtta Quill DO       Or  . acetaminophen (TYLENOL) suppository 650 mg  650 mg Rectal Q6H PRN GEtta Quill DO      . albuterol (PROVENTIL) (2.5 MG/3ML) 0.083% nebulizer solution 2.5 mg  2.5 mg Inhalation Q6H PRN GEtta Quill DO      . cefTRIAXone (ROCEPHIN) 1 g in sodium chloride 0.9 % 100 mL IVPB  1 g Intravenous Q24H GJennette KettleM, DO 200 mL/hr at 03/19/20 0520 1 g at 03/19/20 0520  . enoxaparin (LOVENOX) injection 40 mg  40 mg Subcutaneous QHS GJennette KettleM, DO   40 mg at 03/18/20 2036  . fluticasone (FLONASE) 50 MCG/ACT nasal spray 2 spray  2 spray Each Nare Daily PRN GEtta Quill DO      .  levothyroxine (SYNTHROID) tablet 150 mcg  150 mcg Oral QAC breakfast Hillary Bow, DO   150 mcg at 03/19/20 0517  . loratadine (CLARITIN) tablet 10 mg  10 mg Oral Daily Lyda Perone M, DO   10 mg at 03/18/20 0848  . meloxicam (MOBIC) tablet 15 mg  15 mg Oral Daily Lyda Perone M, DO   15 mg at 03/18/20 0849  . metoprolol tartrate (LOPRESSOR) tablet 12.5 mg  12.5 mg Oral BID Drema Dallas, MD   12.5 mg at 03/18/20 1735  . mirabegron ER (MYRBETRIQ) tablet 50 mg  50 mg Oral Daily Lyda Perone M, DO   50 mg at 03/18/20 0849  . multivitamin with minerals tablet 1 tablet  1 tablet Oral Once per day on Mon Thu Gardner, Jared M, DO   1 tablet at 03/18/20 0848  . ondansetron (ZOFRAN) tablet 4 mg  4 mg Oral Q6H PRN Hillary Bow, DO       Or  . ondansetron [facility]) injection 4 mg  4 mg Intravenous Q6H PRN Hillary Bow, DO      . oxyCODONE-acetaminophen  (PERCOCET/ROXICET) 5-325 MG per tablet 1 tablet  1 tablet Oral Q6H PRN Hillary Bow, DO   1 tablet at 03/18/20 2036  . polyethylene glycol (MIRALAX / GLYCOLAX) packet 17 g  17 g Oral Daily PRN Shalhoub, Deno Lunger, MD         Objective: Vital signs in last 24 hours: Temp:  [98.2 F (36.8 C)-99.4 F (37.4 C)] 99.4 F (37.4 C) (06/25 0416) Pulse Rate:  [68-76] 68 (06/25 0416) Resp:  [18-20] 20 (06/25 0416) BP: (145-159)/(69-88) 159/88 (06/25 0416) SpO2:  [96 %-100 %] 97 % (06/25 0416) Weight:  [98.7 kg] 98.7 kg (06/25 0432)  Intake/Output from previous day: 06/24 0701 - 06/25 0700 In: 1039.7 [P.O.:420; I.V.:519.7; IV Piggyback:100] Out: 2200 [Urine:2200] Intake/Output this shift: No intake/output data recorded.   Physical Exam Vitals reviewed.  Constitutional:      Appearance: Normal appearance.  Cardiovascular:     Rate and Rhythm: Normal rate and regular rhythm.  Pulmonary:     Effort: Pulmonary effort is normal. No respiratory distress.  Abdominal:     Palpations: Abdomen is soft.     Tenderness: There is abdominal tenderness (RUQ).  Musculoskeletal:        General: No swelling or tenderness. Normal range of motion.  Skin:    General: Skin is warm and dry.  Neurological:     General: No focal deficit present.     Mental Status: He is alert and oriented to person, place, and time.  Psychiatric:        Mood and Affect: Mood normal.     Lab Results:  Recent Labs    03/18/20 0428 03/19/20 0606  WBC 5.8 5.4  HGB 10.6* 10.6*  HCT 35.1* 33.6*  PLT 125* 129*   BMET Recent Labs    03/18/20 0428 03/19/20 0606  NA 138 138  K 4.7 4.3  CL 109 106  CO2 18* 21*  GLUCOSE 105* 94  BUN 23 16  CREATININE 1.45* 0.95  CALCIUM 8.6* 8.6*   PT/INR No results for input(s): LABPROT, INR in the last 72 hours. ABG No results for input(s): PHART, HCO3 in the last 72 hours.  Invalid input(s): PCO2, PO2  Studies/Results: ECHOCARDIOGRAM COMPLETE  Result Date:  03/17/2020    ECHOCARDIOGRAM REPORT   Patient Name:   [name] [name] Date of Exam: 03/17/2020 Medical Rec #:  [id number]        Height:       68.0 in Accession #:    [id number]       Weight:       224.7 lb Date of Birth:  01/25/1932        BSA:          2.147 m Patient Age:    86 years         BP:           173/97 mmHg Patient Gender: M                HR:           66 bpm. Exam Location:  Inpatient Procedure: 2D Echo, Cardiac Doppler and Color Doppler Indications:    Elevated Troponin  History:        Patient has no prior history of Echocardiogram examinations.                 Arrythmias:RBBB; Signs/Symptoms:Murmur. Cancer.  Sonographer:    Jonelle Sidle Dance Referring Phys: [id number] [place]  1. Left ventricular ejection fraction, by estimation, is 60 to 65%. The left ventricle has normal function. The left ventricle has no regional wall motion abnormalities. Left ventricular diastolic parameters are consistent with Grade II diastolic dysfunction (pseudonormalization).  2. Right ventricular systolic function is normal. The right ventricular size is normal. There is normal pulmonary artery systolic pressure.  3. The mitral valve is grossly normal. No evidence of mitral valve regurgitation. No evidence of mitral stenosis.  4. The aortic valve is grossly normal. Aortic valve regurgitation is not visualized. No aortic stenosis is present. FINDINGS  Left Ventricle: Left ventricular ejection fraction, by estimation, is 60 to 65%. The left ventricle has normal function. The left ventricle has no regional wall motion abnormalities. The left ventricular internal cavity size was normal in size. There is  no left ventricular hypertrophy. Left ventricular diastolic parameters are consistent with Grade II diastolic dysfunction (pseudonormalization). Right Ventricle: The right ventricular size is normal. No increase in right ventricular wall thickness. Right ventricular systolic function is normal. There is normal  pulmonary artery systolic pressure. The tricuspid regurgitant velocity is 2.26 m/s, and  with an assumed right atrial pressure of 8 mmHg, the estimated right ventricular systolic pressure is 85.4 mmHg. Left Atrium: Left atrial size was normal in size. Right Atrium: Right atrial size was normal in size. Pericardium: There is no evidence of pericardial effusion. Mitral Valve: The mitral valve is grossly normal. No evidence of mitral valve regurgitation. No evidence of mitral valve stenosis. Tricuspid Valve: The tricuspid valve is grossly normal. Tricuspid valve regurgitation is trivial. No evidence of tricuspid stenosis. Aortic Valve: The aortic valve is grossly normal. Aortic valve regurgitation is not visualized. Aortic regurgitation PHT measures 744 msec. No aortic stenosis is present. There is mild calcification of the aortic valve. Aortic valve mean gradient measures 7.0 mmHg. Aortic valve peak gradient measures 14.4 mmHg. Aortic valve area, by VTI measures 2.16 cm. Pulmonic Valve: The pulmonic valve was grossly normal. Pulmonic valve regurgitation is not visualized. No evidence of pulmonic stenosis. Aorta: The aortic root and ascending aorta are structurally normal, with no evidence of dilitation. IAS/Shunts: The atrial septum is grossly normal.  LEFT VENTRICLE PLAX 2D LVIDd:         3.80 cm  Diastology LVIDs:         3.20 cm  LV e' lateral:   7.07 cm/s LV PW:  1.20 cm  LV E/e' lateral: 17.1 LV IVS:        1.10 cm  LV e' medial:    6.42 cm/s LVOT diam:     2.00 cm  LV E/e' medial:  18.8 LV SV:         80 LV SV Index:   37 LVOT Area:     3.14 cm  RIGHT VENTRICLE             IVC RV Basal diam:  2.90 cm     IVC diam: 2.10 cm RV S prime:     12.30 cm/s TAPSE (M-mode): 1.7 cm LEFT ATRIUM             Index       RIGHT ATRIUM           Index LA diam:        4.40 cm 2.05 cm/m  RA Area:     12.80 cm LA Vol (A2C):   77.3 ml 36.00 ml/m RA Volume:   25.30 ml  11.78 ml/m LA Vol (A4C):   38.1 ml 17.74 ml/m LA  Biplane Vol: 56.4 ml 26.26 ml/m  AORTIC VALVE AV Area (Vmax):    1.97 cm AV Area (Vmean):   1.86 cm AV Area (VTI):     2.16 cm AV Vmax:           189.50 cm/s AV Vmean:          122.000 cm/s AV VTI:            0.372 m AV Peak Grad:      14.4 mmHg AV Mean Grad:      7.0 mmHg LVOT Vmax:         119.00 cm/s LVOT Vmean:        72.400 cm/s LVOT VTI:          0.256 m LVOT/AV VTI ratio: 0.69 AI PHT:            744 msec  AORTA Ao Root diam: 3.80 cm Ao Asc diam:  3.40 cm MITRAL VALVE                TRICUSPID VALVE MV Area (PHT): 2.60 cm     TR Peak grad:   20.4 mmHg MV Decel Time: 292 msec     TR Vmax:        226.00 cm/s MV E velocity: 121.00 cm/s MV A velocity: 116.00 cm/s  SHUNTS MV E/A ratio:  1.04         Systemic VTI:  0.26 m                             Systemic Diam: 2.00 cm Mertie Moores MD Electronically signed by Mertie Moores MD Signature Date/Time: 03/17/2020/3:38:44 PM    Final    CT RENAL STONE STUDY  Result Date: 03/18/2020 CLINICAL DATA:  Hydronephrosis on renal ultrasound yesterday. EXAM: CT ABDOMEN AND PELVIS WITHOUT CONTRAST TECHNIQUE: Multidetector CT imaging of the abdomen and pelvis was performed following the standard protocol without IV contrast. COMPARISON:  Renal ultrasound yesterday.  Abdominal CT 09/23/2019 FINDINGS: Lower chest: Right calcified pleural plaques. There are small bilateral pleural effusions, right greater than left. Mitral annulus calcifications, coronary artery calcifications. Normal heart size. Mild distal esophageal wall thickening. Hepatobiliary: No evidence of focal hepatic lesion on noncontrast exam. Gallbladder physiologically distended, no calcified stone. No biliary dilatation. Pancreas: Hypodense lesion in the pancreatic  tail measures approximately 2.7 cm, this is less well-defined currently than on previous in the absence of IV contrast. No ductal dilatation or inflammation. Spleen: Normal in size without focal abnormality. Adrenals/Urinary Tract: No adrenal nodule.  Obstructing 4 mm stone at the right ureterovesicular junction with moderate to severe hydroureteronephrosis. There are additional dependent contiguous stones within the distal right ureter that span 2 cm. Mild right perinephric edema. There are 3 nonobstructing stones in the right kidney measuring 4 and 5 mm. 9 mm hyperdense lesion in the posterior right kidney. Mild left hydroureteronephrosis. This has slightly progressed from prior exam. There is a 6 mm stone in the distal left ureter approximately 9 mm from the bladder insertion, not definitively cause for obstruction of the ureter is dilated distal to this. Mild left perinephric edema. Multiple nonobstructing stones in the left kidney. Urinary bladder is partially distended. No definite bladder wall thickening or bladder stone. Stomach/Bowel: Mild distal colonic diverticulosis without diverticulitis. There is no evidence of bowel obstruction or inflammation. Normal appendix. Stomach is unremarkable. Vascular/Lymphatic: Aorto bi-iliac atherosclerosis. Progressive retroperitoneal adenopathy from prior exam. Left common iliac node currently measures 18 mm, series 3, image 62, previously 15 mm. Left internal iliac nodes are difficult to assess given lack of IV contrast on the current exam but appear progressed. There is new adenopathy in the upper left periaortic station the level of the duodenum with 17 mm lymph node, series 3, image 47. Detailed assessment for adenopathy is inherently limited in the absence of IV contrast. Reproductive: Brachytherapy seeds in the prostate. Other: Bilateral perinephric edema, right greater than left. Inflammatory change about the right pericolic gutter. No ascites or free air. No evidence of intra-abdominal fluid collections. Patchy air in the anterior abdominal wall typical of medication injection site. Musculoskeletal: Right hip arthroplasty. Degenerative change in the left hip. Lytic lesion involving the left pubic body for  example series 6, image 59 and series 3, image 90. Soft tissue component with loss of cortex posteriorly. No evidence of pathologic fracture. No evidence of additional osseous lesion. Remote left posterior rib fractures. Diffuse degenerative change throughout the spine. IMPRESSION: 1. Obstructing 4 mm stone at the right ureterovesicular junction with moderate to severe right hydroureteronephrosis. There are additional dependent contiguous stones within the distal right ureter that span 2 cm. 2. Mild left hydroureteronephrosis has slightly progressed from prior exam. There is a 6 mm stone in the distal left ureter approximately 9 mm from the bladder insertion, not definitively cause for obstruction as the ureter is dilated distal to this. 3. Bilateral nonobstructing renal calculi. 4. Hypodense lesion in the pancreatic tail measuring 2.7 cm, less well-defined currently than on previous exam in the absence of IV contrast. Further evaluation with MRI on an elective basis again recommended. 5. Progressive retroperitoneal adenopathy, suspicious for metastatic disease. Increased size of prior lymph nodes as well as new lymphadenopathy. 6. Lytic lesion involving the left pubic body highly suspicious for metastatic disease. No evidence of pathologic fracture. This may be related to known prostatic malignancy, however is unusual as prostate metastasis are typically sclerotic. 7. Small bilateral pleural effusions, right greater than left. Aortic Atherosclerosis (ICD10-I70.0). Electronically Signed   By: Keith Rake M.D.   On: 03/18/2020 20:40     Assessment and Plan: 1.  Sepsis of urinary origin with a 69m RUVJ stone and obstruction with additional right distal stones and left distal stones with minimal obstruction.  He is responding to abx with reduced fever and stable vitals.  I have made him NPO and Dr. Gloriann Loan will take him to the OR later today to attempt cystoscopy with stent insertion bilaterally, but if that is  not successful, he may need a right perc.  He will need ureteroscopy once the infection has been cleared.   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  He is on trelstar but off of Xtandi and is to f/u with Dr. Alen Blew for consideration of further therapy.   3. Pancreatic mass.        LOS: 3 days    [name] 03/19/2020 [phone] ID: [name] [name], male   DOB: 12-22-1931, 86 y.o.   MRN: [id number]

## 2020-03-19 NOTE — Anesthesia Preprocedure Evaluation (Signed)
Anesthesia Evaluation  Patient identified by MRN, date of birth, ID band Patient awake    Reviewed: Allergy & Precautions, NPO status , Patient's Chart, lab work & pertinent test results  Airway Mallampati: III  TM Distance: >3 FB Neck ROM: Full    Dental  (+) Teeth Intact, Dental Advisory Given   Pulmonary asthma ,     + decreased breath sounds      Cardiovascular hypertension, + dysrhythmias  Rhythm:Regular Rate:Normal     Neuro/Psych negative neurological ROS  negative psych ROS   GI/Hepatic negative GI ROS, Neg liver ROS,   Endo/Other  Hypothyroidism   Renal/GU Renal disease     Musculoskeletal  (+) Arthritis ,   Abdominal (+) + obese,   Peds  Hematology   Anesthesia Other Findings   Reproductive/Obstetrics                             Anesthesia Physical Anesthesia Plan  ASA: III  Anesthesia Plan: General   Post-op Pain Management:    Induction: Intravenous  PONV Risk Score and Plan: 3 and Ondansetron and Treatment may vary due to age or medical condition  Airway Management Planned: LMA  Additional Equipment: None  Intra-op Plan:   Post-operative Plan: Extubation in OR  Informed Consent: I have reviewed the patients History and Physical, chart, labs and discussed the procedure including the risks, benefits and alternatives for the proposed anesthesia with the patient or authorized representative who has indicated his/her understanding and acceptance.     Dental advisory given  Plan Discussed with: CRNA  Anesthesia Plan Comments: (Lab Results      Component                Value               Date                      WBC                      5.4                 03/19/2020                HGB                      10.6 (L)            03/19/2020                HCT                      33.6 (L)            03/19/2020                MCV                      101.2 (H)            03/19/2020                PLT                      129 (L)             03/19/2020             Echo:  1. Left ventricular ejection fraction, by estimation, is 60 to 65%. The  left ventricle has normal function. The left ventricle has no regional  wall motion abnormalities. Left ventricular diastolic parameters are  consistent with Grade II diastolic  dysfunction (pseudonormalization).  2. Right ventricular systolic function is normal. The right ventricular  size is normal. There is normal pulmonary artery systolic pressure.  3. The mitral valve is grossly normal. No evidence of mitral valve  regurgitation. No evidence of mitral stenosis.  4. The aortic valve is grossly normal. Aortic valve regurgitation is not  visualized. No aortic stenosis is present. )        Anesthesia Quick Evaluation

## 2020-03-19 NOTE — H&P (View-Only) (Signed)
Subjective: CC: recent fall.  Hx: [name] was admitted to the hospital on 03/16/20 following a fall and a possible STEMI.  He was noted to have a low grade fever and had an infected appearing urine.  He was given Vanc and Cefepime in the ER.  Cardiology felt the EKG findings were only and cath was not performed.   He had a renal US on 6/23 that showed right hydro.  A CT last night demonstrated severe right hydro with small renal stones and a 60m right UVJ stone with a larger stone proximal to that.   He had a left distal stone with mild hydro.  He has had no nausea.  He has no flank pain.  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   He was seen recently in the office and his PSA was up to 52.   CT shows progressive adenopathy, a pubic bone met and a pancreatic mass that was also present on CT in 12/20.   His urine is chronically abnormal but he generally grows Mx species.   He has a history of stones with multiple prior procedures.   ROS:  Review of Systems  Constitutional: Positive for fever and malaise/fatigue.  Respiratory: Negative for shortness of breath.   Cardiovascular: Negative for chest pain.  Gastrointestinal: Negative for abdominal pain and vomiting.    Anti-infectives: Anti-infectives (From admission, onward)   Start     Dose/Rate Route Frequency Ordered Stop   03/17/20 0600  cefTRIAXone (ROCEPHIN) 1 g in sodium chloride 0.9 % 100 mL IVPB     Discontinue     1 g 200 mL/hr over 30 Minutes Intravenous Every 24 hours 03/16/20 2349     03/16/20 2300  ceFEPIme (MAXIPIME) 1 g in sodium chloride 0.9 % 100 mL IVPB        1 g 200 mL/hr over 30 Minutes Intravenous  Once 03/16/20 2228 03/17/20 0007   03/16/20 2230  vancomycin (VANCOCIN) IVPB 1000 mg/200 mL premix  Status:  Discontinued        1,000 mg 200 mL/hr over 60 Minutes Intravenous  Once 03/16/20 2228 03/16/20 2349      Current  Facility-Administered Medications  Medication Dose Route Frequency Provider Last Rate Last Admin  . 0.9 %  sodium chloride infusion   Intravenous PRN WAllie Bossier MD 10 mL/hr at 03/19/20 0518 1,000 mL at 03/19/20 0518  . 0.9 %  sodium chloride infusion   Intravenous Continuous WAllie Bossier MD 50 mL/hr at 03/19/20 0300 Rate Verify at 03/19/20 0300  . acetaminophen (TYLENOL) tablet 650 mg  650 mg Oral Q6H PRN GEtta Quill DO       Or  . acetaminophen (TYLENOL) suppository 650 mg  650 mg Rectal Q6H PRN GEtta Quill DO      . albuterol (PROVENTIL) (2.5 MG/3ML) 0.083% nebulizer solution 2.5 mg  2.5 mg Inhalation Q6H PRN GEtta Quill DO      . cefTRIAXone (ROCEPHIN) 1 g in sodium chloride 0.9 % 100 mL IVPB  1 g Intravenous Q24H GJennette KettleM, DO 200 mL/hr at 03/19/20 0520 1 g at 03/19/20 0520  . enoxaparin (LOVENOX) injection 40 mg  40 mg Subcutaneous QHS GJennette KettleM, DO   40 mg at 03/18/20 2036  . fluticasone (FLONASE) 50 MCG/ACT nasal spray 2 spray  2 spray Each Nare Daily PRN GEtta Quill DO      .  levothyroxine (SYNTHROID) tablet 150 mcg  150 mcg Oral QAC breakfast Etta Quill, DO   150 mcg at 03/19/20 0517  . loratadine (CLARITIN) tablet 10 mg  10 mg Oral Daily Jennette Kettle M, DO   10 mg at 03/18/20 0848  . meloxicam (MOBIC) tablet 15 mg  15 mg Oral Daily Jennette Kettle M, DO   15 mg at 03/18/20 0849  . metoprolol tartrate (LOPRESSOR) tablet 12.5 mg  12.5 mg Oral BID Allie Bossier, MD   12.5 mg at 03/18/20 1735  . mirabegron ER (MYRBETRIQ) tablet 50 mg  50 mg Oral Daily Jennette Kettle M, DO   50 mg at 03/18/20 0849  . multivitamin with minerals tablet 1 tablet  1 tablet Oral Once per day on Mon Thu Gardner, Jared M, DO   1 tablet at 03/18/20 0848  . ondansetron (ZOFRAN) tablet 4 mg  4 mg Oral Q6H PRN Etta Quill, DO       Or  . ondansetron [facility]) injection 4 mg  4 mg Intravenous Q6H PRN Etta Quill, DO      . oxyCODONE-acetaminophen  (PERCOCET/ROXICET) 5-325 MG per tablet 1 tablet  1 tablet Oral Q6H PRN Etta Quill, DO   1 tablet at 03/18/20 2036  . polyethylene glycol (MIRALAX / GLYCOLAX) packet 17 g  17 g Oral Daily PRN Shalhoub, Sherryll Burger, MD         Objective: Vital signs in last 24 hours: Temp:  [98.2 F (36.8 C)-99.4 F (37.4 C)] 99.4 F (37.4 C) (06/25 0416) Pulse Rate:  [68-76] 68 (06/25 0416) Resp:  [18-20] 20 (06/25 0416) BP: (145-159)/(69-88) 159/88 (06/25 0416) SpO2:  [96 %-100 %] 97 % (06/25 0416) Weight:  [98.7 kg] 98.7 kg (06/25 0432)  Intake/Output from previous day: 06/24 0701 - 06/25 0700 In: 1039.7 [P.O.:420; I.V.:519.7; IV Piggyback:100] Out: 2200 [Urine:2200] Intake/Output this shift: No intake/output data recorded.   Physical Exam Vitals reviewed.  Constitutional:      Appearance: Normal appearance.  Cardiovascular:     Rate and Rhythm: Normal rate and regular rhythm.  Pulmonary:     Effort: Pulmonary effort is normal. No respiratory distress.  Abdominal:     Palpations: Abdomen is soft.     Tenderness: There is abdominal tenderness (RUQ).  Musculoskeletal:        General: No swelling or tenderness. Normal range of motion.  Skin:    General: Skin is warm and dry.  Neurological:     General: No focal deficit present.     Mental Status: He is alert and oriented to person, place, and time.  Psychiatric:        Mood and Affect: Mood normal.     Lab Results:  Recent Labs    03/18/20 0428 03/19/20 0606  WBC 5.8 5.4  HGB 10.6* 10.6*  HCT 35.1* 33.6*  PLT 125* 129*   BMET Recent Labs    03/18/20 0428 03/19/20 0606  NA 138 138  K 4.7 4.3  CL 109 106  CO2 18* 21*  GLUCOSE 105* 94  BUN 23 16  CREATININE 1.45* 0.95  CALCIUM 8.6* 8.6*   PT/INR No results for input(s): LABPROT, INR in the last 72 hours. ABG No results for input(s): PHART, HCO3 in the last 72 hours.  Invalid input(s): PCO2, PO2  Studies/Results: ECHOCARDIOGRAM COMPLETE  Result Date:  03/17/2020    ECHOCARDIOGRAM REPORT   Patient Name:   [name] [name] Date of Exam: 03/17/2020 Medical Rec #:  [id number]        Height:       68.0 in Accession #:    [id number]       Weight:       224.7 lb Date of Birth:  01/25/1932        BSA:          2.147 m Patient Age:    21 years         BP:           173/97 mmHg Patient Gender: M                HR:           66 bpm. Exam Location:  Inpatient Procedure: 2D Echo, Cardiac Doppler and Color Doppler Indications:    Elevated Troponin  History:        Patient has no prior history of Echocardiogram examinations.                 Arrythmias:RBBB; Signs/Symptoms:Murmur. Cancer.  Sonographer:    Jonelle Sidle Dance Referring Phys: [id number] [place]  1. Left ventricular ejection fraction, by estimation, is 60 to 65%. The left ventricle has normal function. The left ventricle has no regional wall motion abnormalities. Left ventricular diastolic parameters are consistent with Grade II diastolic dysfunction (pseudonormalization).  2. Right ventricular systolic function is normal. The right ventricular size is normal. There is normal pulmonary artery systolic pressure.  3. The mitral valve is grossly normal. No evidence of mitral valve regurgitation. No evidence of mitral stenosis.  4. The aortic valve is grossly normal. Aortic valve regurgitation is not visualized. No aortic stenosis is present. FINDINGS  Left Ventricle: Left ventricular ejection fraction, by estimation, is 60 to 65%. The left ventricle has normal function. The left ventricle has no regional wall motion abnormalities. The left ventricular internal cavity size was normal in size. There is  no left ventricular hypertrophy. Left ventricular diastolic parameters are consistent with Grade II diastolic dysfunction (pseudonormalization). Right Ventricle: The right ventricular size is normal. No increase in right ventricular wall thickness. Right ventricular systolic function is normal. There is normal  pulmonary artery systolic pressure. The tricuspid regurgitant velocity is 2.26 m/s, and  with an assumed right atrial pressure of 8 mmHg, the estimated right ventricular systolic pressure is 85.4 mmHg. Left Atrium: Left atrial size was normal in size. Right Atrium: Right atrial size was normal in size. Pericardium: There is no evidence of pericardial effusion. Mitral Valve: The mitral valve is grossly normal. No evidence of mitral valve regurgitation. No evidence of mitral valve stenosis. Tricuspid Valve: The tricuspid valve is grossly normal. Tricuspid valve regurgitation is trivial. No evidence of tricuspid stenosis. Aortic Valve: The aortic valve is grossly normal. Aortic valve regurgitation is not visualized. Aortic regurgitation PHT measures 744 msec. No aortic stenosis is present. There is mild calcification of the aortic valve. Aortic valve mean gradient measures 7.0 mmHg. Aortic valve peak gradient measures 14.4 mmHg. Aortic valve area, by VTI measures 2.16 cm. Pulmonic Valve: The pulmonic valve was grossly normal. Pulmonic valve regurgitation is not visualized. No evidence of pulmonic stenosis. Aorta: The aortic root and ascending aorta are structurally normal, with no evidence of dilitation. IAS/Shunts: The atrial septum is grossly normal.  LEFT VENTRICLE PLAX 2D LVIDd:         3.80 cm  Diastology LVIDs:         3.20 cm  LV e' lateral:   7.07 cm/s LV PW:  1.20 cm  LV E/e' lateral: 17.1 LV IVS:        1.10 cm  LV e' medial:    6.42 cm/s LVOT diam:     2.00 cm  LV E/e' medial:  18.8 LV SV:         80 LV SV Index:   37 LVOT Area:     3.14 cm  RIGHT VENTRICLE             IVC RV Basal diam:  2.90 cm     IVC diam: 2.10 cm RV S prime:     12.30 cm/s TAPSE (M-mode): 1.7 cm LEFT ATRIUM             Index       RIGHT ATRIUM           Index LA diam:        4.40 cm 2.05 cm/m  RA Area:     12.80 cm LA Vol (A2C):   77.3 ml 36.00 ml/m RA Volume:   25.30 ml  11.78 ml/m LA Vol (A4C):   38.1 ml 17.74 ml/m LA  Biplane Vol: 56.4 ml 26.26 ml/m  AORTIC VALVE AV Area (Vmax):    1.97 cm AV Area (Vmean):   1.86 cm AV Area (VTI):     2.16 cm AV Vmax:           189.50 cm/s AV Vmean:          122.000 cm/s AV VTI:            0.372 m AV Peak Grad:      14.4 mmHg AV Mean Grad:      7.0 mmHg LVOT Vmax:         119.00 cm/s LVOT Vmean:        72.400 cm/s LVOT VTI:          0.256 m LVOT/AV VTI ratio: 0.69 AI PHT:            744 msec  AORTA Ao Root diam: 3.80 cm Ao Asc diam:  3.40 cm MITRAL VALVE                TRICUSPID VALVE MV Area (PHT): 2.60 cm     TR Peak grad:   20.4 mmHg MV Decel Time: 292 msec     TR Vmax:        226.00 cm/s MV E velocity: 121.00 cm/s MV A velocity: 116.00 cm/s  SHUNTS MV E/A ratio:  1.04         Systemic VTI:  0.26 m                             Systemic Diam: 2.00 cm Mertie Moores MD Electronically signed by Mertie Moores MD Signature Date/Time: 03/17/2020/3:38:44 PM    Final    CT RENAL STONE STUDY  Result Date: 03/18/2020 CLINICAL DATA:  Hydronephrosis on renal ultrasound yesterday. EXAM: CT ABDOMEN AND PELVIS WITHOUT CONTRAST TECHNIQUE: Multidetector CT imaging of the abdomen and pelvis was performed following the standard protocol without IV contrast. COMPARISON:  Renal ultrasound yesterday.  Abdominal CT 09/23/2019 FINDINGS: Lower chest: Right calcified pleural plaques. There are small bilateral pleural effusions, right greater than left. Mitral annulus calcifications, coronary artery calcifications. Normal heart size. Mild distal esophageal wall thickening. Hepatobiliary: No evidence of focal hepatic lesion on noncontrast exam. Gallbladder physiologically distended, no calcified stone. No biliary dilatation. Pancreas: Hypodense lesion in the pancreatic  tail measures approximately 2.7 cm, this is less well-defined currently than on previous in the absence of IV contrast. No ductal dilatation or inflammation. Spleen: Normal in size without focal abnormality. Adrenals/Urinary Tract: No adrenal nodule.  Obstructing 4 mm stone at the right ureterovesicular junction with moderate to severe hydroureteronephrosis. There are additional dependent contiguous stones within the distal right ureter that span 2 cm. Mild right perinephric edema. There are 3 nonobstructing stones in the right kidney measuring 4 and 5 mm. 9 mm hyperdense lesion in the posterior right kidney. Mild left hydroureteronephrosis. This has slightly progressed from prior exam. There is a 6 mm stone in the distal left ureter approximately 9 mm from the bladder insertion, not definitively cause for obstruction of the ureter is dilated distal to this. Mild left perinephric edema. Multiple nonobstructing stones in the left kidney. Urinary bladder is partially distended. No definite bladder wall thickening or bladder stone. Stomach/Bowel: Mild distal colonic diverticulosis without diverticulitis. There is no evidence of bowel obstruction or inflammation. Normal appendix. Stomach is unremarkable. Vascular/Lymphatic: Aorto bi-iliac atherosclerosis. Progressive retroperitoneal adenopathy from prior exam. Left common iliac node currently measures 18 mm, series 3, image 62, previously 15 mm. Left internal iliac nodes are difficult to assess given lack of IV contrast on the current exam but appear progressed. There is new adenopathy in the upper left periaortic station the level of the duodenum with 17 mm lymph node, series 3, image 47. Detailed assessment for adenopathy is inherently limited in the absence of IV contrast. Reproductive: Brachytherapy seeds in the prostate. Other: Bilateral perinephric edema, right greater than left. Inflammatory change about the right pericolic gutter. No ascites or free air. No evidence of intra-abdominal fluid collections. Patchy air in the anterior abdominal wall typical of medication injection site. Musculoskeletal: Right hip arthroplasty. Degenerative change in the left hip. Lytic lesion involving the left pubic body for  example series 6, image 59 and series 3, image 90. Soft tissue component with loss of cortex posteriorly. No evidence of pathologic fracture. No evidence of additional osseous lesion. Remote left posterior rib fractures. Diffuse degenerative change throughout the spine. IMPRESSION: 1. Obstructing 4 mm stone at the right ureterovesicular junction with moderate to severe right hydroureteronephrosis. There are additional dependent contiguous stones within the distal right ureter that span 2 cm. 2. Mild left hydroureteronephrosis has slightly progressed from prior exam. There is a 6 mm stone in the distal left ureter approximately 9 mm from the bladder insertion, not definitively cause for obstruction as the ureter is dilated distal to this. 3. Bilateral nonobstructing renal calculi. 4. Hypodense lesion in the pancreatic tail measuring 2.7 cm, less well-defined currently than on previous exam in the absence of IV contrast. Further evaluation with MRI on an elective basis again recommended. 5. Progressive retroperitoneal adenopathy, suspicious for metastatic disease. Increased size of prior lymph nodes as well as new lymphadenopathy. 6. Lytic lesion involving the left pubic body highly suspicious for metastatic disease. No evidence of pathologic fracture. This may be related to known prostatic malignancy, however is unusual as prostate metastasis are typically sclerotic. 7. Small bilateral pleural effusions, right greater than left. Aortic Atherosclerosis (ICD10-I70.0). Electronically Signed   By: Keith Rake M.D.   On: 03/18/2020 20:40     Assessment and Plan: 1.  Sepsis of urinary origin with a 69m RUVJ stone and obstruction with additional right distal stones and left distal stones with minimal obstruction.  He is responding to abx with reduced fever and stable vitals.  I have made him NPO and Dr. Gloriann Loan will take him to the OR later today to attempt cystoscopy with stent insertion bilaterally, but if that is  not successful, he may need a right perc.  He will need ureteroscopy once the infection has been cleared.   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  He is on trelstar but off of Xtandi and is to f/u with Dr. Alen Blew for consideration of further therapy.   3. Pancreatic mass.        LOS: 3 days    [name] 03/19/2020 [phone] ID: Trish Fountain, male   DOB: 12-22-1931, 86 y.o.   MRN: [id number]

## 2020-03-19 NOTE — Evaluation (Signed)
Physical Therapy Evaluation Patient Details Name: [name] [name] MRN: [id number] DOB: [birth year] Today's Date: 03/19/2020   History of Present Illness  86 yo male admitted to ED on 6/22 with fall with subsequent R forehead and R knee abrasions, UTI sepsis. EKG revealed V1 and V2 abnormalities with elevated troponin, Cardiology state in setting of demand ischemia due to sepsis. PMH includes OA, prostate cancer, asthma, urinary tract obstruction, HTN, OA.  Clinical Impression   Pt presents with LE weakness, R-sided pain secondary to fall, difficulty performing mobility tasks, and decreased activity tolerance. Pt to benefit from acute PT to address deficits. Pt ambulated short hallway distance with use of RW, at baseline pt ambulates with cane and is independent. This day, pt required min-mod assist for all mobility. Pt states he usually does pilate exercises daily, and would like to return to functional independence. PT recommending CIR to address deficits and maximize functional recovery post-hospitalization. PT to progress mobility as tolerated, and will continue to follow acutely.      Follow Up Recommendations CIR    Equipment Recommendations  None recommended by PT    Recommendations for Other Services Rehab consult     Precautions / Restrictions Precautions Precautions: Fall Restrictions Weight Bearing Restrictions: No      Mobility  Bed Mobility Overal bed mobility: Needs Assistance Bed Mobility: Supine to Sit     Supine to sit: Mod assist;HOB elevated     General bed mobility comments: Mod assist for trunk elevation, LE lifting and sequencing to EOB, scooting to EOB with use of bed pads. Pt fearful of sitting up, initially guarding against motion but ended up using bedrails to assist. Verbal cuing for sequencing.  Transfers Overall transfer level: Needs assistance Equipment used: Rolling walker (2 wheeled) Transfers: Sit to/from Stand Sit to Stand: Min assist;From  elevated surface         General transfer comment: Min assist for rise and steady, verbal cuing for hand placement when rising.  Ambulation/Gait Ambulation/Gait assistance: Min assist Gait Distance (Feet): 70 Feet Assistive device: Rolling walker (2 wheeled) Gait Pattern/deviations: Step-through pattern;Decreased stride length;Trunk flexed;Wide base of support Gait velocity: decr   General Gait Details: Min assist to steady, guide pt and RW especially with directional changes. Verbal cuing for upright posture, placement in RW, RW navigation during turning.  Stairs            Wheelchair Mobility    Modified Rankin (Stroke Patients Only)       Balance Overall balance assessment: Needs assistance;History of Falls Sitting-balance support: Bilateral upper extremity supported;Feet supported Sitting balance-Leahy Scale: Fair Sitting balance - Comments: Able to sit EOB without PT support, requires use of UEs for propping   Standing balance support: Bilateral upper extremity supported;During functional activity Standing balance-Leahy Scale: Poor Standing balance comment: reliant on external assist                             Pertinent Vitals/Pain Pain Assessment: Faces Faces Pain Scale: Hurts even more Pain Location: LUE, with mobility and touch Pain Descriptors / Indicators: Sore;Discomfort;Grimacing Pain Intervention(s): Monitored during session;Limited activity within patient's tolerance;Repositioned    Home Living Family/patient expects to be discharged to:: Private residence Living Arrangements: Spouse/significant other Available Help at Discharge: Family;Available 24 hours/day (wife, unable to physically assist very much; son, daughter in law live closeby to assist) Type of Home: House Home Access: Level entry     Home Layout: One level  Home Equipment: [place] - 2 wheels;Cane - single point;Adaptive equipment      Prior Function Level of  Independence: Independent with assistive device(s)         Comments: pt reports walking with cane all of the time, reports independence in ADLs and mobility with use of cane     Hand Dominance   Dominant Hand: Left    Extremity/Trunk Assessment   Upper Extremity Assessment Upper Extremity Assessment: Defer to OT evaluation    Lower Extremity Assessment Lower Extremity Assessment: Generalized weakness;RLE deficits/detail RLE Deficits / Details: R knee abrasion secondary to fall earlier this week, able to perform quad set, ankle pump, and limited ROM heel slide RLE: Unable to fully assess due to pain    Cervical / Trunk Assessment Cervical / Trunk Assessment: Normal  Communication   Communication: HOH  Cognition Arousal/Alertness: Awake/alert Behavior During Therapy: WFL for tasks assessed/performed Overall Cognitive Status: Within Functional Limits for tasks assessed Area of Impairment: Problem solving                             Problem Solving: Slow processing;Decreased initiation;Difficulty sequencing;Requires verbal cues;Requires tactile cues General Comments: Pt A&Ox4, tangential requiring frequent cuing to stay on task. Pt mentioning repeatedly that he has a procedure today, will be transferred to [facility], and cannot eat or drink. Waiting to confirm this with MD, RN aware.      General Comments General comments (skin integrity, edema, etc.): VSS, SpO2 minimum 89% on RA with exertion    Exercises     Assessment/Plan    PT Assessment Patient needs continued PT services  PT Problem List Decreased strength;Decreased mobility;Decreased safety awareness;Decreased activity tolerance;Decreased balance;Decreased knowledge of use of DME;Pain       PT Treatment Interventions DME instruction;Therapeutic activities;Gait training;Therapeutic exercise;Patient/family education;Balance training;Stair training;Functional mobility training;Neuromuscular re-education     PT Goals (Current goals can be found in the Care Plan section)  Acute Rehab PT Goals Patient Stated Goal: get stronger PT Goal Formulation: With patient Time For Goal Achievement: 04/02/20 Potential to Achieve Goals: Good    Frequency Min 3X/week   Barriers to discharge        Co-evaluation               AM-PAC PT "6 Clicks" Mobility  Outcome Measure Help needed turning from your back to your side while in a flat bed without using bedrails?: A Little Help needed moving from lying on your back to sitting on the side of a flat bed without using bedrails?: A Lot Help needed moving to and from a bed to a chair (including a wheelchair)?: A Lot Help needed standing up from a chair using your arms (e.g., wheelchair or bedside chair)?: A Little Help needed to walk in hospital room?: A Little Help needed climbing 3-5 steps with a railing? : A Lot 6 Click Score: 15    End of Session Equipment Utilized During Treatment: Gait belt Activity Tolerance: Patient tolerated treatment well;Patient limited by fatigue Patient left: in chair;with chair alarm set;with call bell/phone within reach;with family/visitor present Nurse Communication: Mobility status PT Visit Diagnosis: Other abnormalities of gait and mobility (R26.89);Muscle weakness (generalized) (M62.81);Difficulty in walking, not elsewhere classified (R26.2)    Time: 6734-1937 PT Time Calculation (min) (ACUTE ONLY): 37 min   Charges:   PT Evaluation $PT Eval Low Complexity: 1 Low PT Treatments $Gait Training: 8-22 mins      Nyaja [place] E, PT  Acute Rehabilitation Services Pager [phone]  Office [phone]  Roxine Caddy D Elonda Husky 03/19/2020, 11:58 AM

## 2020-03-19 NOTE — Progress Notes (Signed)
PROGRESS NOTE    [name] [name]  JKD:[id number] DOB: 1932/07/05 DOA: 03/16/2020 PCP: [name] Dus, MD     Brief Narrative:  86 y.o. WM (former [employer]) PMHx HTN, RBBB, prostate CA. nephrolithiasis, Hx urinary tract obstruction, hypothyroidism, CKD stage IIIb  Pt presents to ED with c/o generalized weakness.  Patient states that he was transferring from his chair and felt weak and fell face forward. He states that he is wife called immediately. EMS noticed that he was on the floor and was having some chest pain. His EKG in the field showed STEMI in V1 and V2. Patient states that he has not been on it for that long. He states that he was given aspirin 324 mg by EMS. He has no chest pain currently. Patient is not on any blood thinners.   ED Course: Cards saw pt: not a STEMI.  Found to have Tm 101.  Found to have UTI.  WBC 11.6.  Lactate 3.3.  Creat 1.6, slightly up from baseline of 1.35 earlier this year.  Trops were 43 and 124.  Cards feels that this is demand ischemia, pt remains CP free.    Subjective: 6/25 afebrile overnight, A/O x4, negative CP, negative S OB, negative N/V.  Positive chronic back pain and rib pain.   Assessment & Plan: Covid vaccination; positive vaccination     Principal Problem:   Sepsis secondary to UTI [facility]) Active Problems:   Prostate cancer ([name] [name])   Demand ischemia ([name] [name])   CKD (chronic kidney disease), stage IIIb   Hydronephrosis of right kidney   Sepsis UTI/severe sepsis/ positive Staph Simulans -On admission met criteria for sepsis temp> 38 C, HR> 90 bpm, RR> 20; lactic acid> 2 -Elevated procalcitonin 1.71, will complete 5-day course antibiotics. -6/24 urine cultures positive for Staph Simulans -Blood culture still NGTD -6/24 decrease Normal Saline 50 ml/hr -Continue current antibiotic until susceptibilities result.  Spoke with [name] ID and she states if pansensitive can change over to amoxicillin or Keflex to complete  treatment.  Increasing PSA/RIGHT & LEFT Hydronephrosis with nephrolithiasis -Received a call from [name], who has treated patient in the past for his prostate cancer and urinary tract obstruction.  Concerned that with an increasing PSA and lymphadenopathy (unable to see in epic), that patient may require cystoscopy and stent or percutaneous drain.   -Have ordered CT abdomen pelvis W0 contrast stone protocol as requested. -Dr. Jeffie [name] will see patient in a.m. await recommendations -6/25 s/p of bilateral ureteral stent see procedures below -Per urology As long as he is doing well tomorrow, can remove Foley catheter.  He will eventually need bilateral ureteroscopy with laser lithotripsy and ureteral stent placement/exchange  Lytic lesion LEFT pubic body -6/25 discussed case with Dr. Zola [name] oncology who is aware of findings, and attributes them to worsening prostate cancer.  Will address as outpatient.  Not a candidate for aggressive care.  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 Range 03/16/2020 18:36 03/16/2020 20:49 03/17/2020 01:27 03/17/2020 02:53  Troponin I (High Sensitivity) Latest Ref Range: <18 ng/L 43 (H) 124 (HH) 152 (HH) 143 (HH)    Prostate cancer -Currently not on any treatment.  CKD stage IIIb (baseline  Cr 1.35) Recent Labs  Lab 03/16/20 1836 03/16/20 1859 03/17/20 0253 03/18/20 0428 03/19/20 0606  CREATININE 1.60* 1.50* 1.32* 1.45* 0.95  -Slightly above baseline -Avoid all nephrotoxic medication    Goals of care -6/24 PT/OT consult; demand ischemia/chronic diastolic CHF evaluate patient for CIR vs SNF    DVT prophylaxis: Lovenox Code Status:  Full Family Communication: 6/25 wife and son at bedside for discussion of plan of care Status is: Inpatient    Dispo: The patient is from: Home              Anticipated d/c is to: Home              Anticipated d/c date is: 6/28              Patient currently unstable      Consultants:  Cardiology Phone consult Dr. Carlyle [name] ID [name] Phone consult Dr. Zola [name] oncology    Procedures/Significant Events:  6/22 Renal Ultrasound;-Increased renal cortical echogenicity compatible with medical renal disease. -Moderate right hydronephrosis without visible shadowing calculus. Distal calculus is not excluded. 6/23 echocardiogram; Left Ventricle: LVEF= 60 to 65%.  -Grade II diastolic dysfunction (pseudonormalization).  6/24 CT renal stone study;-obstructing 4 mm stone at the RIGHT ureterovesicular junction with moderate to severe right hydroureteronephrosis. There are additional dependent contiguous stones within the distal right ureter that span 2 cm. -Mild LEFT hydroureteronephrosis has slightly progressed from prior exam. There is a 6 mm stone in the distal left ureter approximately 9 mm from the bladder insertion, not definitively cause for obstruction as the ureter is dilated distal to this. -Bilateral nonobstructing renal calculi. -Hypodense lesion in the pancreatic tail measuring 2.7 cm, less well-defined currently than on previous exam in the absence of IV contrast. Further evaluation with MRI on an elective basis again recommended. -Progressive retroperitoneal adenopathy, suspicious for metastatic disease. Increased size of prior lymph nodes as well as new lymphadenopathy. -Lytic lesion involving the left pubic body highly suspicious for metastatic disease. No evidence of pathologic fracture. This may be related to known prostatic malignancy, however is unusual as prostate metastasis are typically sclerotic. -Small bilateral pleural effusions, right  greater than left. 6/25 Bilateral 6 x 26 double-J ureteral stent placed     I have personally reviewed and interpreted all radiology studies and my findings are as above.  VENTILATOR SETTINGS:    Cultures 6/22 SARS coronavirus negative 6/22 urine positive Staph Simulans 6/22 blood NGTD 6/22 blood RIGHT AC NGTD    Antimicrobials: Anti-infectives (From admission, onward)   Start     Dose/Rate Stop   03/17/20 0600  cefTRIAXone (ROCEPHIN) 1 g in sodium chloride 0.9 % 100 mL IVPB     Discontinue     1 g 200 mL/hr over 30 Minutes     03/16/20 2300  ceFEPIme (MAXIPIME) 1 g in sodium chloride 0.9 % 100 mL IVPB        1 g 200 mL/hr over 30 Minutes 03/17/20 0007   03/16/20 2230  vancomycin (VANCOCIN) IVPB 1000 mg/200 mL premix  Status:  Discontinued        1,000 mg 200 mL/hr over 60 Minutes 03/16/20 2349       Devices    LINES / TUBES:  18 French Foley catheter 6/25>> Bilateral 6 x 26 double-J ureteral stent placed 6/25>>   Continuous Infusions: . sodium chloride 1,000 mL (03/19/20 0518)  . sodium chloride 50 mL/hr at 03/19/20 0300  . cefTRIAXone (ROCEPHIN)  IV  1 g (03/19/20 0520)     Objective: Vitals:   03/18/20 1700 03/18/20 2039 03/19/20 0416 03/19/20 0432  BP: (!) 145/69 (!) 152/79 (!) 159/88   Pulse: 76 74 68   Resp: _0 Temp: 98.2 F (36.8 C) 99.1 F (37.3 C) 99.4 F (37.4 C)   TempSrc: Oral Oral Oral   SpO2: 100% 96% 97%   Weight:    98.7 kg  Height:        Intake/Output Summary (Last 24 hours) at 03/19/2020 0740 Last data filed at 03/19/2020 2951 Gross per 24 hour  Intake 1039.73 ml  Output 2200 ml  Net -1160.27 ml   Filed Weights   03/17/20 2133 03/18/20 0500 03/19/20 0432  Weight: 101.2 kg 101.2 kg 98.7 kg    Physical Exam:  General: A/O x4, no acute respiratory distress Eyes: negative scleral hemorrhage, negative anisocoria, negative icterus ENT: Negative Runny nose, negative gingival bleeding, Neck:  Negative scars, masses,  torticollis, lymphadenopathy, JVD Lungs: Clear to auscultation bilaterally without wheezes or crackles Cardiovascular: Regular rate and rhythm without murmur gallop or rub normal S1 and S2 Abdomen: MORBIDLY OBESE, negative abdominal pain, nondistended, positive soft, bowel sounds, no rebound, no ascites, no appreciable mass Extremities: No significant cyanosis, clubbing, or edema bilateral lower extremities Skin: Negative rashes, lesions, ulcers Psychiatric:  Negative depression, negative anxiety, negative fatigue, negative mania  Central nervous system:  Cranial nerves II through XII intact, tongue/uvula midline, all extremities muscle strength 5/5, sensation intact throughout, negative dysarthria, negative expressive aphasia, negative receptive aphasia.  .     Data Reviewed: Care during the described time interval was provided by me .  I have reviewed this patient's available data, including medical history, events of note, physical examination, and all test results as part of my evaluation.  CBC: Recent Labs  Lab 03/16/20 1836 03/16/20 1859 03/17/20 0253 03/18/20 0428 03/19/20 0606  WBC 11.6*  --  8.4 5.8 5.4  NEUTROABS 10.4*  --   --  4.2 3.9  HGB 11.3* 11.2* 9.9* 10.6* 10.6*  HCT 35.9* 33.0* 31.1* 35.1* 33.6*  MCV 100.3*  --  101.0* 105.4* 101.2*  PLT 167  --  144* 125* 884*   Basic Metabolic Panel: Recent Labs  Lab 03/16/20 1836 03/16/20 1859 03/17/20 0253 03/18/20 0428 03/19/20 0606  NA 135 140 140 138 138  K 4.3 4.2 4.7 4.7 4.3  CL 106 107 111 109 106  CO2 18*  --  22 18* 21*  GLUCOSE 174* 174* 120* 105* 94  BUN 26* 28* 25* 23 16  CREATININE 1.60* 1.50* 1.32* 1.45* 0.95  CALCIUM 9.0  --  8.6* 8.6* 8.6*  MG  --   --   --  1.9 1.9  PHOS  --   --   --  3.2 2.9   GFR: Estimated Creatinine Clearance: 63.6 mL/min (by C-G formula based on SCr of 0.95 mg/dL). Liver Function Tests: Recent Labs  Lab 03/16/20 1836 03/18/20 0428 03/19/20 0606  AST 28 28 14*  ALT  _1 ALKPHOS 66 63 56  BILITOT 0.6 0.9 0.8  PROT 6.7 6.0* 5.6*  ALBUMIN 3.8 3.1* 2.8*   No results for input(s): LIPASE, AMYLASE in the last 168 hours. No results for input(s): AMMONIA in the last 168 hours. Coagulation Profile: No results for input(s): INR, PROTIME in the last 168 hours. Cardiac Enzymes: Recent Labs  Lab 03/16/20 1836  CKTOTAL 94   BNP (last 3 results) No results for  input(s): PROBNP in the last 8760 hours. HbA1C: No results for input(s): HGBA1C in the last 72 hours. CBG: No results for input(s): GLUCAP in the last 168 hours. Lipid Profile: No results for input(s): CHOL, HDL, LDLCALC, TRIG, CHOLHDL, LDLDIRECT in the last 72 hours. Thyroid Function Tests: No results for input(s): TSH, T4TOTAL, FREET4, T3FREE, THYROIDAB in the last 72 hours. Anemia Panel: No results for input(s): VITAMINB12, FOLATE, FERRITIN, TIBC, IRON, RETICCTPCT in the last 72 hours. Sepsis Labs: Recent Labs  Lab 03/16/20 1855 03/16/20 2049 03/17/20 0835 03/17/20 1059 03/18/20 0428  PROCALCITON  --   --  1.71  --  1.06  LATICACIDVEN 3.3* 2.3* 1.1 1.9  --     Recent Results (from the past 240 hour(s))  SARS Coronavirus 2 by RT PCR (hospital order, performed in [facility] hospital lab) Nasopharyngeal Nasopharyngeal Swab     Status: None   Collection Time: 03/16/20  6:38 PM   Specimen: Nasopharyngeal Swab  Result Value Ref Range Status   SARS Coronavirus 2 NEGATIVE NEGATIVE Final    Comment: (NOTE) SARS-CoV-2 target nucleic acids are NOT DETECTED.  The SARS-CoV-2 RNA is generally detectable in upper and lower respiratory specimens during the acute phase of infection. The lowest concentration of SARS-CoV-2 viral copies this assay can detect is 250 copies / mL. A negative result does not preclude SARS-CoV-2 infection and should not be used as the sole basis for treatment or other patient management decisions.  A negative result may occur with improper specimen collection /  handling, submission of specimen other than nasopharyngeal swab, presence of viral mutation(s) within the areas targeted by this assay, and inadequate number of viral copies (<250 copies / mL). A negative result must be combined with clinical observations, patient history, and epidemiological information.  Fact Sheet for Patients:   StrictlyIdeas.no  Fact Sheet for Healthcare Providers: BankingDealers.co.za  This test is not yet approved or  cleared by the [place] FDA and has been authorized for detection and/or diagnosis of SARS-CoV-2 by FDA under an Emergency Use Authorization (EUA).  This EUA will remain in effect (meaning this test can be used) for the duration of the COVID-19 declaration under Section 564(b)(1) of the Act, 21 U.S.C. section 360bbb-3(b)(1), unless the authorization is terminated or revoked sooner.  Performed at [facility], [place] [address] [place], [place] [address]   Blood culture (routine x 2)     Status: None (Preliminary result)   Collection Time: 03/16/20  6:55 PM   Specimen: BLOOD  Result Value Ref Range Status   Specimen Description BLOOD SITE NOT SPECIFIED  Final   Special Requests   Final    BOTTLES DRAWN AEROBIC AND ANAEROBIC Blood Culture adequate volume   Culture   Final    NO GROWTH 2 DAYS Performed at [facility], [address] [place] [address], [place], [place] [address]    Report Status PENDING  Incomplete  Blood culture (routine x 2)     Status: None (Preliminary result)   Collection Time: 03/16/20  7:03 PM   Specimen: BLOOD  Result Value Ref Range Status   Specimen Description BLOOD RIGHT ANTECUBITAL  Final   Special Requests   Final    BOTTLES DRAWN AEROBIC AND ANAEROBIC Blood Culture adequate volume   Culture   Final    NO GROWTH 2 DAYS Performed at [facility], [place] [address], [place], [place] [address]    Report Status PENDING  Incomplete  Culture, Urine  Status:  Abnormal (Preliminary result)   Collection Time: 03/16/20 10:52 PM   Specimen: Urine, Random  Result Value Ref Range Status   Specimen Description URINE, RANDOM  Final   Special Requests NONE  Final   Culture (A)  Final    >=100,000 COLONIES/mL STAPHYLOCOCCUS SIMULANS SUSCEPTIBILITIES TO FOLLOW Performed at [facility], [address] [place], [place] [address]    Report Status PENDING  Incomplete         Radiology Studies: ECHOCARDIOGRAM COMPLETE  Result Date: 03/17/2020    ECHOCARDIOGRAM REPORT   Patient Name:   ARLON BLEIER Date of Exam: 03/17/2020 Medical Rec #:  [id number]        Height:       68.0 in Accession #:    [id number]       Weight:       224.7 lb Date of Birth:  05-22-1932        BSA:          2.147 m Patient Age:    61 years         BP:           173/97 mmHg Patient Gender: M                HR:           66 bpm. Exam Location:  Inpatient Procedure: 2D Echo, Cardiac Doppler and Color Doppler Indications:    Elevated Troponin  History:        Patient has no prior history of Echocardiogram examinations.                 Arrythmias:RBBB; Signs/Symptoms:Murmur. Cancer.  Sonographer:    Jonelle Sidle Dance Referring Phys: [id number] Virgil  1. Left ventricular ejection fraction, by estimation, is 60 to 65%. The left ventricle has normal function. The left ventricle has no regional wall motion abnormalities. Left ventricular diastolic parameters are consistent with Grade II diastolic dysfunction (pseudonormalization).  2. Right ventricular systolic function is normal. The right ventricular size is normal. There is normal pulmonary artery systolic pressure.  3. The mitral valve is grossly normal. No evidence of mitral valve regurgitation. No evidence of mitral stenosis.  4. The aortic valve is grossly normal. Aortic valve regurgitation is not visualized. No aortic stenosis is present. FINDINGS  Left Ventricle: Left ventricular ejection fraction, by estimation, is 60 to  65%. The left ventricle has normal function. The left ventricle has no regional wall motion abnormalities. The left ventricular internal cavity size was normal in size. There is  no left ventricular hypertrophy. Left ventricular diastolic parameters are consistent with Grade II diastolic dysfunction (pseudonormalization). Right Ventricle: The right ventricular size is normal. No increase in right ventricular wall thickness. Right ventricular systolic function is normal. There is normal pulmonary artery systolic pressure. The tricuspid regurgitant velocity is 2.26 m/s, and  with an assumed right atrial pressure of 8 mmHg, the estimated right ventricular systolic pressure is 45.3 mmHg. Left Atrium: Left atrial size was normal in size. Right Atrium: Right atrial size was normal in size. Pericardium: There is no evidence of pericardial effusion. Mitral Valve: The mitral valve is grossly normal. No evidence of mitral valve regurgitation. No evidence of mitral valve stenosis. Tricuspid Valve: The tricuspid valve is grossly normal. Tricuspid valve regurgitation is trivial. No evidence of tricuspid stenosis. Aortic Valve: The aortic valve is grossly normal. Aortic valve regurgitation is not visualized. Aortic regurgitation PHT measures 744 msec. No aortic stenosis  is present. There is mild calcification of the aortic valve. Aortic valve mean gradient measures 7.0 mmHg. Aortic valve peak gradient measures 14.4 mmHg. Aortic valve area, by VTI measures 2.16 cm. Pulmonic Valve: The pulmonic valve was grossly normal. Pulmonic valve regurgitation is not visualized. No evidence of pulmonic stenosis. Aorta: The aortic root and ascending aorta are structurally normal, with no evidence of dilitation. IAS/Shunts: The atrial septum is grossly normal.  LEFT VENTRICLE PLAX 2D LVIDd:         3.80 cm  Diastology LVIDs:         3.20 cm  LV e' lateral:   7.07 cm/s LV PW:         1.20 cm  LV E/e' lateral: 17.1 LV IVS:        1.10 cm  LV e'  medial:    6.42 cm/s LVOT diam:     2.00 cm  LV E/e' medial:  18.8 LV SV:         80 LV SV Index:   37 LVOT Area:     3.14 cm  RIGHT VENTRICLE             IVC RV Basal diam:  2.90 cm     IVC diam: 2.10 cm RV S prime:     12.30 cm/s TAPSE (M-mode): 1.7 cm LEFT ATRIUM             Index       RIGHT ATRIUM           Index LA diam:        4.40 cm 2.05 cm/m  RA Area:     12.80 cm LA Vol (A2C):   77.3 ml 36.00 ml/m RA Volume:   25.30 ml  11.78 ml/m LA Vol (A4C):   38.1 ml 17.74 ml/m LA Biplane Vol: 56.4 ml 26.26 ml/m  AORTIC VALVE AV Area (Vmax):    1.97 cm AV Area (Vmean):   1.86 cm AV Area (VTI):     2.16 cm AV Vmax:           189.50 cm/s AV Vmean:          122.000 cm/s AV VTI:            0.372 m AV Peak Grad:      14.4 mmHg AV Mean Grad:      7.0 mmHg LVOT Vmax:         119.00 cm/s LVOT Vmean:        72.400 cm/s LVOT VTI:          0.256 m LVOT/AV VTI ratio: 0.69 AI PHT:            744 msec  AORTA Ao Root diam: 3.80 cm Ao Asc diam:  3.40 cm MITRAL VALVE                TRICUSPID VALVE MV Area (PHT): 2.60 cm     TR Peak grad:   20.4 mmHg MV Decel Time: 292 msec     TR Vmax:        226.00 cm/s MV E velocity: 121.00 cm/s MV A velocity: 116.00 cm/s  SHUNTS MV E/A ratio:  1.04         Systemic VTI:  0.26 m                             Systemic Diam: 2.00 cm Mertie Moores MD Electronically signed by  Mertie Moores MD Signature Date/Time: 03/17/2020/3:38:44 PM    Final    CT RENAL STONE STUDY  Result Date: 03/18/2020 CLINICAL DATA:  Hydronephrosis on renal ultrasound yesterday. EXAM: CT ABDOMEN AND PELVIS WITHOUT CONTRAST TECHNIQUE: Multidetector CT imaging of the abdomen and pelvis was performed following the standard protocol without IV contrast. COMPARISON:  Renal ultrasound yesterday.  Abdominal CT 09/23/2019 FINDINGS: Lower chest: Right calcified pleural plaques. There are small bilateral pleural effusions, right greater than left. Mitral annulus calcifications, coronary artery calcifications. Normal heart size.  Mild distal esophageal wall thickening. Hepatobiliary: No evidence of focal hepatic lesion on noncontrast exam. Gallbladder physiologically distended, no calcified stone. No biliary dilatation. Pancreas: Hypodense lesion in the pancreatic tail measures approximately 2.7 cm, this is less well-defined currently than on previous in the absence of IV contrast. No ductal dilatation or inflammation. Spleen: Normal in size without focal abnormality. Adrenals/Urinary Tract: No adrenal nodule. Obstructing 4 mm stone at the right ureterovesicular junction with moderate to severe hydroureteronephrosis. There are additional dependent contiguous stones within the distal right ureter that span 2 cm. Mild right perinephric edema. There are 3 nonobstructing stones in the right kidney measuring 4 and 5 mm. 9 mm hyperdense lesion in the posterior right kidney. Mild left hydroureteronephrosis. This has slightly progressed from prior exam. There is a 6 mm stone in the distal left ureter approximately 9 mm from the bladder insertion, not definitively cause for obstruction of the ureter is dilated distal to this. Mild left perinephric edema. Multiple nonobstructing stones in the left kidney. Urinary bladder is partially distended. No definite bladder wall thickening or bladder stone. Stomach/Bowel: Mild distal colonic diverticulosis without diverticulitis. There is no evidence of bowel obstruction or inflammation. Normal appendix. Stomach is unremarkable. Vascular/Lymphatic: Aorto bi-iliac atherosclerosis. Progressive retroperitoneal adenopathy from prior exam. Left common iliac node currently measures 18 mm, series 3, image 62, previously 15 mm. Left internal iliac nodes are difficult to assess given lack of IV contrast on the current exam but appear progressed. There is new adenopathy in the upper left periaortic station the level of the duodenum with 17 mm lymph node, series 3, image 47. Detailed assessment for adenopathy is  inherently limited in the absence of IV contrast. Reproductive: Brachytherapy seeds in the prostate. Other: Bilateral perinephric edema, right greater than left. Inflammatory change about the right pericolic gutter. No ascites or free air. No evidence of intra-abdominal fluid collections. Patchy air in the anterior abdominal wall typical of medication injection site. Musculoskeletal: Right hip arthroplasty. Degenerative change in the left hip. Lytic lesion involving the left pubic body for example series 6, image 59 and series 3, image 90. Soft tissue component with loss of cortex posteriorly. No evidence of pathologic fracture. No evidence of additional osseous lesion. Remote left posterior rib fractures. Diffuse degenerative change throughout the spine. IMPRESSION: 1. Obstructing 4 mm stone at the right ureterovesicular junction with moderate to severe right hydroureteronephrosis. There are additional dependent contiguous stones within the distal right ureter that span 2 cm. 2. Mild left hydroureteronephrosis has slightly progressed from prior exam. There is a 6 mm stone in the distal left ureter approximately 9 mm from the bladder insertion, not definitively cause for obstruction as the ureter is dilated distal to this. 3. Bilateral nonobstructing renal calculi. 4. Hypodense lesion in the pancreatic tail measuring 2.7 cm, less well-defined currently than on previous exam in the absence of IV contrast. Further evaluation with MRI on an elective basis again recommended. 5. Progressive retroperitoneal adenopathy, suspicious  for metastatic disease. Increased size of prior lymph nodes as well as new lymphadenopathy. 6. Lytic lesion involving the left pubic body highly suspicious for metastatic disease. No evidence of pathologic fracture. This may be related to known prostatic malignancy, however is unusual as prostate metastasis are typically sclerotic. 7. Small bilateral pleural effusions, right greater than left.  Aortic Atherosclerosis (ICD10-I70.0). Electronically Signed   By: Keith Rake M.D.   On: 03/18/2020 20:40        Scheduled Meds: . enoxaparin (LOVENOX) injection  40 mg Subcutaneous QHS  . levothyroxine  150 mcg Oral QAC breakfast  . loratadine  10 mg Oral Daily  . meloxicam  15 mg Oral Daily  . metoprolol tartrate  12.5 mg Oral BID  . mirabegron ER  50 mg Oral Daily  . multivitamin with minerals  1 tablet Oral Once per day on Mon Thu   Continuous Infusions: . sodium chloride 1,000 mL (03/19/20 0518)  . sodium chloride 50 mL/hr at 03/19/20 0300  . cefTRIAXone (ROCEPHIN)  IV 1 g (03/19/20 0520)     LOS: 3 days    Time spent:40 min    Jalexa Pifer, Geraldo Docker, MD [facility] Pager [phone]  If 7PM-7AM, please contact night-coverage [email or web address] Password [facility] 03/19/2020, 7:40 AM

## 2020-03-19 NOTE — Anesthesia Procedure Notes (Signed)
Procedure Name: LMA Insertion Performed by: Zalmen Wrightsman H, CRNA Pre-anesthesia Checklist: Patient identified, Emergency Drugs available, Suction available and Patient being monitored Patient Re-evaluated:Patient Re-evaluated prior to induction Oxygen Delivery Method: Circle System Utilized Preoxygenation: Pre-oxygenation with 100% oxygen Induction Type: IV induction Ventilation: Mask ventilation without difficulty LMA: LMA inserted LMA Size: 5.0 Number of attempts: 1 Airway Equipment and Method: Bite block Placement Confirmation: positive ETCO2 Tube secured with: Tape Dental Injury: Teeth and Oropharynx as per pre-operative assessment        

## 2020-03-19 NOTE — Progress Notes (Signed)
Pre-checklist completed by nurse. Pt was transported in bed at room air to procedure area. Pt alert and oriented.  Report given to Ginger RN.  There is no consent orders in place for procedure.

## 2020-03-19 NOTE — Op Note (Signed)
Operative Note  Preoperative diagnosis:  1.  Bilateral ureteral calculi, UTI  Postoperative diagnosis: 1.  Bilateral ureteral calculi, UTI  Procedure(s): 1.  Cystoscopy with bilateral retrograde pyelogram, bilateral ureteral stent placement  Surgeon: Link Snuffer, MD  Assistants: None  Anesthesia: General  Complications: None immediate  EBL: Minimal  Specimens: 1.  None  Drains/Catheters: 1.  Bilateral 6 x 26 double-J ureteral stent 2.  18 French Foley catheter  Intraoperative findings: 1.  Normal anterior urethra 2.  Evidence of prior transurethral resection of the prostate.  The left ureteral orifice was embedded within the tissue just proximal to the prostate.  I was able to intubate it and perform a retrograde pyelogram and a stent placement. 3.  Right ureteral orifice was a bit more difficult to find.  There was evidence of some nodular growth possibly related to advanced prostate cancer.  I was however able to intubate the ureteral orifice and perform retrograde pyelogram and a stent placement  Indication: 86 year old male currently at admitted for UTI.  He had a renal ultrasound on 6/23 that revealed right-sided hydronephrosis.  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  CT scan was performed that showed left distal ureteral stone with mild hydronephrosis as well as a right ureteral calculus with severe right-sided hydronephrosis.  He presents for bilateral ureteral stent placement.  Description of procedure:  The patient was identified and consent was obtained.  The patient was taken to the operating room and placed in the supine position.  The patient was placed under general anesthesia.  Perioperative antibiotics were administered.  The patient was placed in dorsal lithotomy.  Patient was prepped and draped in a standard sterile fashion and a timeout was performed.  A 21 French rigid cystoscope was advanced into the  urethra and into the bladder.  Complete cystoscopy was performed with findings noted above.  I intubated the left ureteral orifice with a sensor wire and advanced that up into the ureter.  I advanced an open-ended ureteral catheter over this into the distal ureter and remove the wire.  I shot a retrograde pyelogram that showed mild left-sided hydroureteronephrosis.  I then advanced a wire up the ureter and into the kidney under fluoroscopic guidance.  I then placed a 6 x 26 double-J ureteral stent in a standard fashion followed by removal of the wire.  Fluoroscopy confirmed proximal placement and direct visualization confirmed a good coil within the bladder.  I turned my attention to the right ureteral orifice.  It was a bit more elevated and embedded in some nodular tissue possibly consistent with some advanced prostate cancer growth.  I was able to intubate the right ureteral orifice with a sensor wire and advanced an open-ended ureteral catheter into the distal ureter.  Retrograde pyelogram was performed that showed severe hydroureteronephrosis.  I then advanced the wire up the right ureter and into the kidney under fluoroscopic guidance followed by routine placement of a 6 x 26 double-J ureteral stent.  Fluoroscopy confirmed proximal placement and direct visualization confirmed a good coil in the bladder.  I withdrew the scope and placed an 81 French Foley catheter.  This include the operation.  Patient tolerated the procedure well and was stable postoperative.  Plan: As long as he is doing well tomorrow, can remove Foley catheter.  He will eventually need bilateral ureteroscopy with laser lithotripsy and ureteral stent placement/exchange

## 2020-03-19 NOTE — Progress Notes (Signed)
Occupational Therapy Evaluation Patient Details Name: [name] [name] MRN: [id number] DOB: 01-31-1932 Today's Date: 03/19/2020    History of Present Illness 86 yo male admitted to ED on 6/22 with fall with subsequent R forehead and R knee abrasions, UTI sepsis. EKG revealed V1 and V2 abnormalities with elevated troponin, Cardiology state in setting of demand ischemia due to sepsis. PMH includes OA, prostate cancer, asthma, urinary tract obstruction, HTN, OA.   Clinical Impression   Patient lives at home with wife when he is typically independent with ADLs and uses a cane for mobility.  Today patient presenting with weakness, decreased balance and activity tolerance.  Required min guard assist for UB ALDs and min assist with LB.  Able to walk with RW and min guard, but did require mod assist to stand off regular height chair.  Recommending CIR as patient is motivated and demonstrates good potential.  He would benefit from increased therapy to improve deficits listed below and increase independence level.  Will continue to follow with OT acutely to address the deficits listed below.      Follow Up Recommendations  CIR    Equipment Recommendations  Other (comment) (defer to next venue)    Recommendations for Other Services       Precautions / Restrictions Precautions Precautions: Fall Restrictions Weight Bearing Restrictions: No      Mobility Bed Mobility Overal bed mobility: Needs Assistance Bed Mobility: Sit to Supine     Supine to sit: Mod assist;HOB elevated Sit to supine: Mod assist;HOB elevated   General bed mobility comments: Help with getting legs into bed and straightening trunk in bed  Transfers Overall transfer level: Needs assistance Equipment used: Rolling walker (2 wheeled) Transfers: Sit to/from Stand Sit to Stand: Mod assist         General transfer comment: Min assist for rise and steady, verbal cuing for hand placement when rising.    Balance  Overall balance assessment: Needs assistance;History of Falls Sitting-balance support: Bilateral upper extremity supported;Feet supported Sitting balance-Leahy Scale: Fair Sitting balance - Comments: Able to sit EOB without PT support, requires use of UEs for propping   Standing balance support: Bilateral upper extremity supported;During functional activity Standing balance-Leahy Scale: Poor Standing balance comment: reliant on external assist                           ADL either performed or assessed with clinical judgement   ADL Overall ADL's : Needs assistance/impaired Eating/Feeding: Independent;Sitting   Grooming: Min guard;Standing   Upper Body Bathing: Min guard;Sitting   Lower Body Bathing: Moderate assistance;Sit to/from stand   Upper Body Dressing : Min guard;Sitting   Lower Body Dressing: Moderate assistance;Sit to/from stand   Toilet Transfer: Moderate assistance;RW;Regular Toilet           Functional mobility during ADLs: Min guard;Moderate assistance;Rolling walker       Vision         Perception     Praxis      Pertinent Vitals/Pain Pain Assessment: No/denies pain Faces Pain Scale: Hurts even more Pain Location: LUE, with mobility and touch Pain Descriptors / Indicators: Sore;Discomfort;Grimacing Pain Intervention(s): Limited activity within patient's tolerance;Monitored during session;Repositioned     Hand Dominance Left   Extremity/Trunk Assessment Upper Extremity Assessment Upper Extremity Assessment: Generalized weakness   Lower Extremity Assessment Lower Extremity Assessment: Defer to PT evaluation RLE Deficits / Details: R knee abrasion secondary to fall earlier this week, able to  perform quad set, ankle pump, and limited ROM heel slide RLE: Unable to fully assess due to pain   Cervical / Trunk Assessment Cervical / Trunk Assessment: Normal   Communication Communication Communication: HOH   Cognition  Arousal/Alertness: Awake/alert Behavior During Therapy: WFL for tasks assessed/performed Overall Cognitive Status: Within Functional Limits for tasks assessed Area of Impairment: Problem solving;Following commands                       Following Commands: Follows multi-step commands inconsistently     Problem Solving: Slow processing;Decreased initiation;Difficulty sequencing;Requires verbal cues General Comments: Pt A&Ox4, tangential requiring frequent cuing to stay on task. Pt mentioning repeatedly that he has a procedure today, will be transferred to [facility], and cannot eat or drink. Waiting to confirm this with MD, RN aware.   General Comments  VSS, SpO2 minimum 89% on RA with exertion    Exercises     Shoulder Instructions      Home Living Family/patient expects to be discharged to:: Private residence Living Arrangements: Spouse/significant other Available Help at Discharge: Family (wife, unable to physically assist very much; son, daughter i) Type of Home: House Home Access: Level entry     Home Layout: One level Alternate Level Stairs-Number of Steps: with 2 + 2 steps inside home from den Alternate Level Stairs-Rails: None Bathroom Shower/Tub: Teacher, early years/pre: Standard     Home Equipment: Environmental consultant - 2 wheels;Cane - single point;Adaptive equipment Adaptive Equipment: Sock aid;Reacher        Prior Functioning/Environment Level of Independence: Independent with assistive device(s)        Comments: pt reports walking with cane all of the time, reports independence in ADLs and mobility with use of cane        OT Problem List: Decreased strength;Decreased activity tolerance;Impaired balance (sitting and/or standing)      OT Treatment/Interventions: Self-care/ADL training;Therapeutic exercise;Therapeutic activities;Balance training;Patient/family education;Energy conservation    OT Goals(Current goals can be found in the care plan section)  Acute Rehab OT Goals Patient Stated Goal: get stronger OT Goal Formulation: With patient Time For Goal Achievement: 04/02/20 Potential to Achieve Goals: Good  OT Frequency: Min 2X/week   Barriers to D/C:            Co-evaluation              AM-PAC OT "6 Clicks" Daily Activity     Outcome Measure Help from another person eating meals?: None Help from another person taking care of personal grooming?: A Little Help from another person toileting, which includes using toliet, bedpan, or urinal?: A Lot Help from another person bathing (including washing, rinsing, drying)?: A Lot Help from another person to put on and taking off regular upper body clothing?: A Little Help from another person to put on and taking off regular lower body clothing?: A Lot 6 Click Score: 16   End of Session Equipment Utilized During Treatment: Gait belt;Rolling walker Nurse Communication: Mobility status  Activity Tolerance: Patient tolerated treatment well Patient left: in bed;with call bell/phone within reach;with bed alarm set  OT Visit Diagnosis: Unsteadiness on feet (R26.81);Muscle weakness (generalized) (M62.81);History of falling (Z91.81)                Time: 2595-6387 OT Time Calculation (min): 39 min Charges:  OT General Charges $OT Visit: 1 Visit OT Evaluation $OT Eval Moderate Complexity: 1 Mod OT Treatments $Self Care/Home Management : 23-37 mins  August Luz, OTR/L  Phylliss Bob 03/19/2020, 3:26 PM

## 2020-03-19 NOTE — Interval H&P Note (Signed)
History and Physical Interval Note:  03/19/2020 5:01 PM  [name] [name]  has presented today for surgery, with the diagnosis of BILATERAL URETERAL STONES.  The various methods of treatment have been discussed with the patient and family. After consideration of risks, benefits and other options for treatment, the patient has consented to  Procedure(s): CYSTOSCOPY WITH RETROGRADE PYELOGRAM/URETERAL STENT PLACEMENT (Bilateral) as a surgical intervention.  The patient's history has been reviewed, patient examined, no change in status, stable for surgery.  I have reviewed the patient's chart and labs.  Questions were answered to the patient's satisfaction.     Marton Redwood, III

## 2020-03-20 LAB — CBC WITH DIFFERENTIAL/PLATELET
Abs Immature Granulocytes: 0.04 10*3/uL (ref 0.00–0.07)
Basophils Absolute: 0 10*3/uL (ref 0.0–0.1)
Basophils Relative: 0 %
Eosinophils Absolute: 0 10*3/uL (ref 0.0–0.5)
Eosinophils Relative: 0 %
HCT: 33.1 % — ABNORMAL LOW (ref 39.0–52.0)
Hemoglobin: 10.6 g/dL — ABNORMAL LOW (ref 13.0–17.0)
Immature Granulocytes: 1 %
Lymphocytes Relative: 10 %
Lymphs Abs: 0.6 10*3/uL — ABNORMAL LOW (ref 0.7–4.0)
MCH: 31.5 pg (ref 26.0–34.0)
MCHC: 32 g/dL (ref 30.0–36.0)
MCV: 98.5 fL (ref 80.0–100.0)
Monocytes Absolute: 0.4 10*3/uL (ref 0.1–1.0)
Monocytes Relative: 6 %
Neutro Abs: 5.2 10*3/uL (ref 1.7–7.7)
Neutrophils Relative %: 83 %
Platelets: 152 10*3/uL (ref 150–400)
RBC: 3.36 MIL/uL — ABNORMAL LOW (ref 4.22–5.81)
RDW: 12.9 % (ref 11.5–15.5)
WBC: 6.2 10*3/uL (ref 4.0–10.5)
nRBC: 0 % (ref 0.0–0.2)

## 2020-03-20 LAB — COMPREHENSIVE METABOLIC PANEL
ALT: 13 U/L (ref 0–44)
AST: 14 U/L — ABNORMAL LOW (ref 15–41)
Albumin: 2.9 g/dL — ABNORMAL LOW (ref 3.5–5.0)
Alkaline Phosphatase: 57 U/L (ref 38–126)
Anion gap: 10 (ref 5–15)
BUN: 21 mg/dL (ref 8–23)
CO2: 26 mmol/L (ref 22–32)
Calcium: 8.9 mg/dL (ref 8.9–10.3)
Chloride: 105 mmol/L (ref 98–111)
Creatinine, Ser: 1.02 mg/dL (ref 0.61–1.24)
GFR calc Af Amer: >60 mL/min (ref >60)
GFR calc non Af Amer: >60 mL/min (ref >60)
Glucose, Bld: 128 mg/dL — ABNORMAL HIGH (ref 70–99)
Potassium: 4.9 mmol/L (ref 3.5–5.1)
Sodium: 141 mmol/L (ref 135–145)
Total Bilirubin: 0.5 mg/dL (ref 0.3–1.2)
Total Protein: 5.9 g/dL — ABNORMAL LOW (ref 6.5–8.1)

## 2020-03-20 LAB — PHOSPHORUS: Phosphorus: 3.7 mg/dL (ref 2.5–4.6)

## 2020-03-20 LAB — MAGNESIUM: Magnesium: 2 mg/dL (ref 1.7–2.4)

## 2020-03-20 MED ORDER — AMLODIPINE BESYLATE 5 MG PO TABS
5.0000 mg | ORAL_TABLET | Freq: Every day | ORAL | Status: DC
  Administered 2020-03-20 – 2020-03-23 (×4): 5 mg via ORAL
  Filled 2020-03-20 (×5): qty 1

## 2020-03-20 MED ORDER — CARVEDILOL 3.125 MG PO TABS
3.1250 mg | ORAL_TABLET | Freq: Two times a day (BID) | ORAL | Status: DC
  Administered 2020-03-20 – 2020-03-23 (×6): 3.125 mg via ORAL
  Filled 2020-03-20 (×6): qty 1

## 2020-03-20 MED ORDER — AMOXICILLIN 500 MG PO CAPS
500.0000 mg | ORAL_CAPSULE | Freq: Three times a day (TID) | ORAL | Status: DC
  Administered 2020-03-20 – 2020-03-23 (×9): 500 mg via ORAL
  Filled 2020-03-20 (×9): qty 1

## 2020-03-20 NOTE — Progress Notes (Signed)
Urology Inpatient Progress Report  Elevated troponin [R77.8] Acute cystitis without hematuria [N30.00] Sepsis secondary to UTI ([place]) [A41.9, N39.0] Sepsis, due to unspecified organism, unspecified whether acute organ dysfunction present ([place]) [A41.9]  Procedure(s): CYSTOSCOPY WITH RETROGRADE PYELOGRAM/URETERAL STENT PLACEMENT  1 Day Post-Op   Intv/Subj: No acute events overnight. Patient is without complaint.  He has no pain.  Labs are stable.  Good urine output.  Ceftriaxone was stopped and he is starting amoxicillin.  Per his wife, they are preparing for possible inpatient rehab versus SNF.  Principal Problem:   Sepsis secondary to UTI [facility]) Active Problems:   Prostate cancer ([place])   Demand ischemia ([place])   CKD (chronic kidney disease), stage IIIb   Hydronephrosis of right kidney  Current Facility-Administered Medications  Medication Dose Route Frequency Provider Last Rate Last Admin  . 0.9 %  sodium chloride infusion   Intravenous PRN Allie Bossier, MD 10 mL/hr at 03/19/20 0518 1,000 mL at 03/19/20 0518  . 0.9 %  sodium chloride infusion   Intravenous Continuous Allie Bossier, MD 50 mL/hr at 03/19/20 0300 Rate Verify at 03/19/20 0300  . acetaminophen (TYLENOL) tablet 650 mg  650 mg Oral Q6H PRN Etta Quill, DO       Or  . acetaminophen (TYLENOL) suppository 650 mg  650 mg Rectal Q6H PRN Etta Quill, DO      . albuterol (PROVENTIL) (2.5 MG/3ML) 0.083% nebulizer solution 2.5 mg  2.5 mg Inhalation Q6H PRN Etta Quill, DO      . amLODipine (NORVASC) tablet 5 mg  5 mg Oral Daily Allie Bossier, MD      . amoxicillin (AMOXIL) capsule 500 mg  500 mg Oral Q8H Allie Bossier, MD   500 mg at 03/20/20 1222  . carvedilol (COREG) tablet 3.125 mg  3.125 mg Oral BID WC Allie Bossier, MD      . Chlorhexidine Gluconate Cloth 2 % PADS 6 each  6 each Topical Daily Allie Bossier, MD   6 each at 03/20/20 1233  . enoxaparin (LOVENOX) injection 40 mg  40 mg Subcutaneous QHS  Jennette Kettle M, DO   40 mg at 03/19/20 2244  . fluticasone (FLONASE) 50 MCG/ACT nasal spray 2 spray  2 spray Each Nare Daily PRN Etta Quill, DO      . levothyroxine (SYNTHROID) tablet 150 mcg  150 mcg Oral QAC breakfast Jennette Kettle M, DO   150 mcg at 03/20/20 0700  . loratadine (CLARITIN) tablet 10 mg  10 mg Oral Daily Jennette Kettle M, DO   10 mg at 03/20/20 0956  . meloxicam (MOBIC) tablet 15 mg  15 mg Oral Daily Jennette Kettle M, DO   15 mg at 03/20/20 0956  . mirabegron ER (MYRBETRIQ) tablet 50 mg  50 mg Oral Daily Jennette Kettle M, DO   50 mg at 03/20/20 0956  . multivitamin with minerals tablet 1 tablet  1 tablet Oral Once per day on Mon Thu Gardner, Jared M, DO   1 tablet at 03/18/20 0848  . ondansetron (ZOFRAN) tablet 4 mg  4 mg Oral Q6H PRN Etta Quill, DO       Or  . ondansetron [facility]) injection 4 mg  4 mg Intravenous Q6H PRN Etta Quill, DO   4 mg at 03/19/20 1736  . oxyCODONE-acetaminophen (PERCOCET/ROXICET) 5-325 MG per tablet 1 tablet  1 tablet Oral Q6H PRN Etta Quill, DO   1 tablet at 03/18/20 2036  .  polyethylene glycol (MIRALAX / GLYCOLAX) packet 17 g  17 g Oral Daily PRN Vernelle Emerald, MD   17 g at 03/20/20 0955     Objective: Vital: Vitals:   03/20/20 0815 03/20/20 0903 03/20/20 0934 03/20/20 1030  BP: (!) 144/81 (!) 160/74 (!) 150/70 (!) 178/76  Pulse: 67 72 68 66  Resp: 19 20    Temp: 98 F (36.7 C) 97.9 F (36.6 C)    TempSrc: Oral Oral    SpO2: 99% 97%    Weight:      Height:       I/Os: I/O last 3 completed shifts: In: 1199.7 [P.O.:480; I.V.:519.7; IV Piggyback:200] Out: 2550 [Urine:2550]  Physical Exam:  General: Patient is in no apparent distress Lungs: Normal respiratory effort, chest expands symmetrically. GI: The abdomen is soft and nontender without mass. Foley: Draining clear yellow urine Ext: lower extremities symmetric  Lab Results: Recent Labs    03/18/20 0428 03/19/20 0606 03/20/20 0703  WBC 5.8 5.4  6.2  HGB 10.6* 10.6* 10.6*  HCT 35.1* 33.6* 33.1*   Recent Labs    03/18/20 0428 03/19/20 0606 03/20/20 0703  NA 138 138 141  K 4.7 4.3 4.9  CL 109 106 105  CO2 18* 21* 26  GLUCOSE 105* 94 128*  BUN 23 16 21   CREATININE 1.45* 0.95 1.02  CALCIUM 8.6* 8.6* 8.9   No results for input(s): LABPT, INR in the last 72 hours. No results for input(s): LABURIN in the last 72 hours. Results for orders placed or performed during the hospital encounter of 03/16/20  SARS Coronavirus 2 by RT PCR (hospital order, performed in [facility] hospital lab) Nasopharyngeal Nasopharyngeal Swab     Status: None   Collection Time: 03/16/20  6:38 PM   Specimen: Nasopharyngeal Swab  Result Value Ref Range Status   SARS Coronavirus 2 NEGATIVE NEGATIVE Final    Comment: (NOTE) SARS-CoV-2 target nucleic acids are NOT DETECTED.  The SARS-CoV-2 RNA is generally detectable in upper and lower respiratory specimens during the acute phase of infection. The lowest concentration of SARS-CoV-2 viral copies this assay can detect is 250 copies / mL. A negative result does not preclude SARS-CoV-2 infection and should not be used as the sole basis for treatment or other patient management decisions.  A negative result may occur with improper specimen collection / handling, submission of specimen other than nasopharyngeal swab, presence of viral mutation(s) within the areas targeted by this assay, and inadequate number of viral copies (<250 copies / mL). A negative result must be combined with clinical observations, patient history, and epidemiological information.  Fact Sheet for Patients:   StrictlyIdeas.no  Fact Sheet for Healthcare Providers: BankingDealers.co.za  This test is not yet approved or  cleared by the [place] FDA and has been authorized for detection and/or diagnosis of SARS-CoV-2 by FDA under an Emergency Use Authorization (EUA).  This EUA will  remain in effect (meaning this test can be used) for the duration of the COVID-19 declaration under Section 564(b)(1) of the Act, 21 U.S.C. section 360bbb-3(b)(1), unless the authorization is terminated or revoked sooner.  Performed at [facility], [place] [address], [place], [place] [address]   Blood culture (routine x 2)     Status: None (Preliminary result)   Collection Time: 03/16/20  6:55 PM   Specimen: BLOOD  Result Value Ref Range Status   Specimen Description BLOOD SITE NOT SPECIFIED  Final   Special Requests   Final  BOTTLES DRAWN AEROBIC AND ANAEROBIC Blood Culture adequate volume   Culture   Final    NO GROWTH 4 DAYS Performed at [facility], [place] [address], [place], [place] [address]    Report Status PENDING  Incomplete  Blood culture (routine x 2)     Status: None (Preliminary result)   Collection Time: 03/16/20  7:03 PM   Specimen: BLOOD  Result Value Ref Range Status   Specimen Description BLOOD RIGHT ANTECUBITAL  Final   Special Requests   Final    BOTTLES DRAWN AEROBIC AND ANAEROBIC Blood Culture adequate volume   Culture   Final    NO GROWTH 4 DAYS Performed at [facility], [place] [address] [place], [place] [address]    Report Status PENDING  Incomplete  Culture, Urine     Status: Abnormal   Collection Time: 03/16/20 10:52 PM   Specimen: Urine, Random  Result Value Ref Range Status   Specimen Description URINE, RANDOM  Final   Special Requests   Final    NONE Performed at [facility], [place] [address], [place], [place] [address]    Culture >=100,000 COLONIES/mL STAPHYLOCOCCUS SIMULANS (A)  Final   Report Status 03/19/2020 FINAL  Final   Organism ID, Bacteria STAPHYLOCOCCUS SIMULANS (A)  Final      Susceptibility   Staphylococcus simulans - MIC*    CIPROFLOXACIN >=8 RESISTANT Resistant     GENTAMICIN <=0.5 SENSITIVE Sensitive     NITROFURANTOIN <=16 SENSITIVE Sensitive     OXACILLIN <=0.25 SENSITIVE Sensitive     TETRACYCLINE  <=1 SENSITIVE Sensitive     VANCOMYCIN <=0.5 SENSITIVE Sensitive     TRIMETH/SULFA 40 SENSITIVE Sensitive     CLINDAMYCIN <=0.25 SENSITIVE Sensitive     RIFAMPIN <=0.5 SENSITIVE Sensitive     Inducible Clindamycin NEGATIVE Sensitive     * >=100,000 COLONIES/mL STAPHYLOCOCCUS SIMULANS    Studies/Results: CT RENAL STONE STUDY  Result Date: 03/18/2020 CLINICAL DATA:  Hydronephrosis on renal ultrasound yesterday. EXAM: CT ABDOMEN AND PELVIS WITHOUT CONTRAST TECHNIQUE: Multidetector CT imaging of the abdomen and pelvis was performed following the standard protocol without IV contrast. COMPARISON:  Renal ultrasound yesterday.  Abdominal CT 09/23/2019 FINDINGS: Lower chest: Right calcified pleural plaques. There are small bilateral pleural effusions, right greater than left. Mitral annulus calcifications, coronary artery calcifications. Normal heart size. Mild distal esophageal wall thickening. Hepatobiliary: No evidence of focal hepatic lesion on noncontrast exam. Gallbladder physiologically distended, no calcified stone. No biliary dilatation. Pancreas: Hypodense lesion in the pancreatic tail measures approximately 2.7 cm, this is less well-defined currently than on previous in the absence of IV contrast. No ductal dilatation or inflammation. Spleen: Normal in size without focal abnormality. Adrenals/Urinary Tract: No adrenal nodule. Obstructing 4 mm stone at the right ureterovesicular junction with moderate to severe hydroureteronephrosis. There are additional dependent contiguous stones within the distal right ureter that span 2 cm. Mild right perinephric edema. There are 3 nonobstructing stones in the right kidney measuring 4 and 5 mm. 9 mm hyperdense lesion in the posterior right kidney. Mild left hydroureteronephrosis. This has slightly progressed from prior exam. There is a 6 mm stone in the distal left ureter approximately 9 mm from the bladder insertion, not definitively cause for obstruction of the  ureter is dilated distal to this. Mild left perinephric edema. Multiple nonobstructing stones in the left kidney. Urinary bladder is partially distended. No definite bladder wall thickening or bladder stone. Stomach/Bowel: Mild distal colonic diverticulosis without  diverticulitis. There is no evidence of bowel obstruction or inflammation. Normal appendix. Stomach is unremarkable. Vascular/Lymphatic: Aorto bi-iliac atherosclerosis. Progressive retroperitoneal adenopathy from prior exam. Left common iliac node currently measures 18 mm, series 3, image 62, previously 15 mm. Left internal iliac nodes are difficult to assess given lack of IV contrast on the current exam but appear progressed. There is new adenopathy in the upper left periaortic station the level of the duodenum with 17 mm lymph node, series 3, image 47. Detailed assessment for adenopathy is inherently limited in the absence of IV contrast. Reproductive: Brachytherapy seeds in the prostate. Other: Bilateral perinephric edema, right greater than left. Inflammatory change about the right pericolic gutter. No ascites or free air. No evidence of intra-abdominal fluid collections. Patchy air in the anterior abdominal wall typical of medication injection site. Musculoskeletal: Right hip arthroplasty. Degenerative change in the left hip. Lytic lesion involving the left pubic body for example series 6, image 59 and series 3, image 90. Soft tissue component with loss of cortex posteriorly. No evidence of pathologic fracture. No evidence of additional osseous lesion. Remote left posterior rib fractures. Diffuse degenerative change throughout the spine. IMPRESSION: 1. Obstructing 4 mm stone at the right ureterovesicular junction with moderate to severe right hydroureteronephrosis. There are additional dependent contiguous stones within the distal right ureter that span 2 cm. 2. Mild left hydroureteronephrosis has slightly progressed from prior exam. There is a 6 mm  stone in the distal left ureter approximately 9 mm from the bladder insertion, not definitively cause for obstruction as the ureter is dilated distal to this. 3. Bilateral nonobstructing renal calculi. 4. Hypodense lesion in the pancreatic tail measuring 2.7 cm, less well-defined currently than on previous exam in the absence of IV contrast. Further evaluation with MRI on an elective basis again recommended. 5. Progressive retroperitoneal adenopathy, suspicious for metastatic disease. Increased size of prior lymph nodes as well as new lymphadenopathy. 6. Lytic lesion involving the left pubic body highly suspicious for metastatic disease. No evidence of pathologic fracture. This may be related to known prostatic malignancy, however is unusual as prostate metastasis are typically sclerotic. 7. Small bilateral pleural effusions, right greater than left. Aortic Atherosclerosis (ICD10-I70.0). Electronically Signed   By: Keith Rake M.D.   On: 03/18/2020 20:40    Assessment: Bilateral ureteral obstruction secondary to calculi status post bilateral ureteral stent placement  Procedure(s): CYSTOSCOPY WITH RETROGRADE PYELOGRAM/URETERAL STENT PLACEMENT, 1 Day Post-Op  doing well.  Plan: May discontinue Foley catheter.  Agree with switching antibiotic to amoxicillin.  He can follow-up outpatient for surgical planning of bilateral ureteroscopy.   Link Snuffer, MD Urology 03/20/2020, 12:50 PM

## 2020-03-20 NOTE — Anesthesia Postprocedure Evaluation (Signed)
Anesthesia Post Note  Patient: [name] [name]  Procedure(s) Performed: CYSTOSCOPY WITH RETROGRADE PYELOGRAM/URETERAL STENT PLACEMENT (Bilateral Ureter)     Patient location during evaluation: PACU Anesthesia Type: General Level of consciousness: awake and alert Pain management: pain level controlled Vital Signs Assessment: post-procedure vital signs reviewed and stable Respiratory status: spontaneous breathing, nonlabored ventilation and respiratory function stable Cardiovascular status: blood pressure returned to baseline and stable Postop Assessment: no apparent nausea or vomiting Anesthetic complications: no   No complications documented.  Last Vitals:  Vitals:   03/20/20 0934 03/20/20 1030  BP: (!) 150/70 (!) 178/76  Pulse: 68 66  Resp:    Temp:    SpO2:      Last Pain:  Vitals:   03/20/20 0903  TempSrc: Oral  PainSc:                  [name],W. EDMOND

## 2020-03-20 NOTE — Progress Notes (Signed)
PROGRESS NOTE    [name] [name]  RAQ:[id number] DOB: 1932-07-29 DOA: 03/16/2020 PCP: Maury Dus, MD     Brief Narrative:  85 y.o. WM (former [employer]) PMHx HTN, RBBB, prostate CA. nephrolithiasis, Hx urinary tract obstruction, hypothyroidism, CKD stage IIIb  Pt presents to ED with c/o generalized weakness.  Patient states that he was transferring from his chair and felt weak and fell face forward. He states that he is wife called immediately. EMS noticed that he was on the floor and was having some chest pain. His EKG in the field showed STEMI in V1 and V2. Patient states that he has not been on it for that long. He states that he was given aspirin 324 mg by EMS. He has no chest pain currently. Patient is not on any blood thinners.   ED Course: Cards saw pt: not a STEMI.  Found to have Tm 101.  Found to have UTI.  WBC 11.6.  Lactate 3.3.  Creat 1.6, slightly up from baseline of 1.35 earlier this year.  Trops were 43 and 124.  Cards feels that this is demand ischemia, pt remains CP free.    Subjective: 6/26 overnight afebrile afebrile overnight, A/O x4, negative CP, negative S OB, negative N/V.  Patient states feels significantly better today.   Assessment & Plan: Covid vaccination; positive vaccination     Principal Problem:   Sepsis secondary to UTI [facility]) Active Problems:   Prostate cancer ([place])   Demand ischemia ([place])   CKD (chronic kidney disease), stage IIIb   Hydronephrosis of right kidney   Sepsis UTI/severe sepsis/ positive Staph Simulans -On admission met criteria for sepsis temp> 38 C, HR> 90 bpm, RR> 20; lactic acid> 2 -Elevated procalcitonin 1.71, will complete 5-day course antibiotics. -6/24 urine cultures positive for Staph Simulans -Blood culture still NGTD -6/24 decrease Normal Saline 50 ml/hr -Continue current antibiotic until susceptibilities result.  Spoke with Dr. Baxter Flattery ID and she states if pansensitive can change over to  amoxicillin or Keflex to complete treatment. -6/26 staph is pansensitive except to Cipro start Amoxicillin 500 mg TID.  Since patient has stents would continue antibiotics until seen by urology at follow-up unless they have different recommendation.  Increasing PSA/RIGHT & LEFT Hydronephrosis with nephrolithiasis -Received a call from Dr. [name] Urology, who has treated patient in the past for his prostate cancer and urinary tract obstruction.  Concerned that with an increasing PSA and lymphadenopathy (unable to see in epic), that patient may require cystoscopy and stent or percutaneous drain.   -Have ordered CT abdomen pelvis W0 contrast stone protocol as requested. -Dr. Jeffie Pollock will see patient in a.m. await recommendations -6/25 s/p of bilateral ureteral stent see procedures below -Per urology As long as he is doing well tomorrow, can remove Foley catheter.  He will eventually need bilateral ureteroscopy with laser lithotripsy and ureteral stent placement/exchange  Lytic lesion LEFT pubic body -6/25 discussed case with Dr. Zola Button oncology who is aware of findings, and attributes them to worsening prostate cancer.  Will address as outpatient.  Not a candidate for aggressive care.  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 Range 03/16/2020 18:36 03/16/2020 20:49 03/17/2020 01:27 03/17/2020 02:53  Troponin I (High Sensitivity) Latest Ref Range: <18 ng/L 43 (H) 124 (HH) 152 (HH) 143 (HH)    Prostate cancer -Currently not  on any treatment.  CKD stage IIIb (baseline Cr 1.35) Recent Labs  Lab 03/16/20 1836 03/16/20 1859 03/17/20 0253 03/18/20 0428 03/19/20 0606  CREATININE 1.60* 1.50* 1.32* 1.45* 0.95  -Slightly above baseline -Avoid all nephrotoxic medication    Goals of care -6/24 PT/OT consult; demand ischemia/chronic diastolic CHF evaluate patient for CIR vs SNF    DVT prophylaxis: Lovenox Code Status: Full Family Communication: 6/25 wife and son at bedside for discussion of plan of care Status is: Inpatient    Dispo: The patient is from: Home              Anticipated d/c is to: Home              Anticipated d/c date is: 6/28              Patient currently unstable      Consultants:  Cardiology Phone consult Dr. Carlyle Basques ID Dr. [name] Urology Phone consult Dr. Zola Button oncology    Procedures/Significant Events:  6/22 Renal Ultrasound;-Increased renal cortical echogenicity compatible with medical renal disease. -Moderate right hydronephrosis without visible shadowing calculus. Distal calculus is not excluded. 6/23 echocardiogram; Left Ventricle: LVEF= 60 to 65%.  -Grade II diastolic dysfunction (pseudonormalization).  6/24 CT renal stone study;-obstructing 4 mm stone at the RIGHT ureterovesicular junction with moderate to severe right hydroureteronephrosis. There are additional dependent contiguous stones within the distal right ureter that span 2 cm. -Mild LEFT hydroureteronephrosis has slightly progressed from prior exam. There is a 6 mm stone in the distal left ureter approximately 9 mm from the bladder insertion, not definitively cause for obstruction as the ureter is dilated distal to this. -Bilateral nonobstructing renal calculi. -Hypodense lesion in the pancreatic tail measuring 2.7 cm, less well-defined currently than on previous exam in the absence of IV contrast. Further evaluation with MRI on an elective basis again recommended. -Progressive  retroperitoneal adenopathy, suspicious for metastatic disease. Increased size of prior lymph nodes as well as new lymphadenopathy. -Lytic lesion involving the left pubic body highly suspicious for metastatic disease. No evidence of pathologic fracture. This may be related to known prostatic malignancy, however is unusual as prostate metastasis are typically sclerotic. -Small bilateral pleural effusions, right greater than left. 6/25 Bilateral 6 x 26 double-J ureteral stent placed     I have personally reviewed and interpreted all radiology studies and my findings are as above.  VENTILATOR SETTINGS:    Cultures 6/22 SARS coronavirus negative 6/22 urine positive Staph Simulans 6/22 blood NGTD 6/22 blood RIGHT AC NGTD    Antimicrobials: Anti-infectives (From admission, onward)   Start     Dose/Rate Stop   03/17/20 0600  cefTRIAXone (ROCEPHIN) 1 g in sodium chloride 0.9 % 100 mL IVPB     Discontinue     1 g 200 mL/hr over 30 Minutes     03/16/20 2300  ceFEPIme (MAXIPIME) 1 g in sodium chloride 0.9 % 100 mL IVPB        1 g 200 mL/hr over 30 Minutes 03/17/20 0007   03/16/20 2230  vancomycin (VANCOCIN) IVPB 1000 mg/200 mL premix  Status:  Discontinued        1,000  mg 200 mL/hr over 60 Minutes 03/16/20 2349       Devices    LINES / TUBES:  18 French Foley catheter 6/25>> Bilateral 6 x 26 double-J ureteral stent placed 6/25>>   Continuous Infusions: . sodium chloride 1,000 mL (03/19/20 0518)  . sodium chloride 50 mL/hr at 03/19/20 0300  . cefTRIAXone (ROCEPHIN)  IV 1 g (03/20/20 0659)     Objective: Vitals:   03/19/20 2145 03/20/20 0126 03/20/20 0347 03/20/20 0815  BP: 127/73 134/61 (!) 163/70 (!) 144/81  Pulse: 70 (!) 59 63 67  Resp: '18 20 20 19  '$ Temp: 98.8 F (37.1 C) 98.7 F (37.1 C) 98 F (36.7 C) 98 F (36.7 C)  TempSrc: Oral  Oral Oral  SpO2: 93% 95% 98% 99%  Weight:   98.1 kg   Height:        Intake/Output Summary (Last 24 hours) at 03/20/2020  9798 Last data filed at 03/20/2020 9211 Gross per 24 hour  Intake 580 ml  Output 1700 ml  Net -1120 ml   Filed Weights   03/18/20 0500 03/19/20 0432 03/20/20 0347  Weight: 101.2 kg 98.7 kg 98.1 kg   Physical Exam:  General: A/O x4, no acute respiratory distress Eyes: negative scleral hemorrhage, negative anisocoria, negative icterus ENT: Negative Runny nose, negative gingival bleeding, Neck:  Negative scars, masses, torticollis, lymphadenopathy, JVD Lungs: Clear to auscultation bilaterally without wheezes or crackles Cardiovascular: Regular rate and rhythm without murmur gallop or rub normal S1 and S2 Abdomen: MORBIDLY OBESE, negative abdominal pain, nondistended, positive soft, bowel sounds, no rebound, no ascites, no appreciable mass Extremities: No significant cyanosis, clubbing, or edema bilateral lower extremities Skin: Negative rashes, lesions, ulcers Psychiatric:  Negative depression, negative anxiety, negative fatigue, negative mania  Central nervous system:  Cranial nerves II through XII intact, tongue/uvula midline, all extremities muscle strength 5/5, sensation intact throughout, negative dysarthria, negative expressive aphasia, negative receptive aphasia.  .     Data Reviewed: Care during the described time interval was provided by me .  I have reviewed this patient's available data, including medical history, events of note, physical examination, and all test results as part of my evaluation.  CBC: Recent Labs  Lab 03/16/20 1836 03/16/20 1836 03/16/20 1859 03/17/20 0253 03/18/20 0428 03/19/20 0606 03/20/20 0703  WBC 11.6*  --   --  8.4 5.8 5.4 6.2  NEUTROABS 10.4*  --   --   --  4.2 3.9 5.2  HGB 11.3*   < > 11.2* 9.9* 10.6* 10.6* 10.6*  HCT 35.9*   < > 33.0* 31.1* 35.1* 33.6* 33.1*  MCV 100.3*  --   --  101.0* 105.4* 101.2* 98.5  PLT 167  --   --  144* 125* 129* 152   < > = values in this interval not displayed.   Basic Metabolic Panel: Recent Labs  Lab  03/16/20 1836 03/16/20 1859 03/17/20 0253 03/18/20 0428 03/19/20 0606  NA 135 140 140 138 138  K 4.3 4.2 4.7 4.7 4.3  CL 106 107 111 109 106  CO2 18*  --  22 18* 21*  GLUCOSE 174* 174* 120* 105* 94  BUN 26* 28* 25* 23 16  CREATININE 1.60* 1.50* 1.32* 1.45* 0.95  CALCIUM 9.0  --  8.6* 8.6* 8.6*  MG  --   --   --  1.9 1.9  PHOS  --   --   --  3.2 2.9   GFR: Estimated Creatinine Clearance: 63.4 mL/min (by C-G formula  based on SCr of 0.95 mg/dL). Liver Function Tests: Recent Labs  Lab 03/16/20 1836 03/18/20 0428 03/19/20 0606  AST 28 28 14*  ALT '20 14 12  '$ ALKPHOS 66 63 56  BILITOT 0.6 0.9 0.8  PROT 6.7 6.0* 5.6*  ALBUMIN 3.8 3.1* 2.8*   No results for input(s): LIPASE, AMYLASE in the last 168 hours. No results for input(s): AMMONIA in the last 168 hours. Coagulation Profile: No results for input(s): INR, PROTIME in the last 168 hours. Cardiac Enzymes: Recent Labs  Lab 03/16/20 1836  CKTOTAL 94   BNP (last 3 results) No results for input(s): PROBNP in the last 8760 hours. HbA1C: No results for input(s): HGBA1C in the last 72 hours. CBG: No results for input(s): GLUCAP in the last 168 hours. Lipid Profile: No results for input(s): CHOL, HDL, LDLCALC, TRIG, CHOLHDL, LDLDIRECT in the last 72 hours. Thyroid Function Tests: No results for input(s): TSH, T4TOTAL, FREET4, T3FREE, THYROIDAB in the last 72 hours. Anemia Panel: No results for input(s): VITAMINB12, FOLATE, FERRITIN, TIBC, IRON, RETICCTPCT in the last 72 hours. Sepsis Labs: Recent Labs  Lab 03/16/20 1855 03/16/20 2049 03/17/20 0835 03/17/20 1059 03/18/20 0428 03/19/20 0606  PROCALCITON  --   --  1.71  --  1.06 0.54  LATICACIDVEN 3.3* 2.3* 1.1 1.9  --   --     Recent Results (from the past 240 hour(s))  SARS Coronavirus 2 by RT PCR (hospital order, performed in [place] hospital lab) Nasopharyngeal Nasopharyngeal Swab     Status: None   Collection Time: 03/16/20  6:38 PM   Specimen:  Nasopharyngeal Swab  Result Value Ref Range Status   SARS Coronavirus 2 NEGATIVE NEGATIVE Final    Comment: (NOTE) SARS-CoV-2 target nucleic acids are NOT DETECTED.  The SARS-CoV-2 RNA is generally detectable in upper and lower respiratory specimens during the acute phase of infection. The lowest concentration of SARS-CoV-2 viral copies this assay can detect is 250 copies / mL. A negative result does not preclude SARS-CoV-2 infection and should not be used as the sole basis for treatment or other patient management decisions.  A negative result may occur with improper specimen collection / handling, submission of specimen other than nasopharyngeal swab, presence of viral mutation(s) within the areas targeted by this assay, and inadequate number of viral copies (<250 copies / mL). A negative result must be combined with clinical observations, patient history, and epidemiological information.  Fact Sheet for Patients:   StrictlyIdeas.no  Fact Sheet for Healthcare Providers: BankingDealers.co.za  This test is not yet approved or  cleared by the [place] FDA and has been authorized for detection and/or diagnosis of SARS-CoV-2 by FDA under an Emergency Use Authorization (EUA).  This EUA will remain in effect (meaning this test can be used) for the duration of the COVID-19 declaration under Section 564(b)(1) of the Act, 21 U.S.C. section 360bbb-3(b)(1), unless the authorization is terminated or revoked sooner.  Performed at [facility], [place] [address] [place], [place] [address]   Blood culture (routine x 2)     Status: None (Preliminary result)   Collection Time: 03/16/20  6:55 PM   Specimen: BLOOD  Result Value Ref Range Status   Specimen Description BLOOD SITE NOT SPECIFIED  Final   Special Requests   Final    BOTTLES DRAWN AEROBIC AND ANAEROBIC Blood Culture adequate volume   Culture   Final    NO GROWTH 4  DAYS Performed at [facility], [address], [place], [place] [address]    Report Status PENDING  Incomplete  Blood culture (routine x 2)     Status: None (Preliminary result)   Collection Time: 03/16/20  7:03 PM   Specimen: BLOOD  Result Value Ref Range Status   Specimen Description BLOOD RIGHT ANTECUBITAL  Final   Special Requests   Final    BOTTLES DRAWN AEROBIC AND ANAEROBIC Blood Culture adequate volume   Culture   Final    NO GROWTH 4 DAYS Performed at [facility], [place] [address], [place], [place] [address]    Report Status PENDING  Incomplete  Culture, Urine     Status: Abnormal   Collection Time: 03/16/20 10:52 PM   Specimen: Urine, Random  Result Value Ref Range Status   Specimen Description URINE, RANDOM  Final   Special Requests   Final    NONE Performed at [facility], [place] [address] [place],  [address]    Culture >=100,000 COLONIES/mL STAPHYLOCOCCUS SIMULANS (A)  Final   Report Status 03/19/2020 FINAL  Final   Organism ID, Bacteria STAPHYLOCOCCUS SIMULANS (A)  Final      Susceptibility   Staphylococcus simulans - MIC*    CIPROFLOXACIN >=8 RESISTANT Resistant     GENTAMICIN <=0.5 SENSITIVE Sensitive     NITROFURANTOIN <=16 SENSITIVE Sensitive     OXACILLIN <=0.25 SENSITIVE Sensitive     TETRACYCLINE <=1 SENSITIVE Sensitive     VANCOMYCIN <=0.5 SENSITIVE Sensitive     TRIMETH/SULFA 40 SENSITIVE Sensitive     CLINDAMYCIN <=0.25 SENSITIVE Sensitive     RIFAMPIN <=0.5 SENSITIVE Sensitive     Inducible Clindamycin NEGATIVE Sensitive     * >=100,000 COLONIES/mL STAPHYLOCOCCUS SIMULANS         Radiology Studies: CT RENAL STONE STUDY  Result Date: 03/18/2020 CLINICAL DATA:  Hydronephrosis on renal ultrasound yesterday. EXAM: CT ABDOMEN AND PELVIS WITHOUT CONTRAST TECHNIQUE: Multidetector CT imaging of the abdomen and pelvis was performed following the standard protocol without IV contrast. COMPARISON:  Renal ultrasound yesterday.   Abdominal CT 09/23/2019 FINDINGS: Lower chest: Right calcified pleural plaques. There are small bilateral pleural effusions, right greater than left. Mitral annulus calcifications, coronary artery calcifications. Normal heart size. Mild distal esophageal wall thickening. Hepatobiliary: No evidence of focal hepatic lesion on noncontrast exam. Gallbladder physiologically distended, no calcified stone. No biliary dilatation. Pancreas: Hypodense lesion in the pancreatic tail measures approximately 2.7 cm, this is less well-defined currently than on previous in the absence of IV contrast. No ductal dilatation or inflammation. Spleen: Normal in size without focal abnormality. Adrenals/Urinary Tract: No adrenal nodule. Obstructing 4 mm stone at the right ureterovesicular junction with moderate to severe hydroureteronephrosis. There are additional dependent contiguous stones within the distal right ureter that span 2 cm. Mild right perinephric edema. There are 3 nonobstructing stones in the right kidney measuring 4 and 5 mm. 9 mm hyperdense lesion in the posterior right kidney. Mild left hydroureteronephrosis. This has slightly progressed from prior exam. There is a 6 mm stone in the distal left ureter approximately 9 mm from the bladder insertion, not definitively cause for obstruction of the ureter is dilated distal to this. Mild left perinephric edema. Multiple nonobstructing stones in the left kidney. Urinary bladder is partially distended. No definite bladder wall thickening or bladder stone. Stomach/Bowel: Mild distal colonic diverticulosis without diverticulitis. There is no evidence of bowel obstruction or inflammation. Normal appendix. Stomach is unremarkable. Vascular/Lymphatic: Aorto bi-iliac atherosclerosis. Progressive retroperitoneal adenopathy from prior exam.  Left common iliac node currently measures 18 mm, series 3, image 62, previously 15 mm. Left internal iliac nodes are difficult to assess given lack of  IV contrast on the current exam but appear progressed. There is new adenopathy in the upper left periaortic station the level of the duodenum with 17 mm lymph node, series 3, image 47. Detailed assessment for adenopathy is inherently limited in the absence of IV contrast. Reproductive: Brachytherapy seeds in the prostate. Other: Bilateral perinephric edema, right greater than left. Inflammatory change about the right pericolic gutter. No ascites or free air. No evidence of intra-abdominal fluid collections. Patchy air in the anterior abdominal wall typical of medication injection site. Musculoskeletal: Right hip arthroplasty. Degenerative change in the left hip. Lytic lesion involving the left pubic body for example series 6, image 59 and series 3, image 90. Soft tissue component with loss of cortex posteriorly. No evidence of pathologic fracture. No evidence of additional osseous lesion. Remote left posterior rib fractures. Diffuse degenerative change throughout the spine. IMPRESSION: 1. Obstructing 4 mm stone at the right ureterovesicular junction with moderate to severe right hydroureteronephrosis. There are additional dependent contiguous stones within the distal right ureter that span 2 cm. 2. Mild left hydroureteronephrosis has slightly progressed from prior exam. There is a 6 mm stone in the distal left ureter approximately 9 mm from the bladder insertion, not definitively cause for obstruction as the ureter is dilated distal to this. 3. Bilateral nonobstructing renal calculi. 4. Hypodense lesion in the pancreatic tail measuring 2.7 cm, less well-defined currently than on previous exam in the absence of IV contrast. Further evaluation with MRI on an elective basis again recommended. 5. Progressive retroperitoneal adenopathy, suspicious for metastatic disease. Increased size of prior lymph nodes as well as new lymphadenopathy. 6. Lytic lesion involving the left pubic body highly suspicious for metastatic  disease. No evidence of pathologic fracture. This may be related to known prostatic malignancy, however is unusual as prostate metastasis are typically sclerotic. 7. Small bilateral pleural effusions, right greater than left. Aortic Atherosclerosis (ICD10-I70.0). Electronically Signed   By: Keith Rake M.D.   On: 03/18/2020 20:40        Scheduled Meds: . Chlorhexidine Gluconate Cloth  6 each Topical Daily  . enoxaparin (LOVENOX) injection  40 mg Subcutaneous QHS  . levothyroxine  150 mcg Oral QAC breakfast  . loratadine  10 mg Oral Daily  . meloxicam  15 mg Oral Daily  . metoprolol tartrate  12.5 mg Oral BID  . mirabegron ER  50 mg Oral Daily  . multivitamin with minerals  1 tablet Oral Once per day on Mon Thu   Continuous Infusions: . sodium chloride 1,000 mL (03/19/20 0518)  . sodium chloride 50 mL/hr at 03/19/20 0300  . cefTRIAXone (ROCEPHIN)  IV 1 g (03/20/20 0659)     LOS: 4 days    Time spent:40 min    Vickye Astorino, Geraldo Docker, MD [facility] Pager [phone]  If 7PM-7AM, please contact night-coverage [email or web address] Password [facility] 03/20/2020, 8:38 AM

## 2020-03-20 NOTE — Progress Notes (Signed)
Inpatient Rehab Admissions Coordinator:   Met with patient, wife, and son at bedside to discuss potential CIR admission. Pt. Stated interest. Will pursue for potential admit next week, pending bed availability and insurance approval. Pt. And family state preference for home with home health if insurance denies CIR admission. Clemens Catholic, [place], [place] Admissions Coordinator  [phone] ([place]) [phone] (office)

## 2020-03-21 ENCOUNTER — Encounter (HOSPITAL_COMMUNITY): Payer: Self-pay | Admitting: Urology

## 2020-03-21 LAB — CBC WITH DIFFERENTIAL/PLATELET
Abs Immature Granulocytes: 0.03 10*3/uL (ref 0.00–0.07)
Basophils Absolute: 0 10*3/uL (ref 0.0–0.1)
Basophils Relative: 1 %
Eosinophils Absolute: 0.1 10*3/uL (ref 0.0–0.5)
Eosinophils Relative: 2 %
HCT: 30.3 % — ABNORMAL LOW (ref 39.0–52.0)
Hemoglobin: 9.7 g/dL — ABNORMAL LOW (ref 13.0–17.0)
Immature Granulocytes: 1 %
Lymphocytes Relative: 20 %
Lymphs Abs: 1.1 10*3/uL (ref 0.7–4.0)
MCH: 31.8 pg (ref 26.0–34.0)
MCHC: 32 g/dL (ref 30.0–36.0)
MCV: 99.3 fL (ref 80.0–100.0)
Monocytes Absolute: 0.4 10*3/uL (ref 0.1–1.0)
Monocytes Relative: 8 %
Neutro Abs: 3.7 10*3/uL (ref 1.7–7.7)
Neutrophils Relative %: 68 %
Platelets: 152 10*3/uL (ref 150–400)
RBC: 3.05 MIL/uL — ABNORMAL LOW (ref 4.22–5.81)
RDW: 13.1 % (ref 11.5–15.5)
WBC: 5.4 10*3/uL (ref 4.0–10.5)
nRBC: 0 % (ref 0.0–0.2)

## 2020-03-21 LAB — CULTURE, BLOOD (ROUTINE X 2)
Culture: NO GROWTH
Culture: NO GROWTH
Special Requests: ADEQUATE
Special Requests: ADEQUATE

## 2020-03-21 LAB — COMPREHENSIVE METABOLIC PANEL
ALT: 16 U/L (ref 0–44)
AST: 16 U/L (ref 15–41)
Albumin: 2.8 g/dL — ABNORMAL LOW (ref 3.5–5.0)
Alkaline Phosphatase: 59 U/L (ref 38–126)
Anion gap: 9 (ref 5–15)
BUN: 26 mg/dL — ABNORMAL HIGH (ref 8–23)
CO2: 26 mmol/L (ref 22–32)
Calcium: 8.6 mg/dL — ABNORMAL LOW (ref 8.9–10.3)
Chloride: 104 mmol/L (ref 98–111)
Creatinine, Ser: 0.98 mg/dL (ref 0.61–1.24)
GFR calc Af Amer: >60 mL/min (ref >60)
GFR calc non Af Amer: >60 mL/min (ref >60)
Glucose, Bld: 101 mg/dL — ABNORMAL HIGH (ref 70–99)
Potassium: 4.1 mmol/L (ref 3.5–5.1)
Sodium: 139 mmol/L (ref 135–145)
Total Bilirubin: 0.3 mg/dL (ref 0.3–1.2)
Total Protein: 5.4 g/dL — ABNORMAL LOW (ref 6.5–8.1)

## 2020-03-21 LAB — MAGNESIUM: Magnesium: 1.8 mg/dL (ref 1.7–2.4)

## 2020-03-21 LAB — PHOSPHORUS: Phosphorus: 3.4 mg/dL (ref 2.5–4.6)

## 2020-03-21 NOTE — Progress Notes (Signed)
Inpatient Rehab Admissions Coordinator:   Met with patient and his wife again at bedside. Pt. States that he feels that he is stronger and more able to do things for himself today. Following discussion with his wife, Pt. States he would like to go home tomorrow with home health and does not wish to pursue CIR consult at this time. I will sign off.   Clemens Catholic, [place], [place] Admissions Coordinator  [phone] (office

## 2020-03-21 NOTE — Progress Notes (Signed)
PROGRESS NOTE    [name] [name]  ALP:[id number] DOB: [birth year] DOA: 03/16/2020 PCP: [name] Dus, MD     Brief Narrative:  86 y.o. WM (former [employer]) PMHx HTN, RBBB, prostate CA. nephrolithiasis, Hx urinary tract obstruction, hypothyroidism, CKD stage IIIb  Pt presents to ED with c/o generalized weakness.  Patient states that he was transferring from his chair and felt weak and fell face forward. He states that he is wife called immediately. EMS noticed that he was on the floor and was having some chest pain. His EKG in the field showed STEMI in V1 and V2. Patient states that he has not been on it for that long. He states that he was given aspirin 324 mg by EMS. He has no chest pain currently. Patient is not on any blood thinners.   ED Course: Cards saw pt: not a STEMI.  Found to have Tm 101.  Found to have UTI.  WBC 11.6.  Lactate 3.3.  Creat 1.6, slightly up from baseline of 1.35 earlier this year.  Trops were 43 and 124.  Cards feels that this is demand ischemia, pt remains CP free.    Subjective: 6/27 afebrile overnight.  A/O x4, negative CP, negative S OB, negative N/V.  Negative pyuria.  Foley removed, no feeling of retention   Assessment & Plan: Covid vaccination; positive vaccination     Principal Problem:   Sepsis secondary to UTI [facility]) Active Problems:   Prostate cancer ([place])   Demand ischemia ([place])   CKD (chronic kidney disease), stage IIIb   Hydronephrosis of right kidney   Sepsis UTI/severe sepsis/ positive Staph Simulans -On admission met criteria for sepsis temp> 38 C, HR> 90 bpm, RR> 20; lactic acid> 2 -Elevated procalcitonin 1.71, will complete 5-day course antibiotics. -6/24 urine cultures positive for Staph Simulans -Blood culture still NGTD -6/24 decrease Normal Saline 50 ml/hr -Continue current antibiotic until susceptibilities result.  Spoke with Dr. Baxter [name] ID and she states if pansensitive can change over to amoxicillin or  Keflex to complete treatment. -6/26 staph is pansensitive except to Cipro start Amoxicillin 500 mg TID.  Since patient has stents would continue antibiotics until seen by urology at follow-up unless they have different recommendation.  Increasing PSA/RIGHT & LEFT Hydronephrosis with nephrolithiasis -Received a call from Dr. Irine [name] Urology, who has treated patient in the past for his prostate cancer and urinary tract obstruction.  Concerned that with an increasing PSA and lymphadenopathy (unable to see in epic), that patient may require cystoscopy and stent or percutaneous drain.   -Have ordered CT abdomen pelvis W0 contrast stone protocol as requested. -Dr. Jeffie [name] will see patient in a.m. await recommendations -6/25 s/p of bilateral ureteral stent see procedures below -Per urology As long as he is doing well tomorrow, can remove Foley catheter.  He will eventually need bilateral ureteroscopy with laser lithotripsy and ureteral stent placement/exchange -6/26 Foley removed -6/27 in a.m. contact Dr. Irine [name] Urology schedule follow-up appointment for stent removal.   Lytic lesion LEFT pubic body -6/25 discussed case with Dr. Zola [name] oncology who is aware of findings, and attributes them to worsening prostate cancer.  Will address as outpatient.  Not a candidate for aggressive care. -6/27 in a.m. contact Dr. 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 Range 03/16/2020  18:36 03/16/2020 20:49 03/17/2020 01:27 03/17/2020 02:53  Troponin I (High Sensitivity) Latest Ref Range: <18 ng/L 43 (H) 124 (HH) 152 (HH) 143 (HH)    Prostate cancer -Currently not on any treatment.  CKD stage IIIb (baseline Cr 1.35) Recent Labs  Lab 03/17/20 0253 03/18/20 0428 03/19/20 0606 03/20/20 0703 03/21/20 0304  CREATININE 1.32* 1.45* 0.95 1.02 0.98  -Better than baseline. -Avoid all nephrotoxic medication    Goals of care -6/24 PT/OT consult; demand ischemia/chronic diastolic CHF evaluate patient for CIR vs SNF -6/27 patient and family only interested in being discharged to [facility] or home health.    DVT prophylaxis: Lovenox Code Status: Full Family Communication: 6/25 wife and son at bedside for discussion of plan of care Status is: Inpatient    Dispo: The patient is from: Home              Anticipated d/c is to: Home              Anticipated d/c date is: 6/29              Patient currently unstable      Consultants:  Cardiology Phone consult Dr. Carlyle Basques ID Dr. Irine [name] Urology Phone consult Dr. Zola [name] oncology    Procedures/Significant Events:  6/22 Renal Ultrasound;-Increased renal cortical echogenicity compatible with medical renal disease. -Moderate right hydronephrosis without visible shadowing calculus. Distal calculus is not excluded. 6/23 echocardiogram; Left Ventricle: LVEF= 60 to 65%.  -Grade II diastolic dysfunction (pseudonormalization).  6/24 CT renal stone study;-obstructing 4 mm stone at the RIGHT ureterovesicular junction with moderate to severe right hydroureteronephrosis. There are additional dependent contiguous stones within the distal right ureter that span 2 cm. -Mild LEFT hydroureteronephrosis has slightly progressed from prior exam. There is a 6 mm stone in the distal left ureter approximately 9 mm from the bladder insertion, not definitively cause for obstruction as the ureter is dilated distal to  this. -Bilateral nonobstructing renal calculi. -Hypodense lesion in the pancreatic tail measuring 2.7 cm, less well-defined currently than on previous exam in the absence of IV contrast. Further evaluation with MRI on an elective basis again recommended. -Progressive retroperitoneal adenopathy, suspicious for metastatic disease. Increased size of prior lymph nodes as well as new lymphadenopathy. -Lytic lesion involving the left pubic body highly suspicious for metastatic disease. No evidence of pathologic fracture. This may be related to known prostatic malignancy, however is unusual as prostate metastasis are typically sclerotic. -Small bilateral pleural effusions, right greater than left. 6/25 Bilateral 6 x 26 double-J ureteral stent placed     I have personally reviewed and interpreted all radiology studies and my findings are as above.  VENTILATOR SETTINGS:    Cultures 6/22 SARS coronavirus negative 6/22 urine positive Staph Simulans 6/22 blood NGTD 6/22 blood RIGHT AC NGTD    Antimicrobials: Anti-infectives (From admission, onward)   Start     Dose/Rate Stop   03/17/20 0600  cefTRIAXone (ROCEPHIN) 1 g in sodium chloride 0.9 % 100 mL IVPB     Discontinue     1 g 200 mL/hr over 30 Minutes     03/16/20 2300  ceFEPIme (MAXIPIME) 1 g in sodium chloride 0.9 %  100 mL IVPB        1 g 200 mL/hr over 30 Minutes 03/17/20 0007   03/16/20 2230  vancomycin (VANCOCIN) IVPB 1000 mg/200 mL premix  Status:  Discontinued        1,000 mg 200 mL/hr over 60 Minutes 03/16/20 2349       Devices    LINES / TUBES:  18 French Foley catheter 6/25>> Bilateral 6 x 26 double-J ureteral stent placed 6/25>>   Continuous Infusions: . sodium chloride 1,000 mL (03/19/20 0518)  . sodium chloride 50 mL/hr at 03/19/20 0300     Objective: Vitals:   03/21/20 0037 03/21/20 0532 03/21/20 0617 03/21/20 0759  BP: 107/66 (!) 156/88  130/82  Pulse: 70 67 72 61  Resp: '20 20  20  '$ Temp: 98.7 F  (37.1 C) 97.8 F (36.6 C)  97.8 F (36.6 C)  TempSrc: Oral Oral  Oral  SpO2: 96% 96%  99%  Weight:  98.3 kg    Height:        Intake/Output Summary (Last 24 hours) at 03/21/2020 1408 Last data filed at 03/21/2020 1151 Gross per 24 hour  Intake 720 ml  Output 1525 ml  Net -805 ml   Filed Weights   03/19/20 0432 03/20/20 0347 03/21/20 0532  Weight: 98.7 kg 98.1 kg 98.3 kg   Physical Exam:  General: A/O x4, no acute respiratory distress Eyes: negative scleral hemorrhage, negative anisocoria, negative icterus ENT: Negative Runny nose, negative gingival bleeding, Neck:  Negative scars, masses, torticollis, lymphadenopathy, JVD Lungs: Clear to auscultation bilaterally without wheezes or crackles Cardiovascular: Regular rate and rhythm without murmur gallop or rub normal S1 and S2 Abdomen: MORBIDLY OBESE, negative abdominal pain, nondistended, positive soft, bowel sounds, no rebound, no ascites, no appreciable mass Extremities: No significant cyanosis, clubbing, or edema bilateral lower extremities Skin: Negative rashes, lesions, ulcers Psychiatric:  Negative depression, negative anxiety, negative fatigue, negative mania  Central nervous system:  Cranial nerves II through XII intact, tongue/uvula midline, all extremities muscle strength 5/5, sensation intact throughout, negative dysarthria, negative expressive aphasia, negative receptive aphasia.  .     Data Reviewed: Care during the described time interval was provided by me .  I have reviewed this patient's available data, including medical history, events of note, physical examination, and all test results as part of my evaluation.  CBC: Recent Labs  Lab 03/16/20 1836 03/16/20 1859 03/17/20 0253 03/18/20 0428 03/19/20 0606 03/20/20 0703 03/21/20 0304  WBC 11.6*   < > 8.4 5.8 5.4 6.2 5.4  NEUTROABS 10.4*  --   --  4.2 3.9 5.2 3.7  HGB 11.3*   < > 9.9* 10.6* 10.6* 10.6* 9.7*  HCT 35.9*   < > 31.1* 35.1* 33.6* 33.1* 30.3*   MCV 100.3*   < > 101.0* 105.4* 101.2* 98.5 99.3  PLT 167   < > 144* 125* 129* 152 152   < > = values in this interval not displayed.   Basic Metabolic Panel: Recent Labs  Lab 03/17/20 0253 03/18/20 0428 03/19/20 0606 03/20/20 0703 03/21/20 0304  NA 140 138 138 141 139  K 4.7 4.7 4.3 4.9 4.1  CL 111 109 106 105 104  CO2 22 18* 21* 26 26  GLUCOSE 120* 105* 94 128* 101*  BUN 25* '23 16 21 '$ 26*  CREATININE 1.32* 1.45* 0.95 1.02 0.98  CALCIUM 8.6* 8.6* 8.6* 8.9 8.6*  MG  --  1.9 1.9 2.0 1.8  PHOS  --  3.2 2.9 3.7 3.4  GFR: Estimated Creatinine Clearance: 61.5 mL/min (by C-G formula based on SCr of 0.98 mg/dL). Liver Function Tests: Recent Labs  Lab 03/16/20 1836 03/18/20 0428 03/19/20 0606 03/20/20 0703 03/21/20 0304  AST 28 28 14* 14* 16  ALT '20 14 12 13 16  '$ ALKPHOS 66 63 56 57 59  BILITOT 0.6 0.9 0.8 0.5 0.3  PROT 6.7 6.0* 5.6* 5.9* 5.4*  ALBUMIN 3.8 3.1* 2.8* 2.9* 2.8*   No results for input(s): LIPASE, AMYLASE in the last 168 hours. No results for input(s): AMMONIA in the last 168 hours. Coagulation Profile: No results for input(s): INR, PROTIME in the last 168 hours. Cardiac Enzymes: Recent Labs  Lab 03/16/20 1836  CKTOTAL 94   BNP (last 3 results) No results for input(s): PROBNP in the last 8760 hours. HbA1C: No results for input(s): HGBA1C in the last 72 hours. CBG: No results for input(s): GLUCAP in the last 168 hours. Lipid Profile: No results for input(s): CHOL, HDL, LDLCALC, TRIG, CHOLHDL, LDLDIRECT in the last 72 hours. Thyroid Function Tests: No results for input(s): TSH, T4TOTAL, FREET4, T3FREE, THYROIDAB in the last 72 hours. Anemia Panel: No results for input(s): VITAMINB12, FOLATE, FERRITIN, TIBC, IRON, RETICCTPCT in the last 72 hours. Sepsis Labs: Recent Labs  Lab 03/16/20 1855 03/16/20 2049 03/17/20 0835 03/17/20 1059 03/18/20 0428 03/19/20 0606  PROCALCITON  --   --  1.71  --  1.06 0.54  LATICACIDVEN 3.3* 2.3* 1.1 1.9  --   --      Recent Results (from the past 240 hour(s))  SARS Coronavirus 2 by RT PCR (hospital order, performed in [place] hospital lab) Nasopharyngeal Nasopharyngeal Swab     Status: None   Collection Time: 03/16/20  6:38 PM   Specimen: Nasopharyngeal Swab  Result Value Ref Range Status   SARS Coronavirus 2 NEGATIVE NEGATIVE Final    Comment: (NOTE) SARS-CoV-2 target nucleic acids are NOT DETECTED.  The SARS-CoV-2 RNA is generally detectable in upper and lower respiratory specimens during the acute phase of infection. The lowest concentration of SARS-CoV-2 viral copies this assay can detect is 250 copies / mL. A negative result does not preclude SARS-CoV-2 infection and should not be used as the sole basis for treatment or other patient management decisions.  A negative result may occur with improper specimen collection / handling, submission of specimen other than nasopharyngeal swab, presence of viral mutation(s) within the areas targeted by this assay, and inadequate number of viral copies (<250 copies / mL). A negative result must be combined with clinical observations, patient history, and epidemiological information.  Fact Sheet for Patients:   StrictlyIdeas.no  Fact Sheet for Healthcare Providers: BankingDealers.co.za  This test is not yet approved or  cleared by the [place] FDA and has been authorized for detection and/or diagnosis of SARS-CoV-2 by FDA under an Emergency Use Authorization (EUA).  This EUA will remain in effect (meaning this test can be used) for the duration of the COVID-19 declaration under Section 564(b)(1) of the Act, 21 U.S.C. section 360bbb-3(b)(1), unless the authorization is terminated or revoked sooner.  Performed at [facility], [place] [address], [place], [place] [address]   Blood culture (routine x 2)     Status: None   Collection Time: 03/16/20  6:55 PM   Specimen: BLOOD  Result Value  Ref Range Status   Specimen Description BLOOD SITE NOT SPECIFIED  Final   Special Requests   Final    BOTTLES DRAWN AEROBIC AND ANAEROBIC Blood Culture  adequate volume   Culture   Final    NO GROWTH 5 DAYS Performed at [facility], [place] [address], [place], [place] [address]    Report Status 03/21/2020 FINAL  Final  Blood culture (routine x 2)     Status: None   Collection Time: 03/16/20  7:03 PM   Specimen: BLOOD  Result Value Ref Range Status   Specimen Description BLOOD RIGHT ANTECUBITAL  Final   Special Requests   Final    BOTTLES DRAWN AEROBIC AND ANAEROBIC Blood Culture adequate volume   Culture   Final    NO GROWTH 5 DAYS Performed at [facility], [place] [address], [place], [place] [address]    Report Status 03/21/2020 FINAL  Final  Culture, Urine     Status: Abnormal   Collection Time: 03/16/20 10:52 PM   Specimen: Urine, Random  Result Value Ref Range Status   Specimen Description URINE, RANDOM  Final   Special Requests   Final    NONE Performed at [facility], [place] [address], [place], [place] [address]    Culture >=100,000 COLONIES/mL STAPHYLOCOCCUS SIMULANS (A)  Final   Report Status 03/19/2020 FINAL  Final   Organism ID, Bacteria STAPHYLOCOCCUS SIMULANS (A)  Final      Susceptibility   Staphylococcus simulans - MIC*    CIPROFLOXACIN >=8 RESISTANT Resistant     GENTAMICIN <=0.5 SENSITIVE Sensitive     NITROFURANTOIN <=16 SENSITIVE Sensitive     OXACILLIN <=0.25 SENSITIVE Sensitive     TETRACYCLINE <=1 SENSITIVE Sensitive     VANCOMYCIN <=0.5 SENSITIVE Sensitive     TRIMETH/SULFA 40 SENSITIVE Sensitive     CLINDAMYCIN <=0.25 SENSITIVE Sensitive     RIFAMPIN <=0.5 SENSITIVE Sensitive     Inducible Clindamycin NEGATIVE Sensitive     * >=100,000 COLONIES/mL STAPHYLOCOCCUS SIMULANS         Radiology Studies: No results found.      Scheduled Meds: . amLODipine  5 mg Oral Daily  . amoxicillin  500 mg Oral Q8H  . carvedilol  3.125 mg  Oral BID WC  . Chlorhexidine Gluconate Cloth  6 each Topical Daily  . enoxaparin (LOVENOX) injection  40 mg Subcutaneous QHS  . levothyroxine  150 mcg Oral QAC breakfast  . loratadine  10 mg Oral Daily  . meloxicam  15 mg Oral Daily  . mirabegron ER  50 mg Oral Daily  . multivitamin with minerals  1 tablet Oral Once per day on Mon Thu   Continuous Infusions: . sodium chloride 1,000 mL (03/19/20 0518)  . sodium chloride 50 mL/hr at 03/19/20 0300     LOS: 5 days    Time spent:40 min    Marshal Schrecengost, Geraldo Docker, MD [facility] Pager [phone]  If 7PM-7AM, please contact night-coverage [email or web address] Password [facility] 03/21/2020, 2:08 PM

## 2020-03-21 NOTE — Progress Notes (Signed)
Inpatient Rehab Admissions Coordinator:   Pt.'s wife called and stated that Pt. Has spoken with his son and changed his mind regarding CIR admission. They would now like to to pursue CIR admission. Consult order re-entered per protocol.   Clemens Catholic, [place], [place] Admissions Coordinator  [phone] ([place]) [phone] (office)

## 2020-03-22 LAB — CBC WITH DIFFERENTIAL/PLATELET
Abs Immature Granulocytes: 0.03 10*3/uL (ref 0.00–0.07)
Basophils Absolute: 0 10*3/uL (ref 0.0–0.1)
Basophils Relative: 0 %
Eosinophils Absolute: 0.1 10*3/uL (ref 0.0–0.5)
Eosinophils Relative: 3 %
HCT: 30.4 % — ABNORMAL LOW (ref 39.0–52.0)
Hemoglobin: 9.9 g/dL — ABNORMAL LOW (ref 13.0–17.0)
Immature Granulocytes: 1 %
Lymphocytes Relative: 18 %
Lymphs Abs: 0.9 10*3/uL (ref 0.7–4.0)
MCH: 31.7 pg (ref 26.0–34.0)
MCHC: 32.6 g/dL (ref 30.0–36.0)
MCV: 97.4 fL (ref 80.0–100.0)
Monocytes Absolute: 0.5 10*3/uL (ref 0.1–1.0)
Monocytes Relative: 9 %
Neutro Abs: 3.6 10*3/uL (ref 1.7–7.7)
Neutrophils Relative %: 69 %
Platelets: 147 10*3/uL — ABNORMAL LOW (ref 150–400)
RBC: 3.12 MIL/uL — ABNORMAL LOW (ref 4.22–5.81)
RDW: 13.2 % (ref 11.5–15.5)
WBC: 5.1 10*3/uL (ref 4.0–10.5)
nRBC: 0 % (ref 0.0–0.2)

## 2020-03-22 LAB — COMPREHENSIVE METABOLIC PANEL
ALT: 18 U/L (ref 0–44)
AST: 19 U/L (ref 15–41)
Albumin: 2.9 g/dL — ABNORMAL LOW (ref 3.5–5.0)
Alkaline Phosphatase: 61 U/L (ref 38–126)
Anion gap: 10 (ref 5–15)
BUN: 24 mg/dL — ABNORMAL HIGH (ref 8–23)
CO2: 25 mmol/L (ref 22–32)
Calcium: 8.8 mg/dL — ABNORMAL LOW (ref 8.9–10.3)
Chloride: 103 mmol/L (ref 98–111)
Creatinine, Ser: 1.05 mg/dL (ref 0.61–1.24)
GFR calc Af Amer: >60 mL/min (ref >60)
GFR calc non Af Amer: >60 mL/min (ref >60)
Glucose, Bld: 102 mg/dL — ABNORMAL HIGH (ref 70–99)
Potassium: 4 mmol/L (ref 3.5–5.1)
Sodium: 138 mmol/L (ref 135–145)
Total Bilirubin: 0.4 mg/dL (ref 0.3–1.2)
Total Protein: 6.1 g/dL — ABNORMAL LOW (ref 6.5–8.1)

## 2020-03-22 LAB — MAGNESIUM: Magnesium: 1.9 mg/dL (ref 1.7–2.4)

## 2020-03-22 LAB — SARS CORONAVIRUS 2 (TAT 6-24 HRS): SARS Coronavirus 2: NEGATIVE

## 2020-03-22 LAB — PHOSPHORUS: Phosphorus: 3.3 mg/dL (ref 2.5–4.6)

## 2020-03-22 MED ORDER — DIPHENHYDRAMINE HCL 25 MG PO CAPS
25.0000 mg | ORAL_CAPSULE | Freq: Once | ORAL | Status: AC
  Administered 2020-03-22: 25 mg via ORAL
  Filled 2020-03-22: qty 1

## 2020-03-22 MED ORDER — TRAZODONE HCL 50 MG PO TABS
50.0000 mg | ORAL_TABLET | Freq: Every evening | ORAL | Status: DC | PRN
  Administered 2020-03-22: 50 mg via ORAL
  Filled 2020-03-22: qty 1

## 2020-03-22 NOTE — Care Management Important Message (Signed)
Important Message  Patient Details  Name: [name] [name] MRN: [id number] Date of Birth: Apr 13, 1932   Medicare Important Message Given:  Yes     Shelda Altes 03/22/2020, 9:36 AM

## 2020-03-22 NOTE — H&P (View-Only) (Signed)
3 Days Post-Op  Subjective: [name] [name] is doing well s/p bilateral ureteral stenting for distal ureteral stones with sepsis.  His culture has grown staph simulans.   He has no new voiding complaints and reduced pain.  ROS:  Review of Systems  Constitutional: Negative for chills and fever.  Neurological: Positive for weakness (generalized).    Anti-infectives: Anti-infectives (From admission, onward)   Start     Dose/Rate Route Frequency Ordered Stop   03/20/20 1230  amoxicillin (AMOXIL) capsule 500 mg     Discontinue     500 mg Oral Every 8 hours 03/20/20 1144     03/17/20 0600  cefTRIAXone (ROCEPHIN) 1 g in sodium chloride 0.9 % 100 mL IVPB  Status:  Discontinued        1 g 200 mL/hr over 30 Minutes Intravenous Every 24 hours 03/16/20 2349 03/20/20 1144   03/16/20 2300  ceFEPIme (MAXIPIME) 1 g in sodium chloride 0.9 % 100 mL IVPB        1 g 200 mL/hr over 30 Minutes Intravenous  Once 03/16/20 2228 03/17/20 0007   03/16/20 2230  vancomycin (VANCOCIN) IVPB 1000 mg/200 mL premix  Status:  Discontinued        1,000 mg 200 mL/hr over 60 Minutes Intravenous  Once 03/16/20 2228 03/16/20 2349      Current Facility-Administered Medications  Medication Dose Route Frequency Provider Last Rate Last Admin  . 0.9 %  sodium chloride infusion   Intravenous PRN Allie Bossier, MD 10 mL/hr at 03/19/20 0518 1,000 mL at 03/19/20 0518  . 0.9 %  sodium chloride infusion   Intravenous Continuous Allie Bossier, MD 50 mL/hr at 03/19/20 0300 Rate Verify at 03/19/20 0300  . acetaminophen (TYLENOL) tablet 650 mg  650 mg Oral Q6H PRN Etta Quill, DO       Or  . acetaminophen (TYLENOL) suppository 650 mg  650 mg Rectal Q6H PRN Etta Quill, DO      . albuterol (PROVENTIL) (2.5 MG/3ML) 0.083% nebulizer solution 2.5 mg  2.5 mg Inhalation Q6H PRN Etta Quill, DO      . amLODipine (NORVASC) tablet 5 mg  5 mg Oral Daily Allie Bossier, MD   5 mg at 03/21/20 0915  . amoxicillin (AMOXIL) capsule 500 mg   500 mg Oral Q8H Allie Bossier, MD   500 mg at 03/22/20 0960  . carvedilol (COREG) tablet 3.125 mg  3.125 mg Oral BID WC Allie Bossier, MD   3.125 mg at 03/21/20 1727  . Chlorhexidine Gluconate Cloth 2 % PADS 6 each  6 each Topical Daily Allie Bossier, MD   6 each at 03/20/20 1233  . enoxaparin (LOVENOX) injection 40 mg  40 mg Subcutaneous QHS Jennette Kettle M, DO   40 mg at 03/21/20 2120  . fluticasone (FLONASE) 50 MCG/ACT nasal spray 2 spray  2 spray Each Nare Daily PRN Etta Quill, DO      . levothyroxine (SYNTHROID) tablet 150 mcg  150 mcg Oral QAC breakfast Etta Quill, DO   150 mcg at 03/22/20 4540  . loratadine (CLARITIN) tablet 10 mg  10 mg Oral Daily Jennette Kettle M, DO   10 mg at 03/21/20 0915  . meloxicam (MOBIC) tablet 15 mg  15 mg Oral Daily Jennette Kettle M, DO   15 mg at 03/21/20 1150  . mirabegron ER (MYRBETRIQ) tablet 50 mg  50 mg Oral Daily Alcario Drought, Jared M, DO   50 mg at  03/21/20 1150  . multivitamin with minerals tablet 1 tablet  1 tablet Oral Once per day on Mon Thu Gardner, Jared M, DO   1 tablet at 03/18/20 0848  . ondansetron (ZOFRAN) tablet 4 mg  4 mg Oral Q6H PRN Etta Quill, DO       Or  . ondansetron [facility]) injection 4 mg  4 mg Intravenous Q6H PRN Etta Quill, DO   4 mg at 03/19/20 1736  . oxyCODONE-acetaminophen (PERCOCET/ROXICET) 5-325 MG per tablet 1 tablet  1 tablet Oral Q6H PRN Etta Quill, DO   1 tablet at 03/21/20 0920  . polyethylene glycol (MIRALAX / GLYCOLAX) packet 17 g  17 g Oral Daily PRN Vernelle Emerald, MD   17 g at 03/20/20 0955     Objective: Vital signs in last 24 hours: Temp:  [97.7 F (36.5 C)-100.4 F (38 C)] 99.3 F (37.4 C) (06/28 0409) Pulse Rate:  [61-88] 73 (06/28 0409) Resp:  [15-20] 15 (06/28 0409) BP: (121-151)/(63-104) 151/68 (06/28 0409) SpO2:  [94 %-100 %] 94 % (06/28 0409) Weight:  [97 kg] 97 kg (06/28 0409)  Intake/Output from previous day: 06/27 0701 - 06/28 0700 In: 960 [P.O.:960] Out:  1625 [Urine:1625] Intake/Output this shift: No intake/output data recorded.   Physical Exam Vitals reviewed.  Constitutional:      Appearance: Normal appearance.  Abdominal:     Palpations: Abdomen is soft.     Tenderness: There is abdominal tenderness (mild RUQ).  Neurological:     Mental Status: He is alert.     Lab Results:  Recent Labs    03/21/20 0304 03/22/20 0522  WBC 5.4 5.1  HGB 9.7* 9.9*  HCT 30.3* 30.4*  PLT 152 147*   BMET Recent Labs    03/21/20 0304 03/22/20 0522  NA 139 138  K 4.1 4.0  CL 104 103  CO2 26 25  GLUCOSE 101* 102*  BUN 26* 24*  CREATININE 0.98 1.05  CALCIUM 8.6* 8.8*   PT/INR No results for input(s): LABPROT, INR in the last 72 hours. ABG No results for input(s): PHART, HCO3 in the last 72 hours.  Invalid input(s): PCO2, PO2  Studies/Results: No results found.   Assessment and Plan: Bilateral distal stones with right obstruction and sepsis doing well s/p bilateral stenting.   I will work on getting him scheduled for ureteroscopy in the next couple of weeks.       LOS: 6 days    [name] 03/22/2020 [phone] ID: [name] [name], male   DOB: 07/28/1932, 86 y.o.   MRN: [id number]

## 2020-03-22 NOTE — Progress Notes (Signed)
Physical Therapy Treatment Patient Details Name: [name] [name] MRN: [id number] DOB: April 19, 1932 Today's Date: 03/22/2020    History of Present Illness 86 yo male admitted to ED on 6/22 with fall with subsequent R forehead and R knee abrasions, UTI sepsis. EKG revealed V1 and V2 abnormalities with elevated troponin, Cardiology state in setting of demand ischemia due to sepsis. PMH includes OA, prostate cancer, asthma, urinary tract obstruction, HTN, OA.    PT Comments    Pt admitted with above diagnosis. Pt was able to ambulate with RW with good safety overall. Wife aware that she may need to stay close by pt initially until he gets a little stronger. Pt and wife state they are ready to go home.   Pt currently with functional limitations due to balance and endurance deficits. Pt will benefit from skilled PT to increase their independence and safety with mobility to allow discharge to the venue listed below.     Follow Up Recommendations  Home health PT;Supervision/Assistance - 24 hour     Equipment Recommendations  None recommended by PT    Recommendations for Other Services       Precautions / Restrictions Precautions Precautions: Fall Restrictions Weight Bearing Restrictions: No    Mobility  Bed Mobility Overal bed mobility: Needs Assistance Bed Mobility: Sit to Supine;Supine to Sit     Supine to sit: HOB elevated;Min guard Sit to supine: HOB elevated;Min assist   General bed mobility comments: Help with getting legs into bed and straightening trunk in bed  Transfers Overall transfer level: Needs assistance Equipment used: Rolling walker (2 wheeled) Transfers: Sit to/from Stand Sit to Stand: Min assist;Min guard         General transfer comment: Min assist for rise and steady, verbal cuing for hand placement when rising.  Ambulation/Gait Ambulation/Gait assistance: Min guard Gait Distance (Feet): 125 Feet Assistive device: Rolling walker (2 wheeled) Gait  Pattern/deviations: Step-through pattern;Decreased stride length;Trunk flexed;Wide base of support Gait velocity: decr Gait velocity interpretation: 1.31 - 2.62 ft/sec, indicative of limited community ambulator General Gait Details: Min guard assist to steady, guide pt and RW especially with directional changes. Verbal cuing for upright posture, placement in RW, RW navigation during turning.   Stairs             Wheelchair Mobility    Modified Rankin (Stroke Patients Only)       Balance Overall balance assessment: Needs assistance;History of Falls Sitting-balance support: No upper extremity supported;Feet supported Sitting balance-Leahy Scale: Fair     Standing balance support: Bilateral upper extremity supported;During functional activity Standing balance-Leahy Scale: Poor Standing balance comment: reliant on RW statically and  external assist dynamically                            Cognition Arousal/Alertness: Awake/alert Behavior During Therapy: WFL for tasks assessed/performed Overall Cognitive Status: Within Functional Limits for tasks assessed Area of Impairment: Problem solving;Following commands                       Following Commands: Follows multi-step commands inconsistently              Exercises      General Comments General comments (skin integrity, edema, etc.): VSS      Pertinent Vitals/Pain Pain Assessment: No/denies pain    Home Living  Prior Function            PT Goals (current goals can now be found in the care plan section) Acute Rehab PT Goals Patient Stated Goal: get stronger Progress towards PT goals: Progressing toward goals    Frequency    Min 3X/week      PT Plan Discharge plan needs to be updated    Co-evaluation              AM-PAC PT "6 Clicks" Mobility   Outcome Measure  Help needed turning from your back to your side while in a flat bed without  using bedrails?: A Little Help needed moving from lying on your back to sitting on the side of a flat bed without using bedrails?: A Little Help needed moving to and from a bed to a chair (including a wheelchair)?: A Little Help needed standing up from a chair using your arms (e.g., wheelchair or bedside chair)?: A Little Help needed to walk in hospital room?: A Little Help needed climbing 3-5 steps with a railing? : A Lot 6 Click Score: 17    End of Session Equipment Utilized During Treatment: Gait belt Activity Tolerance: Patient tolerated treatment well Patient left: with call bell/phone within reach;with family/visitor present;in bed Nurse Communication: Mobility status PT Visit Diagnosis: Other abnormalities of gait and mobility (R26.89);Muscle weakness (generalized) (M62.81);Difficulty in walking, not elsewhere classified (R26.2)     Time: 3212-2482 PT Time Calculation (min) (ACUTE ONLY): 17 min  Charges:  $Gait Training: 8-22 mins                     [name] [name] W,PT [place] Pager:  [phone]  Office:  [place] 03/22/2020, 1:36 PM

## 2020-03-22 NOTE — Progress Notes (Signed)
Occupational Therapy Treatment Patient Details Name: [name] [name] MRN: [id number] DOB: September 11, 1932 Today's Date: 03/22/2020    History of present illness 86 yo male admitted to ED on 6/22 with fall with subsequent R forehead and R knee abrasions, UTI sepsis. EKG revealed V1 and V2 abnormalities with elevated troponin, Cardiology state in setting of demand ischemia due to sepsis. PMH includes OA, prostate cancer, asthma, urinary tract obstruction, HTN, OA.   OT comments  Pt making steady progress towards OT goals this session. Session focus on functional mobility with RW as precursor to higher level BADLs. Pt requested to stay connected to primo fit, therefore mobility limited to room. Overall, pt requires MIN guard for functional mobility with RW. Extensive education provided on ECS related to BADLs as pt reports difficulty completing bathing, dressing, showering and IADLs. Education provided on use of AE with pt verbalizing understanding. Feel pt appropriate for CIR to increase activity tolerance and facilitate increased education on ECS, will follow.    Follow Up Recommendations  CIR    Equipment Recommendations  Other (comment) (defer to next venue)    Recommendations for Other Services      Precautions / Restrictions Precautions Precautions: Fall Restrictions Weight Bearing Restrictions: No       Mobility Bed Mobility Overal bed mobility: Needs Assistance Bed Mobility: Supine to Sit     Supine to sit: HOB elevated;Min guard Sit to supine: HOB elevated;Min assist   General bed mobility comments: min guard for safety  Transfers Overall transfer level: Needs assistance Equipment used: Rolling walker (2 wheeled) Transfers: Sit to/from Stand Sit to Stand: Min guard         General transfer comment: pt request to complete sit<>stands without assist; min guard for safety x3, cues for hand placement    Balance Overall balance assessment: Needs assistance;History of  Falls Sitting-balance support: No upper extremity supported;Feet supported Sitting balance-Leahy Scale: Fair     Standing balance support: Bilateral upper extremity supported;During functional activity Standing balance-Leahy Scale: Poor Standing balance comment: reliant on BUE support                           ADL either performed or assessed with clinical judgement   ADL Overall ADL's : Needs assistance/impaired       Grooming Details (indicate cue type and reason): pt reports difficulty with brushing hair; education on using extended comb               Lower Body Dressing Details (indicate cue type and reason): education on using reacher for LB dressing; pt reports using sock aid at baseline Toilet Transfer: Min guard;Ambulation;RW Toilet Transfer Details (indicate cue type and reason): simulated via functional mobility with RW   Toileting - Clothing Manipulation Details (indicate cue type and reason): education on using compensatory methods for pericare related to [facility]     Functional mobility during ADLs: Min guard;Rolling walker General ADL Comments: pt continues to present with decreased activity tolerance and fatigue impacting pts ability to complete BADLs     Vision       Perception     Praxis      Cognition Arousal/Alertness: Awake/alert Behavior During Therapy: WFL for tasks assessed/performed Overall Cognitive Status: Within Functional Limits for tasks assessed Area of Impairment: Problem solving;Following commands                       Following Commands: Follows multi-step commands inconsistently  Exercises General Exercises - Lower Extremity Mini-Sqauts: AROM;Both;10 reps;Standing (min guard while holding on to RW)   Shoulder Instructions       General Comments pts wife present during session. education provided on ECS related to BADLs    Pertinent Vitals/ Pain       Pain Assessment: No/denies  pain  Home Living                                          Prior Functioning/Environment              Frequency  Min 2X/week        Progress Toward Goals  OT Goals(current goals can now be found in the care plan section)  Progress towards OT goals: Progressing toward goals  Acute Rehab OT Goals Patient Stated Goal: get stronger OT Goal Formulation: With patient Time For Goal Achievement: 04/02/20 Potential to Achieve Goals: Good  Plan Discharge plan remains appropriate;Frequency remains appropriate    Co-evaluation                 AM-PAC OT "6 Clicks" Daily Activity     Outcome Measure   Help from another person eating meals?: None Help from another person taking care of personal grooming?: A Little Help from another person toileting, which includes using toliet, bedpan, or urinal?: A Lot Help from another person bathing (including washing, rinsing, drying)?: A Lot Help from another person to put on and taking off regular upper body clothing?: A Little Help from another person to put on and taking off regular lower body clothing?: A Lot 6 Click Score: 16    End of Session Equipment Utilized During Treatment: Rolling walker  OT Visit Diagnosis: Unsteadiness on feet (R26.81);Muscle weakness (generalized) (M62.81);History of falling (Z91.81)   Activity Tolerance Patient tolerated treatment well   Patient Left in chair;with call bell/phone within reach;with chair alarm set   Nurse Communication Mobility status        Time: 0623-7628 OT Time Calculation (min): 27 min  Charges: OT General Charges $OT Visit: 1 Visit OT Treatments $Self Care/Home Management : 23-37 mins  Lanier Clam., COTA/L Acute Rehabilitation Services [phone]   [name] [name] 03/22/2020, 2:54 PM

## 2020-03-22 NOTE — Progress Notes (Signed)
PROGRESS NOTE    [name]  WCH:[id number] DOB: Jun 30, 1932 DOA: 03/16/2020 PCP: Maury Dus, MD     Brief Narrative:  86 y.o. WM (former [employer]) PMHx HTN, RBBB, prostate CA. nephrolithiasis, Hx urinary tract obstruction, hypothyroidism, CKD stage IIIb  Pt presents to ED with c/o generalized weakness.  Patient states that he was transferring from his chair and felt weak and fell face forward. He states that he is wife called immediately. EMS noticed that he was on the floor and was having some chest pain. His EKG in the field showed STEMI in V1 and V2. Patient states that he has not been on it for that long. He states that he was given aspirin 324 mg by EMS. He has no chest pain currently. Patient is not on any blood thinners.   ED Course: Cards saw pt: not a STEMI.  Found to have Tm 101.  Found to have UTI.  WBC 11.6.  Lactate 3.3.  Creat 1.6, slightly up from baseline of 1.35 earlier this year.  Trops were 43 and 124.  Cards feels that this is demand ischemia, pt remains CP free.    Subjective: 6/27 afebrile overnight.  A/O x4, negative CP, negative S OB, negative N/V.  Negative pyuria.  Foley removed, no feeling of retention   Assessment & Plan: Covid vaccination; positive vaccination     Principal Problem:   Sepsis secondary to UTI [facility]) Active Problems:   Prostate cancer ([place])   Demand ischemia ([place])   CKD (chronic kidney disease), stage IIIb   Hydronephrosis of right kidney   Sepsis UTI/severe sepsis/ positive Staph Simulans -On admission met criteria for sepsis temp> 38 C, HR> 90 bpm, RR> 20; lactic acid> 2 -Elevated procalcitonin 1.71, will complete 5-day course antibiotics. -6/24 urine cultures positive for Staph Simulans -Blood culture still NGTD -Continue current antibiotic until susceptibilities result.  Spoke with Dr. Baxter Flattery ID and she states if pansensitive can change over to amoxicillin or Keflex to complete treatment. -6/26 staph  is pansensitive except to Cipro start Amoxicillin 500 mg TID.  Since patient has stents would continue antibiotics until seen by urology at follow-up unless they have different recommendation. -6/28 DC Normal Saline   Increasing PSA/RIGHT & LEFT Hydronephrosis with nephrolithiasis -Received a call from Dr. [name] Urology, who has treated patient in the past for his prostate cancer and urinary tract obstruction.  Concerned that with an increasing PSA and lymphadenopathy (unable to see in epic), that patient may require cystoscopy and stent or percutaneous drain.   -Have ordered CT abdomen pelvis W0 contrast stone protocol as requested. -Dr. Jeffie Pollock will see patient in a.m. await recommendations -6/25 s/p of bilateral ureteral stent see procedures below -Per urology As long as he is doing well tomorrow, can remove Foley catheter.  He will eventually need bilateral ureteroscopy with laser lithotripsy and ureteral stent placement/exchange -6/26 Foley removed -renn   Lytic lesion LEFT pubic body -6/25 discussed case with Dr. Zola Button oncology who is aware of findings, and attributes them to worsening prostate cancer.  Will address as outpatient.  Not a candidate for aggressive care. -6/28 schedule follow-up Dr. 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 Range 03/16/2020 18:36 03/16/2020 20:49 03/17/2020 01:27 03/17/2020 02:53  Troponin I (High Sensitivity) Latest Ref Range: <18  ng/L 43 (H) 124 (HH) 152 (HH) 143 (HH)    Prostate cancer -Currently not on any treatment.  CKD stage IIIb (baseline Cr 1.35) Recent Labs  Lab 03/18/20 0428 03/19/20 0606 03/20/20 0703 03/21/20 0304 03/22/20 0522  CREATININE 1.45* 0.95 1.02 0.98 1.05  -Better than baseline. -Avoid all nephrotoxic medication    Goals of care -6/24 PT/OT consult; demand ischemia/chronic diastolic CHF evaluate patient for CIR vs SNF -6/27 patient and family only interested in being discharged to [facility] or home health.    DVT prophylaxis: Lovenox Code Status: Full Family Communication: 6/25 wife and son at bedside for discussion of plan of care Status is: Inpatient    Dispo: The patient is from: Home              Anticipated d/c is to: Home              Anticipated d/c date is: 6/29              Patient currently unstable      Consultants:  Cardiology Phone consult Dr. Carlyle Basques ID Dr. [name] Urology Phone consult Dr. Zola Button oncology    Procedures/Significant Events:  6/22 Renal Ultrasound;-Increased renal cortical echogenicity compatible with medical renal disease. -Moderate right hydronephrosis without visible shadowing calculus. Distal calculus is not excluded. 6/23 echocardiogram; Left Ventricle: LVEF= 60 to 65%.  -Grade II diastolic dysfunction (pseudonormalization).  6/24 CT renal stone study;-obstructing 4 mm stone at the RIGHT ureterovesicular junction with moderate to severe right hydroureteronephrosis. There are additional dependent contiguous stones within the distal right ureter that span 2 cm. -Mild LEFT hydroureteronephrosis has slightly progressed from prior exam. There is a 6 mm stone in the distal left ureter approximately 9 mm from the bladder insertion, not definitively cause for obstruction as the ureter is dilated distal to this. -Bilateral nonobstructing renal calculi. -Hypodense lesion in the pancreatic tail measuring 2.7 cm, less  well-defined currently than on previous exam in the absence of IV contrast. Further evaluation with MRI on an elective basis again recommended. -Progressive retroperitoneal adenopathy, suspicious for metastatic disease. Increased size of prior lymph nodes as well as new lymphadenopathy. -Lytic lesion involving the left pubic body highly suspicious for metastatic disease. No evidence of pathologic fracture. This may be related to known prostatic malignancy, however is unusual as prostate metastasis are typically sclerotic. -Small bilateral pleural effusions, right greater than left. 6/25 Bilateral 6 x 26 double-J ureteral stent placed     I have personally reviewed and interpreted all radiology studies and my findings are as above.  VENTILATOR SETTINGS:    Cultures 6/22 SARS coronavirus negative 6/22 urine positive Staph Simulans 6/22 blood negative final 6/22 blood RIGHT AC negative final    Antimicrobials: Anti-infectives (From admission, onward)   Start     Ordered Stop   03/20/20 1230  amoxicillin (AMOXIL) capsule 500 mg     Discontinue     03/20/20 1144     03/17/20 0600  cefTRIAXone (ROCEPHIN) 1 g in sodium chloride 0.9 % 100 mL IVPB  Status:  Discontinued        03/16/20 2349 03/20/20 1144   03/16/20 2300  ceFEPIme (  MAXIPIME) 1 g in sodium chloride 0.9 % 100 mL IVPB        03/16/20 2228 03/17/20 0007   03/16/20 2230  vancomycin (VANCOCIN) IVPB 1000 mg/200 mL premix  Status:  Discontinued        03/16/20 2228 03/16/20 2349       Devices    LINES / TUBES:  18 French Foley catheter 6/25>> Bilateral 6 x 26 double-J ureteral stent placed 6/25>>   Continuous Infusions:  sodium chloride 1,000 mL (03/19/20 0518)   sodium chloride 50 mL/hr at 03/19/20 0300     Objective: Vitals:   03/22/20 0409 03/22/20 0758 03/22/20 0919 03/22/20 1120  BP: (!) 151/68 (!) 144/85 (!) 113/52 (!) 109/57  Pulse: 73 73 67 67  Resp: _0 Temp: 99.3 F (37.4 C) 98.6 F (37  C)  98.6 F (37 C)  TempSrc: Oral Oral  Oral  SpO2: 94% 95% 100% 96%  Weight: 97 kg     Height:        Intake/Output Summary (Last 24 hours) at 03/22/2020 1215 Last data filed at 03/22/2020 0919 Gross per 24 hour  Intake 840 ml  Output 1875 ml  Net -1035 ml   Filed Weights   03/20/20 0347 03/21/20 0532 03/22/20 0409  Weight: 98.1 kg 98.3 kg 97 kg   Physical Exam:  General: A/O x4, no acute respiratory distress, sitting in chair comfortably Eyes: negative scleral hemorrhage, negative anisocoria, negative icterus ENT: Negative Runny nose, negative gingival bleeding, Neck:  Negative scars, masses, torticollis, lymphadenopathy, JVD Lungs: Clear to auscultation bilaterally without wheezes or crackles Cardiovascular: Regular rate and rhythm without murmur gallop or rub normal S1 and S2 Abdomen: negative abdominal pain, nondistended, positive soft, bowel sounds, no rebound, no ascites, no appreciable mass Extremities: No significant cyanosis, clubbing, or edema bilateral lower extremities Skin: Negative rashes, lesions, ulcers Psychiatric:  Negative depression, negative anxiety, negative fatigue, negative mania  Central nervous system:  Cranial nerves II through XII intact, tongue/uvula midline, all extremities muscle strength 5/5, sensation intact throughout, negative dysarthria, negative expressive aphasia, negative receptive aphasia.  .     Data Reviewed: Care during the described time interval was provided by me .  I have reviewed this patient's available data, including medical history, events of note, physical examination, and all test results as part of my evaluation.  CBC: Recent Labs  Lab 03/18/20 0428 03/19/20 0606 03/20/20 0703 03/21/20 0304 03/22/20 0522  WBC 5.8 5.4 6.2 5.4 5.1  NEUTROABS 4.2 3.9 5.2 3.7 3.6  HGB 10.6* 10.6* 10.6* 9.7* 9.9*  HCT 35.1* 33.6* 33.1* 30.3* 30.4*  MCV 105.4* 101.2* 98.5 99.3 97.4  PLT 125* 129* 152 152 888*   Basic Metabolic  Panel: Recent Labs  Lab 03/18/20 0428 03/19/20 0606 03/20/20 0703 03/21/20 0304 03/22/20 0522  NA 138 138 141 139 138  K 4.7 4.3 4.9 4.1 4.0  CL 109 106 105 104 103  CO2 18* 21* _1 GLUCOSE 105* 94 128* 101* 102*  BUN _2 26* 24*  CREATININE 1.45* 0.95 1.02 0.98 1.05  CALCIUM 8.6* 8.6* 8.9 8.6* 8.8*  MG 1.9 1.9 2.0 1.8 1.9  PHOS 3.2 2.9 3.7 3.4 3.3   GFR: Estimated Creatinine Clearance: 57 mL/min (by C-G formula based on SCr of 1.05 mg/dL). Liver Function Tests: Recent Labs  Lab 03/18/20 0428 03/19/20 0606 03/20/20 0703 03/21/20 0304 03/22/20 0522  AST 28 14* 14* 16 19  ALT _3 16  18  ALKPHOS 63 56 57 59 61  BILITOT 0.9 0.8 0.5 0.3 0.4  PROT 6.0* 5.6* 5.9* 5.4* 6.1*  ALBUMIN 3.1* 2.8* 2.9* 2.8* 2.9*   No results for input(s): LIPASE, AMYLASE in the last 168 hours. No results for input(s): AMMONIA in the last 168 hours. Coagulation Profile: No results for input(s): INR, PROTIME in the last 168 hours. Cardiac Enzymes: Recent Labs  Lab 03/16/20 1836  CKTOTAL 94   BNP (last 3 results) No results for input(s): PROBNP in the last 8760 hours. HbA1C: No results for input(s): HGBA1C in the last 72 hours. CBG: No results for input(s): GLUCAP in the last 168 hours. Lipid Profile: No results for input(s): CHOL, HDL, LDLCALC, TRIG, CHOLHDL, LDLDIRECT in the last 72 hours. Thyroid Function Tests: No results for input(s): TSH, T4TOTAL, FREET4, T3FREE, THYROIDAB in the last 72 hours. Anemia Panel: No results for input(s): VITAMINB12, FOLATE, FERRITIN, TIBC, IRON, RETICCTPCT in the last 72 hours. Sepsis Labs: Recent Labs  Lab 03/16/20 1855 03/16/20 2049 03/17/20 0835 03/17/20 1059 03/18/20 0428 03/19/20 0606  PROCALCITON  --   --  1.71  --  1.06 0.54  LATICACIDVEN 3.3* 2.3* 1.1 1.9  --   --     Recent Results (from the past 240 hour(s))  SARS Coronavirus 2 by RT PCR (hospital order, performed in [facility] hospital lab) Nasopharyngeal  Nasopharyngeal Swab     Status: None   Collection Time: 03/16/20  6:38 PM   Specimen: Nasopharyngeal Swab  Result Value Ref Range Status   SARS Coronavirus 2 NEGATIVE NEGATIVE Final    Comment: (NOTE) SARS-CoV-2 target nucleic acids are NOT DETECTED.  The SARS-CoV-2 RNA is generally detectable in upper and lower respiratory specimens during the acute phase of infection. The lowest concentration of SARS-CoV-2 viral copies this assay can detect is 250 copies / mL. A negative result does not preclude SARS-CoV-2 infection and should not be used as the sole basis for treatment or other patient management decisions.  A negative result may occur with improper specimen collection / handling, submission of specimen other than nasopharyngeal swab, presence of viral mutation(s) within the areas targeted by this assay, and inadequate number of viral copies (<250 copies / mL). A negative result must be combined with clinical observations, patient history, and epidemiological information.  Fact Sheet for Patients:   StrictlyIdeas.no  Fact Sheet for Healthcare Providers: BankingDealers.co.za  This test is not yet approved or  cleared by the [place] FDA and has been authorized for detection and/or diagnosis of SARS-CoV-2 by FDA under an Emergency Use Authorization (EUA).  This EUA will remain in effect (meaning this test can be used) for the duration of the COVID-19 declaration under Section 564(b)(1) of the Act, 21 U.S.C. section 360bbb-3(b)(1), unless the authorization is terminated or revoked sooner.  Performed at [facility], [place] [address], [place], [place] [address]   Blood culture (routine x 2)     Status: None   Collection Time: 03/16/20  6:55 PM   Specimen: BLOOD  Result Value Ref Range Status   Specimen Description BLOOD SITE NOT SPECIFIED  Final   Special Requests   Final    BOTTLES DRAWN AEROBIC AND ANAEROBIC Blood  Culture adequate volume   Culture   Final    NO GROWTH 5 DAYS Performed at [facility], [address] [place], [place] [address]    Report Status 03/21/2020 FINAL  Final  Blood culture (routine x 2)  Status: None   Collection Time: 03/16/20  7:03 PM   Specimen: BLOOD  Result Value Ref Range Status   Specimen Description BLOOD RIGHT ANTECUBITAL  Final   Special Requests   Final    BOTTLES DRAWN AEROBIC AND ANAEROBIC Blood Culture adequate volume   Culture   Final    NO GROWTH 5 DAYS Performed at [facility], [address] [place], [place] [address]    Report Status 03/21/2020 FINAL  Final  Culture, Urine     Status: Abnormal   Collection Time: 03/16/20 10:52 PM   Specimen: Urine, Random  Result Value Ref Range Status   Specimen Description URINE, RANDOM  Final   Special Requests   Final    NONE Performed at [facility], [place] [address], [place], [place] [address]    Culture >=100,000 COLONIES/mL STAPHYLOCOCCUS SIMULANS (A)  Final   Report Status 03/19/2020 FINAL  Final   Organism ID, Bacteria STAPHYLOCOCCUS SIMULANS (A)  Final      Susceptibility   Staphylococcus simulans - MIC*    CIPROFLOXACIN >=8 RESISTANT Resistant     GENTAMICIN <=0.5 SENSITIVE Sensitive     NITROFURANTOIN <=16 SENSITIVE Sensitive     OXACILLIN <=0.25 SENSITIVE Sensitive     TETRACYCLINE <=1 SENSITIVE Sensitive     VANCOMYCIN <=0.5 SENSITIVE Sensitive     TRIMETH/SULFA 40 SENSITIVE Sensitive     CLINDAMYCIN <=0.25 SENSITIVE Sensitive     RIFAMPIN <=0.5 SENSITIVE Sensitive     Inducible Clindamycin NEGATIVE Sensitive     * >=100,000 COLONIES/mL STAPHYLOCOCCUS SIMULANS  SARS CORONAVIRUS 2 (TAT 6-24 HRS) Nasopharyngeal Nasopharyngeal Swab     Status: None   Collection Time: 03/22/20  1:53 AM   Specimen: Nasopharyngeal Swab  Result Value Ref Range Status   SARS Coronavirus 2 NEGATIVE NEGATIVE Final    Comment: (NOTE) SARS-CoV-2 target nucleic acids are NOT DETECTED.  The  SARS-CoV-2 RNA is generally detectable in upper and lower respiratory specimens during the acute phase of infection. Negative results do not preclude SARS-CoV-2 infection, do not rule out co-infections with other pathogens, and should not be used as the sole basis for treatment or other patient management decisions. Negative results must be combined with clinical observations, patient history, and epidemiological information. The expected result is Negative.  Fact Sheet for Patients: SugarRoll.be  Fact Sheet for Healthcare Providers: https://www.Alee Katen-mathews.com/  This test is not yet approved or cleared by the [place] FDA and  has been authorized for detection and/or diagnosis of SARS-CoV-2 by FDA under an Emergency Use Authorization (EUA). This EUA will remain  in effect (meaning this test can be used) for the duration of the COVID-19 declaration under Se ction 564(b)(1) of the Act, 21 U.S.C. section 360bbb-3(b)(1), unless the authorization is terminated or revoked sooner.  Performed at [facility], [place] [address] [place], [place] [address]          Radiology Studies: No results found.      Scheduled Meds:  amLODipine  5 mg Oral Daily   amoxicillin  500 mg Oral Q8H   carvedilol  3.125 mg Oral BID WC   Chlorhexidine Gluconate Cloth  6 each Topical Daily   enoxaparin (LOVENOX) injection  40 mg Subcutaneous QHS   levothyroxine  150 mcg Oral QAC breakfast   loratadine  10 mg Oral Daily   meloxicam  15 mg Oral Daily   mirabegron ER  50 mg Oral Daily   multivitamin with minerals  1  tablet Oral Once per day on Mon Thu   Continuous Infusions:  sodium chloride 1,000 mL (03/19/20 0518)   sodium chloride 50 mL/hr at 03/19/20 0300     LOS: 6 days    Time spent:40 min    Joshue Badal, Geraldo Docker, MD [facility] Pager [phone]  If 7PM-7AM, please contact night-coverage [email or web address] Password  [facility] 03/22/2020, 12:15 PM

## 2020-03-22 NOTE — Progress Notes (Signed)
3 Days Post-Op  Subjective: [name] [name] is doing well s/p bilateral ureteral stenting for distal ureteral stones with sepsis.  His culture has grown staph simulans.   He has no new voiding complaints and reduced pain.  ROS:  Review of Systems  Constitutional: Negative for chills and fever.  Neurological: Positive for weakness (generalized).    Anti-infectives: Anti-infectives (From admission, onward)   Start     Dose/Rate Route Frequency Ordered Stop   03/20/20 1230  amoxicillin (AMOXIL) capsule 500 mg     Discontinue     500 mg Oral Every 8 hours 03/20/20 1144     03/17/20 0600  cefTRIAXone (ROCEPHIN) 1 g in sodium chloride 0.9 % 100 mL IVPB  Status:  Discontinued        1 g 200 mL/hr over 30 Minutes Intravenous Every 24 hours 03/16/20 2349 03/20/20 1144   03/16/20 2300  ceFEPIme (MAXIPIME) 1 g in sodium chloride 0.9 % 100 mL IVPB        1 g 200 mL/hr over 30 Minutes Intravenous  Once 03/16/20 2228 03/17/20 0007   03/16/20 2230  vancomycin (VANCOCIN) IVPB 1000 mg/200 mL premix  Status:  Discontinued        1,000 mg 200 mL/hr over 60 Minutes Intravenous  Once 03/16/20 2228 03/16/20 2349      Current Facility-Administered Medications  Medication Dose Route Frequency Provider Last Rate Last Admin  . 0.9 %  sodium chloride infusion   Intravenous PRN Allie Bossier, MD 10 mL/hr at 03/19/20 0518 1,000 mL at 03/19/20 0518  . 0.9 %  sodium chloride infusion   Intravenous Continuous Allie Bossier, MD 50 mL/hr at 03/19/20 0300 Rate Verify at 03/19/20 0300  . acetaminophen (TYLENOL) tablet 650 mg  650 mg Oral Q6H PRN Etta Quill, DO       Or  . acetaminophen (TYLENOL) suppository 650 mg  650 mg Rectal Q6H PRN Etta Quill, DO      . albuterol (PROVENTIL) (2.5 MG/3ML) 0.083% nebulizer solution 2.5 mg  2.5 mg Inhalation Q6H PRN Etta Quill, DO      . amLODipine (NORVASC) tablet 5 mg  5 mg Oral Daily Allie Bossier, MD   5 mg at 03/21/20 0915  . amoxicillin (AMOXIL) capsule 500 mg   500 mg Oral Q8H Allie Bossier, MD   500 mg at 03/22/20 0814  . carvedilol (COREG) tablet 3.125 mg  3.125 mg Oral BID WC Allie Bossier, MD   3.125 mg at 03/21/20 1727  . Chlorhexidine Gluconate Cloth 2 % PADS 6 each  6 each Topical Daily Allie Bossier, MD   6 each at 03/20/20 1233  . enoxaparin (LOVENOX) injection 40 mg  40 mg Subcutaneous QHS Jennette Kettle M, DO   40 mg at 03/21/20 2120  . fluticasone (FLONASE) 50 MCG/ACT nasal spray 2 spray  2 spray Each Nare Daily PRN Etta Quill, DO      . levothyroxine (SYNTHROID) tablet 150 mcg  150 mcg Oral QAC breakfast Etta Quill, DO   150 mcg at 03/22/20 4818  . loratadine (CLARITIN) tablet 10 mg  10 mg Oral Daily Jennette Kettle M, DO   10 mg at 03/21/20 0915  . meloxicam (MOBIC) tablet 15 mg  15 mg Oral Daily Jennette Kettle M, DO   15 mg at 03/21/20 1150  . mirabegron ER (MYRBETRIQ) tablet 50 mg  50 mg Oral Daily Alcario Drought, Jared M, DO   50 mg at  03/21/20 1150  . multivitamin with minerals tablet 1 tablet  1 tablet Oral Once per day on Mon Thu [place], Jared M, DO   1 tablet at 03/18/20 0848  . ondansetron (ZOFRAN) tablet 4 mg  4 mg Oral Q6H PRN Etta Quill, DO       Or  . ondansetron [facility]) injection 4 mg  4 mg Intravenous Q6H PRN Etta Quill, DO   4 mg at 03/19/20 1736  . oxyCODONE-acetaminophen (PERCOCET/ROXICET) 5-325 MG per tablet 1 tablet  1 tablet Oral Q6H PRN Etta Quill, DO   1 tablet at 03/21/20 0920  . polyethylene glycol (MIRALAX / GLYCOLAX) packet 17 g  17 g Oral Daily PRN Vernelle Emerald, MD   17 g at 03/20/20 0955     Objective: Vital signs in last 24 hours: Temp:  [97.7 F (36.5 C)-100.4 F (38 C)] 99.3 F (37.4 C) (06/28 0409) Pulse Rate:  [61-88] 73 (06/28 0409) Resp:  [15-20] 15 (06/28 0409) BP: (121-151)/(63-104) 151/68 (06/28 0409) SpO2:  [94 %-100 %] 94 % (06/28 0409) Weight:  [97 kg] 97 kg (06/28 0409)  Intake/Output from previous day: 06/27 0701 - 06/28 0700 In: 960 [P.O.:960] Out:  1625 [Urine:1625] Intake/Output this shift: No intake/output data recorded.   Physical Exam Vitals reviewed.  Constitutional:      Appearance: Normal appearance.  Abdominal:     Palpations: Abdomen is soft.     Tenderness: There is abdominal tenderness (mild RUQ).  Neurological:     Mental Status: He is alert.     Lab Results:  Recent Labs    03/21/20 0304 03/22/20 0522  WBC 5.4 5.1  HGB 9.7* 9.9*  HCT 30.3* 30.4*  PLT 152 147*   BMET Recent Labs    03/21/20 0304 03/22/20 0522  NA 139 138  K 4.1 4.0  CL 104 103  CO2 26 25  GLUCOSE 101* 102*  BUN 26* 24*  CREATININE 0.98 1.05  CALCIUM 8.6* 8.8*   PT/INR No results for input(s): LABPROT, INR in the last 72 hours. ABG No results for input(s): PHART, HCO3 in the last 72 hours.  Invalid input(s): PCO2, PO2  Studies/Results: No results found.   Assessment and Plan: Bilateral distal stones with right obstruction and sepsis doing well s/p bilateral stenting.   I will work on getting him scheduled for ureteroscopy in the next couple of weeks.       LOS: 6 days    [name] 03/22/[phone] ID: Trish Fountain, male   DOB: September 30, 1931, 86 y.o.   MRN: [id number]

## 2020-03-22 NOTE — Progress Notes (Signed)
Inpatient Rehab Admissions Coordinator:   Pt. Progressing rapidly with therapies. PT is recommending home with [facility] at this time. Pt. Feels he is close to his baseline status for mobility and ADLs. Pt.  Currently does not demonstrate medical necessity or functional decline for CIR admission. Will s/o at this time. Pt. And family are understanding and in agreement with this.   Clemens Catholic, [place], [place] Admissions Coordinator  [phone] (Aariona Momon) [phone] (office)

## 2020-03-23 LAB — COMPREHENSIVE METABOLIC PANEL
ALT: 19 U/L (ref 0–44)
AST: 19 U/L (ref 15–41)
Albumin: 3 g/dL — ABNORMAL LOW (ref 3.5–5.0)
Alkaline Phosphatase: 61 U/L (ref 38–126)
Anion gap: 9 (ref 5–15)
BUN: 25 mg/dL — ABNORMAL HIGH (ref 8–23)
CO2: 26 mmol/L (ref 22–32)
Calcium: 8.9 mg/dL (ref 8.9–10.3)
Chloride: 104 mmol/L (ref 98–111)
Creatinine, Ser: 1.06 mg/dL (ref 0.61–1.24)
GFR calc Af Amer: >60 mL/min (ref >60)
GFR calc non Af Amer: >60 mL/min (ref >60)
Glucose, Bld: 108 mg/dL — ABNORMAL HIGH (ref 70–99)
Potassium: 4 mmol/L (ref 3.5–5.1)
Sodium: 139 mmol/L (ref 135–145)
Total Bilirubin: 0.6 mg/dL (ref 0.3–1.2)
Total Protein: 5.9 g/dL — ABNORMAL LOW (ref 6.5–8.1)

## 2020-03-23 LAB — CBC WITH DIFFERENTIAL/PLATELET
Abs Immature Granulocytes: 0.03 10*3/uL (ref 0.00–0.07)
Basophils Absolute: 0 10*3/uL (ref 0.0–0.1)
Basophils Relative: 0 %
Eosinophils Absolute: 0.2 10*3/uL (ref 0.0–0.5)
Eosinophils Relative: 3 %
HCT: 31.7 % — ABNORMAL LOW (ref 39.0–52.0)
Hemoglobin: 10.3 g/dL — ABNORMAL LOW (ref 13.0–17.0)
Immature Granulocytes: 1 %
Lymphocytes Relative: 21 %
Lymphs Abs: 1.2 10*3/uL (ref 0.7–4.0)
MCH: 32 pg (ref 26.0–34.0)
MCHC: 32.5 g/dL (ref 30.0–36.0)
MCV: 98.4 fL (ref 80.0–100.0)
Monocytes Absolute: 0.6 10*3/uL (ref 0.1–1.0)
Monocytes Relative: 10 %
Neutro Abs: 3.7 10*3/uL (ref 1.7–7.7)
Neutrophils Relative %: 65 %
Platelets: 171 10*3/uL (ref 150–400)
RBC: 3.22 MIL/uL — ABNORMAL LOW (ref 4.22–5.81)
RDW: 13.2 % (ref 11.5–15.5)
WBC: 5.6 10*3/uL (ref 4.0–10.5)
nRBC: 0 % (ref 0.0–0.2)

## 2020-03-23 LAB — MAGNESIUM: Magnesium: 1.9 mg/dL (ref 1.7–2.4)

## 2020-03-23 LAB — PHOSPHORUS: Phosphorus: 3.7 mg/dL (ref 2.5–4.6)

## 2020-03-23 MED ORDER — AMLODIPINE BESYLATE 5 MG PO TABS: 5.0000 mg | ORAL_TABLET | Freq: Every day | ORAL | 0 refills | Status: DC

## 2020-03-23 MED ORDER — AMOXICILLIN 500 MG PO CAPS: 500.0000 mg | ORAL_CAPSULE | Freq: Three times a day (TID) | ORAL | 0 refills | Status: DC

## 2020-03-23 MED ORDER — ONDANSETRON HCL 4 MG PO TABS: 4.0000 mg | ORAL_TABLET | Freq: Four times a day (QID) | ORAL | 0 refills | Status: DC | PRN

## 2020-03-23 MED ORDER — TRAZODONE HCL 50 MG PO TABS: 50.0000 mg | ORAL_TABLET | Freq: Every evening | ORAL | 0 refills | Status: DC | PRN

## 2020-03-23 MED ORDER — CARVEDILOL 3.125 MG PO TABS: 3.1250 mg | ORAL_TABLET | Freq: Two times a day (BID) | ORAL | 0 refills | Status: DC

## 2020-03-23 MED ORDER — ACETAMINOPHEN 325 MG PO TABS: 650.0000 mg | ORAL_TABLET | Freq: Four times a day (QID) | ORAL | 0 refills | Status: DC | PRN

## 2020-03-23 MED ORDER — POLYETHYLENE GLYCOL 3350 17 G PO PACK: 17.0000 g | PACK | Freq: Every day | ORAL | 0 refills | Status: DC | PRN

## 2020-03-23 MED FILL — traZODone HCL 50 MG TABS: 50 | 30 days supply | Qty: 30 | Fill #0

## 2020-03-23 MED FILL — CARVEDILOL 3.125 MG TABLET: 3.125 | 30 days supply | Qty: 60 | Fill #0

## 2020-03-23 MED FILL — AMOXICILLIN 500 MG CAPS: 500 | 14 days supply | Qty: 42 | Fill #0

## 2020-03-23 MED FILL — ONDANSETRON HCL 4 MG TABLET: 4 | 5 days supply | Qty: 20 | Fill #0

## 2020-03-23 MED FILL — AMLODIPINE BESYLATE 5 MG TA: 5 | 30 days supply | Qty: 30 | Fill #0

## 2020-03-23 MED FILL — ACETAMINOPHEN 325 MG TABS: 325 | 5 days supply | Qty: 30 | Fill #0

## 2020-03-23 MED FILL — POLYETHYLENE GLYCOL 3350 PO: 17 | 14 days supply | Qty: 238 | Fill #0

## 2020-03-23 NOTE — TOC Transition Note (Signed)
Transition of Care [facility]) - CM/SW Discharge Note   Patient Details  Name: [name] [name] MRN: [id number] Date of Birth: 1931-12-04  Transition of Care [facility]) CM/SW Contact:  Zenon Mayo, RN Phone Number: 03/23/2020, 10:00 AM   Clinical Narrative:    Patient is for dc today, NCM offered choice, wife states [facility] will be good.  Referral made to [place] with [facility].  Soc will begin 24 to 48 hrs post dc.  He has no other needs.   Final next level of care: [place] Barriers to Discharge: No Barriers Identified   Patient Goals and CMS Choice Patient states their goals for this hospitalization and ongoing recovery are:: get better CMS Medicare.gov Compare Post Acute Care list provided to:: Patient Represenative (must comment) Choice offered to / list presented to : Spouse  Discharge Placement                       Discharge Plan and Services                  DME Agency: NA       HH Arranged: RN, Disease Management, PT, OT, Nurse's Aide [place] Agency: [facility] Date [facility] Agency Contacted: 03/23/20 Time HH Agency Contacted: 1000 Representative spoke with at [place]: [place] ([place]) Interventions     Readmission Risk Interventions No flowsheet data found.

## 2020-03-23 NOTE — Progress Notes (Signed)
Discharge and medication education given with teach back to pt and spouse. Questions and concerns answered. Peripheral iv removed and dressing applied. TOC pharmacy delivered medication to pt's room. Pt's belongings with pt: cell phone, clothes, shoes and bag.  Pt transported to [place] entrance in wheelchair.

## 2020-03-23 NOTE — Discharge Summary (Signed)
Physician Discharge Summary  [name] [name]:[id number] DOB: 01/28/1932 DOA: 03/16/2020  PCP: [name] Dus, MD  Admit date: 03/16/2020 Discharge date: 03/23/2020  Time spent: 30 minutes  Recommendations for Outpatient Follow-up:   Sepsis UTI/severe sepsis/ positive Staph Simulans -On admission met criteria for sepsis temp> 38 C, HR> 90 bpm, RR> 20; lactic acid> 2 -Elevated procalcitonin 1.71, will complete 5-day course antibiotics. -6/24 urine cultures positive for Staph Simulans -Blood culture still NGTD -Spoke with Dr. Baxter Flattery ID and she states if pansensitive can change over to amoxicillin or Keflex to complete treatment. -6/26 staph is pansensitive except to Cipro start Amoxicillin 500 mg TID.  Since patient has stents would continue antibiotics until seen by urology.  Increasing PSA/RIGHT & LEFT Hydronephrosis with nephrolithiasis -Received a call from Dr. [name] Urology, who has treated patient in the past for his prostate cancer and urinary tract obstruction.  Concerned that with an increasing PSA and lymphadenopathy (unable to see in epic), that patient may require cystoscopy and stent or percutaneous drain.   -Have ordered CT abdomen pelvis W0 contrast stone protocol as requested. -Dr. Jeffie Pollock will see patient in a.m. await recommendations -6/25 s/p of bilateral ureteral stent see procedures below -6/26 Foley removed -Dr. [name] Urology office will call patient and set up follow-up appointment within 2 weeks for removal of stents.  Lytic lesion LEFT pubic body -6/25 discussed case with Dr. Zola Button oncology who is aware of findings, and attributes them to worsening prostate cancer.  Will address as outpatient.  Not a candidate for aggressive care. -Follow-up Dr. Zola Button oncology metastatic prostatic cancer  20 July_0  530  Pancreatic tail mass -2.7 cm recommend an MRI for further evaluation -See lytic lesion pubic body  Chronic diastolic CHF -Strict in  and out -2.2 L -Daily weight Filed Weights   03/21/20 0532 03/22/20 0409 03/23/20 0333  Weight: 98.3 kg 97 kg 96 kg  -6/26 change metoprolol to Coreg 3.125 BID per cardiology recommendation -6/26 Amlodipine 5 mg daily -Follow-up with Dr. Maury [name] 9 [id number]<\/[id number]>_5  chronic diastolic CHF, essential HTN, demand ischemia  Essential HTN -See CHF  Demand ischemia -See CHF -Trend troponins Results for [name], [name]" (MRN [id number]) as of 03/23/2020 09:25  Ref. Range 03/16/2020 18:36 03/16/2020 20:49 03/17/2020 01:27 03/17/2020 02:53  Troponin I (High Sensitivity) Latest Ref Range: <18 ng/L 43 (H) 124 (HH) 152 (HH) 143 (HH)    Prostate cancer -Currently not on any treatment.   CKD stage IIIb (baseline Cr 1.35) Recent Labs    03/21/20 0304 03/22/20 0522 03/23/20 0441  CREATININE 0.98 1.05 1.06  -Better than baseline. -Avoid all nephrotoxic medication     Discharge Diagnoses:  Principal Problem:   Sepsis secondary to UTI [facility]) Active Problems:   Prostate cancer ([place])   Demand ischemia ([place])   CKD (chronic kidney disease), stage IIIb   Hydronephrosis of right kidney   Discharge Condition: Stable  Diet recommendation: Heart healthy  Filed Weights   03/21/20 0532 03/22/20 0409 03/23/20 0333  Weight: 98.3 kg 97 kg 96 kg    History of present illness:  86 y.o.WM (former [employer]) PMHx HTN, RBBB, prostate CA. nephrolithiasis, Hx urinary tract obstruction, hypothyroidism, CKD stage IIIb  Pt presents to ED with c/o generalized weakness.Patient states that he was transferring from his chair and felt weak and fell face forward. He states that he is wife called immediately. EMS noticed that he was on the floor and was having some chest pain. His EKG in  the field showed STEMI in V1 and V2. Patient states that he has not been on it for that long. He states that he was given aspirin 324 mg by EMS. He has no chest pain currently. Patient is not on any blood  thinners.   ED Course:Cards saw pt: not a STEMI. Found to have Tm 101. Found to have UTI. WBC 11.6. Lactate 3.3.  Creat 1.6, slightly up from baseline of 1.35 earlier this year.  Trops were 43 and 124. Cards feels that this is demand ischemia, pt remains CP free.   Hospital Course:  See above  Procedures: 6/22 Renal Ultrasound;-Increased renal cortical echogenicity compatible with medical renal disease. -Moderate right hydronephrosis without visible shadowing calculus. Distal calculus is not excluded. 6/23 echocardiogram; Left Ventricle: LVEF= 60 to 65%.  -Grade II diastolic dysfunction (pseudonormalization).  6/24 CT renal stone study;-obstructing 4 mm stone at the RIGHT ureterovesicular junction with moderate to severe right hydroureteronephrosis. There are additional dependent contiguous stones within the distal right ureter that span 2 cm. -Mild LEFT hydroureteronephrosis has slightly progressed from prior exam. There is a 6 mm stone in the distal left ureter approximately 9 mm from the bladder insertion, not definitively cause for obstruction as the ureter is dilated distal to this. -Bilateral nonobstructing renal calculi. -Hypodense lesion in the pancreatic tail measuring 2.7 cm, less well-defined currently than on previous exam in the absence of IV contrast. Further evaluation with MRI on an elective basis again recommended. -Progressive retroperitoneal adenopathy, suspicious for metastatic disease. Increased size of prior lymph nodes as well as new lymphadenopathy. -Lytic lesion involving the left pubic body highly suspicious for metastatic disease. No evidence of pathologic fracture. This may be related to known prostatic malignancy, however is unusual as prostate metastasis are typically sclerotic. -Small bilateral pleural effusions, right greater than left. 6/25Bilateral 6 x 26 double-J ureteral stent placed    Consultations: Cardiology Phone consult Dr.  Carlyle Basques ID Dr. [name] [facility] Phone consult Dr. Zola Button oncology    Cultures  6/22 SARS coronavirus negative 6/22 urine positive Staph Simulans 6/22 blood negative final 6/22 blood RIGHT AC negative final   Antibiotics Anti-infectives (From admission, onward)   Start     Ordered Stop   03/20/20 1230  amoxicillin (AMOXIL) capsule 500 mg     Discontinue     03/20/20 1144     03/17/20 0600  cefTRIAXone (ROCEPHIN) 1 g in sodium chloride 0.9 % 100 mL IVPB  Status:  Discontinued        03/16/20 2349 03/20/20 1144   03/16/20 2300  ceFEPIme (MAXIPIME) 1 g in sodium chloride 0.9 % 100 mL IVPB        03/16/20 2228 03/17/20 0007   03/16/20 2230  vancomycin (VANCOCIN) IVPB 1000 mg/200 mL premix  Status:  Discontinued        03/16/20 2228 03/16/20 2349       Discharge Exam: Vitals:   03/22/20 1935 03/22/20 2316 03/23/20 0333 03/23/20 0820  BP: 123/67 128/70 (!) 114/58 (!) 123/107  Pulse: 71 70 72 74  Resp: _0 Temp: 99.6 F (37.6 C) 100 F (37.8 C) 98.6 F (37 C)   TempSrc: Oral Oral Oral   SpO2: 96% 94% 93%   Weight:   96 kg   Height:        General: A/O x4, no acute respiratory distress, sitting in chair comfortably Eyes: negative scleral hemorrhage, negative anisocoria, negative icterus ENT: Negative Runny nose, negative gingival  bleeding, Neck:  Negative scars, masses, torticollis, lymphadenopathy, JVD Lungs: Clear to auscultation bilaterally without wheezes or crackles Cardiovascular: Regular rate and rhythm without murmur gallop or rub normal S1 and S2 Abdomen: negative abdominal pain, nondistended, positive soft, bowel sounds, no rebound, no ascites, no appreciable mass    Discharge Instructions   Allergies as of 03/23/2020   No Known Allergies     Medication List    STOP taking these medications   trimethoprim 100 MG tablet Commonly known as: TRIMPEX     TAKE these medications   acetaminophen 325 MG tablet Commonly known as:  TYLENOL Take 2 tablets (650 mg total) by mouth every 6 (six) hours as needed for mild pain (or Fever >/= 101).   amLODipine 5 MG tablet Commonly known as: NORVASC Take 1 tablet (5 mg total) by mouth daily. Start taking on: March 24, 2020   amoxicillin 500 MG capsule Commonly known as: AMOXIL Take 1 capsule (500 mg total) by mouth every 8 (eight) hours.   carvedilol 3.125 MG tablet Commonly known as: COREG Take 1 tablet (3.125 mg total) by mouth 2 (two) times daily with a meal.   diazepam 10 MG tablet Commonly known as: VALIUM Take 10 mg by mouth once as needed (one hour prior to procedure).   fexofenadine 180 MG tablet Commonly known as: ALLEGRA Take 180 mg by mouth daily.   fluticasone 50 MCG/ACT nasal spray Commonly known as: FLONASE Place 2 sprays into both nostrils daily as needed for allergies or rhinitis.   levothyroxine 150 MCG tablet Commonly known as: SYNTHROID Take 150 mcg by mouth daily before breakfast.   meloxicam 15 MG tablet Commonly known as: MOBIC Take 15 mg by mouth daily.   multivitamin with minerals Tabs tablet Take 1 tablet by mouth 2 (two) times a week.   Myrbetriq 50 MG Tb24 tablet Generic drug: mirabegron ER Take 50 mg by mouth daily.   ondansetron 4 MG tablet Commonly known as: ZOFRAN Take 1 tablet (4 mg total) by mouth every 6 (six) hours as needed for nausea.   oxyCODONE-acetaminophen 5-325 MG tablet Commonly known as: PERCOCET/ROXICET Take 1 tablet by mouth every 6 (six) hours as needed (for pain).   polyethylene glycol 17 g packet Commonly known as: MIRALAX / GLYCOLAX Take 17 g by mouth daily as needed for mild constipation.   traZODone 50 MG tablet Commonly known as: DESYREL Take 1 tablet (50 mg total) by mouth at bedtime as needed for sleep.   Ventolin HFA 108 (90 Base) MCG/ACT inhaler Generic drug: albuterol Inhale 2 puffs into the lungs every 6 (six) hours as needed for wheezing or shortness of breath.      No Known  Allergies  Follow-up Information    Wyatt Portela, MD. Schedule an appointment as soon as possible for a visit in 2 weeks.   Specialty: Oncology Why: schedule follow-up Dr. Zola Button oncology metastatic prostatic cancer  Contact information: [place] [address] [phone]        [name], MD. Schedule an appointment as soon as possible for a visit in 2 weeks.   Specialty: Urology Why: 6/28 schedule follow-up Dr. [name] [facility] 2 weeks for stent removal and ureteroscopy. Contact information: [place] [address] [phone]        [name], MD.   Specialty: Urology Why: The office will call patient Contact information: [place] [address] [phone]  [name] Dus, MD. Daphane Shepherd on 04/02/2020.   Specialty: Family Medicine Why: _0 :45pm Contact information: [place] [address] [phone]                The results of significant diagnostics from this hospitalization (including imaging, microbiology, ancillary and laboratory) are listed below for reference.    Significant Diagnostic Studies: CT Head Wo Contrast  Result Date: 03/16/2020 CLINICAL DATA:  Status post fall. EXAM: CT HEAD WITHOUT CONTRAST TECHNIQUE: Contiguous axial images were obtained from the base of the skull through the vertex without intravenous contrast. COMPARISON:  None. FINDINGS: Brain: There is mild cerebral atrophy with widening of the extra-axial spaces and ventricular dilatation. There are areas of decreased attenuation within the white matter tracts of the supratentorial brain, consistent with microvascular disease changes. Vascular: No hyperdense vessel or unexpected calcification. Skull: Normal. Negative for fracture or focal lesion. Sinuses/Orbits: No acute finding. Other: None. IMPRESSION: 1. Generalized cerebral atrophy. 2. No acute intracranial abnormality. Electronically Signed    By: Virgina Norfolk M.D.   On: 03/16/2020 19:34   CT Cervical Spine Wo Contrast  Result Date: 03/16/2020 CLINICAL DATA:  Status post fall. EXAM: CT CERVICAL SPINE WITHOUT CONTRAST TECHNIQUE: Multidetector CT imaging of the cervical spine was performed without intravenous contrast. Multiplanar CT image reconstructions were also generated. COMPARISON:  None. FINDINGS: Alignment: Normal. Skull base and vertebrae: No acute fracture. No primary bone lesion or focal pathologic process. Soft tissues and spinal canal: No prevertebral fluid or swelling. No visible canal hematoma. Disc levels: Moderate to marked severity endplate sclerosis is seen at the levels of C3-C4, C4-C5, C5-C6 and C6-C7. Marked severity intervertebral disc space narrowing is also seen at these levels. There is moderate severity bilateral multilevel facet joint hypertrophy. Upper chest: Negative. Other: None. IMPRESSION: 1. No acute osseous abnormality. 2. Marked severity multilevel degenerative disc disease and facet joint hypertrophy. Electronically Signed   By: Virgina Norfolk M.D.   On: 03/16/2020 19:36   DG Cystogram  Result Date: 03/22/2020 CLINICAL DATA:  Bilateral ureteral calculi, UTI. EXAM: CYSTOGRAM TECHNIQUE: After catheterization of the urinary bladder following sterile technique the bladder was filled with mL Cysto-Hypaque 30% by drip infusion. Serial spot images were obtained during bladder filling and post draining. COMPARISON:  CT scan 03/18/2020 FINDINGS: 2 intraoperative spot fluoro images obtained. Initial image over the abdomen reveals a stent in the right ureter visualized from the proximal tip looped in the distended renal pelvis down to the midportion of the right ureter which is opacified and appears distended. 2nd image is of the left kidney and upper abdomen. Left internal ureteral stent is visualized with the proximal loop formed in the region of the left renal pelvis. Left intrarenal collecting system is  dilated. IMPRESSION: Intraoperative assessment during bilateral ureteral stent placement. Electronically Signed   By: Misty Stanley M.D.   On: 03/22/2020 07:36   US RENAL  Result Date: 03/17/2020 CLINICAL DATA:  UTI/sepsis EXAM: RENAL / URINARY TRACT ULTRASOUND COMPLETE COMPARISON:  CT 09/23/2019, ultrasound 12/04/2018 FINDINGS: Right Kidney: Renal measurements: 10.5 x 5.9 x 4.9 cm = volume: 159 mL. Diffusely increased renal cortical echogenicity. Moderate right hydronephrosis with central and peripheral calyceal dilatation. No visible shadowing calculus or worrisome renal lesion. Previously seen cysts not well visualized sonographically. Left Kidney: Renal measurements: 10 x 5.7 x 4.2 cm = volume: 124.5 mL. Diffusely increased renal cortical echogenicity. No shadowing calculus, hydronephrosis or concerning renal mass. Previously seen cysts not well visualized sonographically.  Bladder: Appears normal for degree of bladder distention. Other: Technically difficult exam due to patient body habitus and inability to reposition patient. IMPRESSION: Technically challenging exam due to inability to reposition patient and patient's body habitus. Increased renal cortical echogenicity compatible with medical renal disease. Moderate right hydronephrosis without visible shadowing calculus. Distal calculus is not excluded. Electronically Signed   By: Lovena Le M.D.   On: 03/17/2020 00:32   DG Chest Port 1 View  Result Date: 03/16/2020 CLINICAL DATA:  Status post fall with subsequent chest pain. EXAM: PORTABLE CHEST 1 VIEW COMPARISON:  March 20, 2019 FINDINGS: There is no evidence of acute infiltrate, pleural effusion or pneumothorax. The heart size and mediastinal contours are within normal limits. Marked severity degenerative changes seen involving both shoulders with multilevel degenerative changes noted throughout the thoracic spine. A small cortical irregularity is seen along the lateral aspect of the sixth left  rib. Additional chronic sixth and seventh left rib fractures are noted. IMPRESSION: 1. No acute cardiopulmonary disease. 2. Chronic left-sided rib fractures with additional findings which may represent a nondisplaced acute fracture of the sixth left rib. Electronically Signed   By: Virgina Norfolk M.D.   On: 03/16/2020 19:38   ECHOCARDIOGRAM COMPLETE  Result Date: 03/17/2020    ECHOCARDIOGRAM REPORT   Patient Name:   ZAE KIRTZ Date of Exam: 03/17/2020 Medical Rec #:  [id number]        Height:       68.0 in Accession #:    [id number]       Weight:       224.7 lb Date of Birth:  Mar 08, 1932        BSA:          2.147 m Patient Age:    54 years         BP:           173/97 mmHg Patient Gender: M                HR:           66 bpm. Exam Location:  Inpatient Procedure: 2D Echo, Cardiac Doppler and Color Doppler Indications:    Elevated Troponin  History:        Patient has no prior history of Echocardiogram examinations.                 Arrythmias:RBBB; Signs/Symptoms:Murmur. Cancer.  Sonographer:    Jonelle Sidle Dance Referring Phys: [id number] [place]  1. Left ventricular ejection fraction, by estimation, is 60 to 65%. The left ventricle has normal function. The left ventricle has no regional wall motion abnormalities. Left ventricular diastolic parameters are consistent with Grade II diastolic dysfunction (pseudonormalization).  2. Right ventricular systolic function is normal. The right ventricular size is normal. There is normal pulmonary artery systolic pressure.  3. The mitral valve is grossly normal. No evidence of mitral valve regurgitation. No evidence of mitral stenosis.  4. The aortic valve is grossly normal. Aortic valve regurgitation is not visualized. No aortic stenosis is present. FINDINGS  Left Ventricle: Left ventricular ejection fraction, by estimation, is 60 to 65%. The left ventricle has normal function. The left ventricle has no regional wall motion abnormalities. The left  ventricular internal cavity size was normal in size. There is  no left ventricular hypertrophy. Left ventricular diastolic parameters are consistent with Grade II diastolic dysfunction (pseudonormalization). Right Ventricle: The right ventricular size is normal. No increase in right ventricular wall thickness. Right ventricular systolic  function is normal. There is normal pulmonary artery systolic pressure. The tricuspid regurgitant velocity is 2.26 m/s, and  with an assumed right atrial pressure of 8 mmHg, the estimated right ventricular systolic pressure is 85.6 mmHg. Left Atrium: Left atrial size was normal in size. Right Atrium: Right atrial size was normal in size. Pericardium: There is no evidence of pericardial effusion. Mitral Valve: The mitral valve is grossly normal. No evidence of mitral valve regurgitation. No evidence of mitral valve stenosis. Tricuspid Valve: The tricuspid valve is grossly normal. Tricuspid valve regurgitation is trivial. No evidence of tricuspid stenosis. Aortic Valve: The aortic valve is grossly normal. Aortic valve regurgitation is not visualized. Aortic regurgitation PHT measures 744 msec. No aortic stenosis is present. There is mild calcification of the aortic valve. Aortic valve mean gradient measures 7.0 mmHg. Aortic valve peak gradient measures 14.4 mmHg. Aortic valve area, by VTI measures 2.16 cm. Pulmonic Valve: The pulmonic valve was grossly normal. Pulmonic valve regurgitation is not visualized. No evidence of pulmonic stenosis. Aorta: The aortic root and ascending aorta are structurally normal, with no evidence of dilitation. IAS/Shunts: The atrial septum is grossly normal.  LEFT VENTRICLE PLAX 2D LVIDd:         3.80 cm  Diastology LVIDs:         3.20 cm  LV e' lateral:   7.07 cm/s LV PW:         1.20 cm  LV E/e' lateral: 17.1 LV IVS:        1.10 cm  LV e' medial:    6.42 cm/s LVOT diam:     2.00 cm  LV E/e' medial:  18.8 LV SV:         80 LV SV Index:   37 LVOT Area:      3.14 cm  RIGHT VENTRICLE             IVC RV Basal diam:  2.90 cm     IVC diam: 2.10 cm RV S prime:     12.30 cm/s TAPSE (M-mode): 1.7 cm LEFT ATRIUM             Index       RIGHT ATRIUM           Index LA diam:        4.40 cm 2.05 cm/m  RA Area:     12.80 cm LA Vol (A2C):   77.3 ml 36.00 ml/m RA Volume:   25.30 ml  11.78 ml/m LA Vol (A4C):   38.1 ml 17.74 ml/m LA Biplane Vol: 56.4 ml 26.26 ml/m  AORTIC VALVE AV Area (Vmax):    1.97 cm AV Area (Vmean):   1.86 cm AV Area (VTI):     2.16 cm AV Vmax:           189.50 cm/s AV Vmean:          122.000 cm/s AV VTI:            0.372 m AV Peak Grad:      14.4 mmHg AV Mean Grad:      7.0 mmHg LVOT Vmax:         119.00 cm/s LVOT Vmean:        72.400 cm/s LVOT VTI:          0.256 m LVOT/AV VTI ratio: 0.69 AI PHT:            744 msec  AORTA Ao Root diam: 3.80 cm Ao Asc diam:  3.40  cm MITRAL VALVE                TRICUSPID VALVE MV Area (PHT): 2.60 cm     TR Peak grad:   20.4 mmHg MV Decel Time: 292 msec     TR Vmax:        226.00 cm/s MV E velocity: 121.00 cm/s MV A velocity: 116.00 cm/s  SHUNTS MV E/A ratio:  1.04         Systemic VTI:  0.26 m                             Systemic Diam: 2.00 cm Mertie Moores MD Electronically signed by Mertie Moores MD Signature Date/Time: 03/17/2020/3:38:44 PM    Final    CT RENAL STONE STUDY  Result Date: 03/18/2020 CLINICAL DATA:  Hydronephrosis on renal ultrasound yesterday. EXAM: CT ABDOMEN AND PELVIS WITHOUT CONTRAST TECHNIQUE: Multidetector CT imaging of the abdomen and pelvis was performed following the standard protocol without IV contrast. COMPARISON:  Renal ultrasound yesterday.  Abdominal CT 09/23/2019 FINDINGS: Lower chest: Right calcified pleural plaques. There are small bilateral pleural effusions, right greater than left. Mitral annulus calcifications, coronary artery calcifications. Normal heart size. Mild distal esophageal wall thickening. Hepatobiliary: No evidence of focal hepatic lesion on noncontrast exam.  Gallbladder physiologically distended, no calcified stone. No biliary dilatation. Pancreas: Hypodense lesion in the pancreatic tail measures approximately 2.7 cm, this is less well-defined currently than on previous in the absence of IV contrast. No ductal dilatation or inflammation. Spleen: Normal in size without focal abnormality. Adrenals/Urinary Tract: No adrenal nodule. Obstructing 4 mm stone at the right ureterovesicular junction with moderate to severe hydroureteronephrosis. There are additional dependent contiguous stones within the distal right ureter that span 2 cm. Mild right perinephric edema. There are 3 nonobstructing stones in the right kidney measuring 4 and 5 mm. 9 mm hyperdense lesion in the posterior right kidney. Mild left hydroureteronephrosis. This has slightly progressed from prior exam. There is a 6 mm stone in the distal left ureter approximately 9 mm from the bladder insertion, not definitively cause for obstruction of the ureter is dilated distal to this. Mild left perinephric edema. Multiple nonobstructing stones in the left kidney. Urinary bladder is partially distended. No definite bladder wall thickening or bladder stone. Stomach/Bowel: Mild distal colonic diverticulosis without diverticulitis. There is no evidence of bowel obstruction or inflammation. Normal appendix. Stomach is unremarkable. Vascular/Lymphatic: Aorto bi-iliac atherosclerosis. Progressive retroperitoneal adenopathy from prior exam. Left common iliac node currently measures 18 mm, series 3, image 62, previously 15 mm. Left internal iliac nodes are difficult to assess given lack of IV contrast on the current exam but appear progressed. There is new adenopathy in the upper left periaortic station the level of the duodenum with 17 mm lymph node, series 3, image 47. Detailed assessment for adenopathy is inherently limited in the absence of IV contrast. Reproductive: Brachytherapy seeds in the prostate. Other: Bilateral  perinephric edema, right greater than left. Inflammatory change about the right pericolic gutter. No ascites or free air. No evidence of intra-abdominal fluid collections. Patchy air in the anterior abdominal wall typical of medication injection site. Musculoskeletal: Right hip arthroplasty. Degenerative change in the left hip. Lytic lesion involving the left pubic body for example series 6, image 59 and series 3, image 90. Soft tissue component with loss of cortex posteriorly. No evidence of pathologic fracture. No evidence of additional osseous lesion. Remote left posterior  rib fractures. Diffuse degenerative change throughout the spine. IMPRESSION: 1. Obstructing 4 mm stone at the right ureterovesicular junction with moderate to severe right hydroureteronephrosis. There are additional dependent contiguous stones within the distal right ureter that span 2 cm. 2. Mild left hydroureteronephrosis has slightly progressed from prior exam. There is a 6 mm stone in the distal left ureter approximately 9 mm from the bladder insertion, not definitively cause for obstruction as the ureter is dilated distal to this. 3. Bilateral nonobstructing renal calculi. 4. Hypodense lesion in the pancreatic tail measuring 2.7 cm, less well-defined currently than on previous exam in the absence of IV contrast. Further evaluation with MRI on an elective basis again recommended. 5. Progressive retroperitoneal adenopathy, suspicious for metastatic disease. Increased size of prior lymph nodes as well as new lymphadenopathy. 6. Lytic lesion involving the left pubic body highly suspicious for metastatic disease. No evidence of pathologic fracture. This may be related to known prostatic malignancy, however is unusual as prostate metastasis are typically sclerotic. 7. Small bilateral pleural effusions, right greater than left. Aortic Atherosclerosis (ICD10-I70.0). Electronically Signed   By: Keith Rake M.D.   On: 03/18/2020 20:40     Microbiology: Recent Results (from the past 240 hour(s))  SARS Coronavirus 2 by RT PCR (hospital order, performed in [facility] lab) Nasopharyngeal Nasopharyngeal Swab     Status: None   Collection Time: 03/16/20  6:38 PM   Specimen: Nasopharyngeal Swab  Result Value Ref Range Status   SARS Coronavirus 2 NEGATIVE NEGATIVE Final    Comment: (NOTE) SARS-CoV-2 target nucleic acids are NOT DETECTED.  The SARS-CoV-2 RNA is generally detectable in upper and lower respiratory specimens during the acute phase of infection. The lowest concentration of SARS-CoV-2 viral copies this assay can detect is 250 copies / mL. A negative result does not preclude SARS-CoV-2 infection and should not be used as the sole basis for treatment or other patient management decisions.  A negative result may occur with improper specimen collection / handling, submission of specimen other than nasopharyngeal swab, presence of viral mutation(s) within the areas targeted by this assay, and inadequate number of viral copies (<250 copies / mL). A negative result must be combined with clinical observations, patient history, and epidemiological information.  Fact Sheet for Patients:   StrictlyIdeas.no  Fact Sheet for Healthcare Providers: BankingDealers.co.za  This test is not yet approved or  cleared by the [place] FDA and has been authorized for detection and/or diagnosis of SARS-CoV-2 by FDA under an Emergency Use Authorization (EUA).  This EUA will remain in effect (meaning this test can be used) for the duration of the COVID-19 declaration under Section 564(b)(1) of the Act, 21 U.S.C. section 360bbb-3(b)(1), unless the authorization is terminated or revoked sooner.  Performed at [facility], [place] [address], [place], [place] [address]   Blood culture (routine x 2)     Status: None   Collection Time: 03/16/20  6:55 PM   Specimen: BLOOD   Result Value Ref Range Status   Specimen Description BLOOD SITE NOT SPECIFIED  Final   Special Requests   Final    BOTTLES DRAWN AEROBIC AND ANAEROBIC Blood Culture adequate volume   Culture   Final    NO GROWTH 5 DAYS Performed at [facility], [address], [place], [place] [address]    Report Status 03/21/2020 FINAL  Final  Blood culture (routine x 2)     Status: None   Collection Time: 03/16/20  7:03 PM   Specimen: BLOOD  Result Value Ref Range Status   Specimen Description BLOOD RIGHT ANTECUBITAL  Final   Special Requests   Final    BOTTLES DRAWN AEROBIC AND ANAEROBIC Blood Culture adequate volume   Culture   Final    NO GROWTH 5 DAYS Performed at [facility], [address], [place], [place] [address]    Report Status 03/21/2020 FINAL  Final  Culture, Urine     Status: Abnormal   Collection Time: 03/16/20 10:52 PM   Specimen: Urine, Random  Result Value Ref Range Status   Specimen Description URINE, RANDOM  Final   Special Requests   Final    NONE Performed at [facility], [place] [address], [place], [place] [address]    Culture >=100,000 COLONIES/mL STAPHYLOCOCCUS SIMULANS (A)  Final   Report Status 03/19/2020 FINAL  Final   Organism ID, Bacteria STAPHYLOCOCCUS SIMULANS (A)  Final      Susceptibility   Staphylococcus simulans - MIC*    CIPROFLOXACIN >=8 RESISTANT Resistant     GENTAMICIN <=0.5 SENSITIVE Sensitive     NITROFURANTOIN <=16 SENSITIVE Sensitive     OXACILLIN <=0.25 SENSITIVE Sensitive     TETRACYCLINE <=1 SENSITIVE Sensitive     VANCOMYCIN <=0.5 SENSITIVE Sensitive     TRIMETH/SULFA 40 SENSITIVE Sensitive     CLINDAMYCIN <=0.25 SENSITIVE Sensitive     RIFAMPIN <=0.5 SENSITIVE Sensitive     Inducible Clindamycin NEGATIVE Sensitive     * >=100,000 COLONIES/mL STAPHYLOCOCCUS SIMULANS  SARS CORONAVIRUS 2 (TAT 6-24 HRS) Nasopharyngeal Nasopharyngeal Swab     Status: None   Collection Time: 03/22/20  1:53 AM   Specimen: Nasopharyngeal  Swab  Result Value Ref Range Status   SARS Coronavirus 2 NEGATIVE NEGATIVE Final    Comment: (NOTE) SARS-CoV-2 target nucleic acids are NOT DETECTED.  The SARS-CoV-2 RNA is generally detectable in upper and lower respiratory specimens during the acute phase of infection. Negative results do not preclude SARS-CoV-2 infection, do not rule out co-infections with other pathogens, and should not be used as the sole basis for treatment or other patient management decisions. Negative results must be combined with clinical observations, patient history, and epidemiological information. The expected result is Negative.  Fact Sheet for Patients: SugarRoll.be  Fact Sheet for Healthcare Providers: https://www.Hykeem Ojeda-mathews.com/  This test is not yet approved or cleared by the [place] FDA and  has been authorized for detection and/or diagnosis of SARS-CoV-2 by FDA under an Emergency Use Authorization (EUA). This EUA will remain  in effect (meaning this test can be used) for the duration of the COVID-19 declaration under Se ction 564(b)(1) of the Act, 21 U.S.C. section 360bbb-3(b)(1), unless the authorization is terminated or revoked sooner.  Performed at [facility], [place] [address] [place], [place] [address]      Labs: Basic Metabolic Panel: Recent Labs  Lab 03/19/20 0606 03/20/20 0703 03/21/20 0304 03/22/20 0522 03/23/20 0441  NA 138 141 139 138 139  K 4.3 4.9 4.1 4.0 4.0  CL 106 105 104 103 104  CO2 21* _0 GLUCOSE 94 128* 101* 102* 108*  BUN 16 21 26* 24* 25*  CREATININE 0.95 1.02 0.98 1.05 1.06  CALCIUM 8.6* 8.9 8.6* 8.8* 8.9  MG 1.9 2.0 1.8 1.9 1.9  PHOS 2.9 3.7 3.4 3.3 3.7   Liver Function Tests: Recent Labs  Lab 03/19/20 0606 03/20/20 0703 03/21/20 0304 03/22/20 0522 03/23/20 0441  AST  14* 14* _0 ALT _1 ALKPHOS 56 57 59 61 61  BILITOT 0.8 0.5 0.3 0.4 0.6  PROT 5.6* 5.9* 5.4*  6.1* 5.9*  ALBUMIN 2.8* 2.9* 2.8* 2.9* 3.0*   No results for input(s): LIPASE, AMYLASE in the last 168 hours. No results for input(s): AMMONIA in the last 168 hours. CBC: Recent Labs  Lab 03/19/20 0606 03/20/20 0703 03/21/20 0304 03/22/20 0522 03/23/20 0441  WBC 5.4 6.2 5.4 5.1 5.6  NEUTROABS 3.9 5.2 3.7 3.6 3.7  HGB 10.6* 10.6* 9.7* 9.9* 10.3*  HCT 33.6* 33.1* 30.3* 30.4* 31.7*  MCV 101.2* 98.5 99.3 97.4 98.4  PLT 129* 152 152 147* 171   Cardiac Enzymes: Recent Labs  Lab 03/16/20 1836  CKTOTAL 94   BNP: BNP (last 3 results) No results for input(s): BNP in the last 8760 hours.  ProBNP (last 3 results) No results for input(s): PROBNP in the last 8760 hours.  CBG: No results for input(s): GLUCAP in the last 168 hours.     Signed:  Dia Crawford, MD [facility] [phone] pager

## 2020-03-24 ENCOUNTER — Other Ambulatory Visit: Payer: Self-pay | Admitting: Urology

## 2020-03-26 ENCOUNTER — Encounter (HOSPITAL_COMMUNITY): Payer: Self-pay | Admitting: *Deleted

## 2020-03-26 ENCOUNTER — Telehealth (HOSPITAL_COMMUNITY): Payer: Self-pay | Admitting: Radiology

## 2020-03-26 NOTE — Telephone Encounter (Signed)
Patient given detailed instructions per Myocardial Perfusion Study Information Sheet for the test on 04/07/2020 at 10 :45. Patient notified to arrive 15 minutes early and that it is imperative to arrive on time for appointment to keep from having the test rescheduled.  If you need to cancel or reschedule your appointment, please call the office within 24 hours of your appointment. . Patient verbalized understanding.EHK

## 2020-03-30 NOTE — Progress Notes (Addendum)
PCP - Maury Dus Cardiologist - Lyman Bishop    PPM/ICD -  Device Orders -  Rep Notified -   Chest x-ray - 03-16-20 epic EKG - 03-17-20 epic Stress Test -  ECHO - 03-17-20 epic Cardiac Cath -   Sleep Study -  CPAP -   Fasting Blood Sugar -  Checks Blood Sugar _____ times a day  Blood Thinner Instructions: Aspirin Instructions:  ERAS Protcol - PRE-SURGERY Ensure or G2-   COVID TEST- 7-12   Anesthesia review:   Patient denies shortness of breath, fever, cough and chest pain at PAT appointment  none   All instructions explained to the patient, with a verbal understanding of the material. Patient agrees to go over the instructions while at home for a better understanding. Patient also instructed to self quarantine after being tested for COVID-19. The opportunity to ask questions was provided.

## 2020-03-30 NOTE — Patient Instructions (Addendum)
DUE TO COVID-19 ONLY ONE VISITOR IS ALLOWED TO COME WITH YOU AND STAY IN THE WAITING ROOM ONLY DURING PRE OP AND PROCEDURE DAY OF SURGERY. TWO VISITOR MAY VISIT WITH YOU AFTER SURGERY IN YOUR PRIVATE ROOM DURING VISITING HOURS ONLY!  YOU NEED TO HAVE A COVID 19 TEST ON__7-12-21_____ @_______ , THIS TEST MUST BE DONE BEFORE SURGERY, COME  [name] , [address].  ([place]) ONCE YOUR COVID TEST IS COMPLETED, PLEASE BEGIN THE QUARANTINE INSTRUCTIONS AS OUTLINED IN YOUR HANDOUT.                [name] [name]  03/30/2020   Your procedure is scheduled on:  04-06-20   Report to [facility] Main  Entrance   Report to admitting at      0700 AM     Call this number if you have problems the morning of surgery [phone]    Remember: Do not eat food  :After Midnight.you may have clear liquids until 0600 am then nothing by mouth  BRUSH YOUR TEETH MORNING OF SURGERY AND RINSE YOUR MOUTH OUT, NO CHEWING GUM CANDY OR MINTS.     Take these medicines the morning of surgery with A SIP OF WATER: myrbetriq, levothyroxine, fexofenadine, carvedilol, amlodipine, inhaler and bring with you                                 You may not have any metal on your body including hair pins and              piercings  Do not wear jewelry,  lotions, powders or perfumes, deodorant                Men may shave face and neck.   Do not bring valuables to the hospital. [place].  Contacts, dentures or bridgework may not be worn into surgery.  Leave suitcase in the car. After surgery it may be brought to your room.     Patients discharged the day of surgery will not be allowed to drive home. IF YOU ARE HAVING SURGERY AND GOING HOME THE SAME DAY, YOU MUST HAVE AN ADULT TO DRIVE YOU HOME AND BE WITH YOU FOR 24 HOURS. YOU MAY GO HOME BY TAXI OR UBER OR ORTHERWISE, BUT AN ADULT MUST ACCOMPANY YOU HOME AND STAY WITH YOU FOR 24  HOURS.  Name and phone number of your driver:  Special Instructions: N/A              Please read over the following fact sheets you were given: _____________________________________________________________________             [facility] - Preparing for Surgery Before surgery, you can play an important role.  Because skin is not sterile, your skin needs to be as free of germs as possible.  You can reduce the number of germs on your skin by washing with CHG (chlorahexidine gluconate) soap before surgery.  CHG is an antiseptic cleaner which kills germs and bonds with the skin to continue killing germs even after washing. Please DO NOT use if you have an allergy to CHG or antibacterial soaps.  If your skin becomes reddened/irritated stop using the CHG and inform your nurse when you arrive at Short Stay. Do not shave (including legs  and underarms) for at least 48 hours prior to the first CHG shower.  You may shave your face/neck. Please follow these instructions carefully:  1.  Shower with CHG Soap the night before surgery and the  morning of Surgery.  2.  If you choose to wash your hair, wash your hair first as usual with your  normal  shampoo.  3.  After you shampoo, rinse your hair and body thoroughly to remove the  shampoo.                           4.  Use CHG as you would any other liquid soap.  You can apply chg directly  to the skin and wash                       Gently with a scrungie or clean washcloth.  5.  Apply the CHG Soap to your body ONLY FROM THE NECK DOWN.   Do not use on face/ open                           Wound or open sores. Avoid contact with eyes, ears mouth and genitals (private parts).                       Wash face,  Genitals (private parts) with your normal soap.             6.  Wash thoroughly, paying special attention to the area where your surgery  will be performed.  7.  Thoroughly rinse your body with warm water from the neck down.  8.  DO NOT shower/wash with  your normal soap after using and rinsing off  the CHG Soap.                9.  Pat yourself dry with a clean towel.            10.  Wear clean pajamas.            11.  Place clean sheets on your bed the night of your first shower and do not  sleep with pets. Day of Surgery : Do not apply any lotions/deodorants the morning of surgery.  Please wear clean clothes to the hospital/surgery center.  FAILURE TO FOLLOW THESE INSTRUCTIONS MAY RESULT IN THE CANCELLATION OF YOUR SURGERY PATIENT SIGNATURE_________________________________  NURSE SIGNATURE__________________________________  ________________________________________________________________________

## 2020-03-31 ENCOUNTER — Encounter (HOSPITAL_COMMUNITY)
Admission: RE | Admit: 2020-03-31 | Discharge: 2020-03-31 | Disposition: A | Discharge status: Home / Self Care | Payer: Medicare HMO | Source: Ambulatory Visit | Attending: Urology | Admitting: Urology

## 2020-03-31 ENCOUNTER — Encounter (HOSPITAL_COMMUNITY): Payer: Self-pay

## 2020-03-31 ENCOUNTER — Other Ambulatory Visit: Payer: Self-pay

## 2020-03-31 DIAGNOSIS — Z01812 Encounter for preprocedural laboratory examination: Secondary | ICD-10-CM | POA: Insufficient documentation

## 2020-03-31 LAB — CBC
HCT: 32.8 % — ABNORMAL LOW (ref 39.0–52.0)
Hemoglobin: 10.3 g/dL — ABNORMAL LOW (ref 13.0–17.0)
MCH: 31.4 pg (ref 26.0–34.0)
MCHC: 31.4 g/dL (ref 30.0–36.0)
MCV: 100 fL (ref 80.0–100.0)
Platelets: 195 10*3/uL (ref 150–400)
RBC: 3.28 MIL/uL — ABNORMAL LOW (ref 4.22–5.81)
RDW: 13.5 % (ref 11.5–15.5)
WBC: 7.3 10*3/uL (ref 4.0–10.5)
nRBC: 0 % (ref 0.0–0.2)

## 2020-03-31 LAB — BASIC METABOLIC PANEL
Anion gap: 7 (ref 5–15)
BUN: 27 mg/dL — ABNORMAL HIGH (ref 8–23)
CO2: 26 mmol/L (ref 22–32)
Calcium: 8.9 mg/dL (ref 8.9–10.3)
Chloride: 105 mmol/L (ref 98–111)
Creatinine, Ser: 1.39 mg/dL — ABNORMAL HIGH (ref 0.61–1.24)
GFR calc Af Amer: 53 mL/min — ABNORMAL LOW (ref >60)
GFR calc non Af Amer: 46 mL/min — ABNORMAL LOW (ref >60)
Glucose, Bld: 108 mg/dL — ABNORMAL HIGH (ref 70–99)
Potassium: 4.4 mmol/L (ref 3.5–5.1)
Sodium: 138 mmol/L (ref 135–145)

## 2020-04-01 NOTE — Progress Notes (Signed)
Pt. Aware of new surgery date and time arrive at 1:30 and new covid date 04-05-20.

## 2020-04-02 ENCOUNTER — Other Ambulatory Visit (HOSPITAL_COMMUNITY): Payer: Medicare HMO

## 2020-04-05 ENCOUNTER — Other Ambulatory Visit (HOSPITAL_COMMUNITY)
Admission: RE | Admit: 2020-04-05 | Discharge: 2020-04-05 | Disposition: A | Discharge status: Home / Self Care | Payer: Medicare HMO | Source: Ambulatory Visit | Attending: Urology | Admitting: Urology

## 2020-04-05 DIAGNOSIS — Z20822 Contact with and (suspected) exposure to covid-19: Secondary | ICD-10-CM | POA: Insufficient documentation

## 2020-04-05 LAB — SARS CORONAVIRUS 2 (TAT 6-24 HRS): SARS Coronavirus 2: NEGATIVE

## 2020-04-06 ENCOUNTER — Encounter (HOSPITAL_COMMUNITY): Payer: Medicare HMO

## 2020-04-07 ENCOUNTER — Encounter (HOSPITAL_COMMUNITY): Payer: Medicare HMO

## 2020-04-07 MED ORDER — GENTAMICIN SULFATE 40 MG/ML IJ SOLN
5.0000 mg/kg | INTRAVENOUS | Status: DC
  Filled 2020-04-07: qty 10

## 2020-04-08 ENCOUNTER — Encounter (HOSPITAL_COMMUNITY): Payer: Self-pay | Admitting: Urology

## 2020-04-08 ENCOUNTER — Other Ambulatory Visit: Payer: Self-pay

## 2020-04-08 ENCOUNTER — Ambulatory Visit (HOSPITAL_COMMUNITY): Payer: Medicare HMO | Admitting: Certified Registered Nurse Anesthetist

## 2020-04-08 ENCOUNTER — Encounter (HOSPITAL_COMMUNITY)
Admission: RE | Disposition: A | Discharge status: Home / Self Care | Payer: Self-pay | Source: Home / Self Care | Attending: Urology

## 2020-04-08 ENCOUNTER — Ambulatory Visit (HOSPITAL_COMMUNITY): Payer: Medicare HMO

## 2020-04-08 ENCOUNTER — Ambulatory Visit (HOSPITAL_COMMUNITY)
Admission: RE | Admit: 2020-04-08 | Discharge: 2020-04-08 | Disposition: A | Discharge status: Home / Self Care | Payer: Medicare HMO | Attending: Urology | Admitting: Urology

## 2020-04-08 DIAGNOSIS — M1990 Unspecified osteoarthritis, unspecified site: Secondary | ICD-10-CM | POA: Insufficient documentation

## 2020-04-08 DIAGNOSIS — Z8589 Personal history of malignant neoplasm of other organs and systems: Secondary | ICD-10-CM | POA: Diagnosis not present

## 2020-04-08 DIAGNOSIS — N1832 Chronic kidney disease, stage 3b: Secondary | ICD-10-CM | POA: Diagnosis not present

## 2020-04-08 DIAGNOSIS — Z79899 Other long term (current) drug therapy: Secondary | ICD-10-CM | POA: Diagnosis not present

## 2020-04-08 DIAGNOSIS — I129 Hypertensive chronic kidney disease with stage 1 through stage 4 chronic kidney disease, or unspecified chronic kidney disease: Secondary | ICD-10-CM | POA: Insufficient documentation

## 2020-04-08 DIAGNOSIS — Z8546 Personal history of malignant neoplasm of prostate: Secondary | ICD-10-CM | POA: Diagnosis not present

## 2020-04-08 DIAGNOSIS — J45909 Unspecified asthma, uncomplicated: Secondary | ICD-10-CM | POA: Insufficient documentation

## 2020-04-08 DIAGNOSIS — Z79890 Hormone replacement therapy (postmenopausal): Secondary | ICD-10-CM | POA: Diagnosis not present

## 2020-04-08 DIAGNOSIS — I4510 Unspecified right bundle-branch block: Secondary | ICD-10-CM | POA: Diagnosis not present

## 2020-04-08 DIAGNOSIS — N201 Calculus of ureter: Secondary | ICD-10-CM | POA: Insufficient documentation

## 2020-04-08 DIAGNOSIS — Z87442 Personal history of urinary calculi: Secondary | ICD-10-CM | POA: Insufficient documentation

## 2020-04-08 DIAGNOSIS — E039 Hypothyroidism, unspecified: Secondary | ICD-10-CM | POA: Insufficient documentation

## 2020-04-08 HISTORY — PX: CYSTOSCOPY/URETEROSCOPY/HOLMIUM LASER/STENT PLACEMENT: SHX6546

## 2020-04-08 SURGERY — CYSTOSCOPY/URETEROSCOPY/HOLMIUM LASER/STENT PLACEMENT
Anesthesia: General | Site: Ureter | Laterality: Bilateral

## 2020-04-08 MED ORDER — OXYCODONE HCL 5 MG/5ML PO SOLN: 5.0000 mg | Freq: Once | ORAL | Status: DC | PRN

## 2020-04-08 MED ORDER — SODIUM CHLORIDE 0.9% FLUSH: 3.0000 mL | INTRAVENOUS | Status: DC | PRN

## 2020-04-08 MED ORDER — SODIUM CHLORIDE 0.9 % IV SOLN: 250.0000 mL | INTRAVENOUS | Status: DC | PRN

## 2020-04-08 MED ORDER — ORAL CARE MOUTH RINSE: 15.0000 mL | Freq: Once | OROMUCOSAL | Status: AC

## 2020-04-08 MED ORDER — OXYCODONE HCL 5 MG PO TABS: 5.0000 mg | ORAL_TABLET | Freq: Once | ORAL | Status: DC | PRN

## 2020-04-08 MED ORDER — ACETAMINOPHEN 500 MG PO TABS: 1000.0000 mg | ORAL_TABLET | Freq: Once | ORAL | Status: DC | PRN

## 2020-04-08 MED ORDER — 0.9 % SODIUM CHLORIDE (POUR BTL) OPTIME
TOPICAL | Status: DC | PRN
  Administered 2020-04-08: 1000 mL

## 2020-04-08 MED ORDER — PHENYLEPHRINE 40 MCG/ML (10ML) SYRINGE FOR IV PUSH (FOR BLOOD PRESSURE SUPPORT)
PREFILLED_SYRINGE | INTRAVENOUS | Status: DC | PRN
  Administered 2020-04-08 (×2): 80 ug via INTRAVENOUS

## 2020-04-08 MED ORDER — FENTANYL CITRATE (PF) 100 MCG/2ML IJ SOLN: 25.0000 ug | INTRAMUSCULAR | Status: DC | PRN

## 2020-04-08 MED ORDER — FENTANYL CITRATE (PF) 100 MCG/2ML IJ SOLN
INTRAMUSCULAR | Status: DC | PRN
  Administered 2020-04-08 (×2): 50 ug via INTRAVENOUS

## 2020-04-08 MED ORDER — OXYCODONE HCL 5 MG PO TABS: 5.0000 mg | ORAL_TABLET | ORAL | Status: DC | PRN

## 2020-04-08 MED ORDER — FENTANYL CITRATE (PF) 100 MCG/2ML IJ SOLN
INTRAMUSCULAR | Status: AC
  Filled 2020-04-08: qty 2

## 2020-04-08 MED ORDER — ACETAMINOPHEN 650 MG RE SUPP
650.0000 mg | RECTAL | Status: DC | PRN
  Filled 2020-04-08: qty 1

## 2020-04-08 MED ORDER — CEFAZOLIN SODIUM-DEXTROSE 2-4 GM/100ML-% IV SOLN
2.0000 g | INTRAVENOUS | Status: DC
  Filled 2020-04-08: qty 100

## 2020-04-08 MED ORDER — ACETAMINOPHEN 325 MG PO TABS: 650.0000 mg | ORAL_TABLET | ORAL | Status: DC | PRN

## 2020-04-08 MED ORDER — IOHEXOL 300 MG/ML  SOLN
INTRAMUSCULAR | Status: DC | PRN
  Administered 2020-04-08: 5 mL

## 2020-04-08 MED ORDER — SODIUM CHLORIDE 0.9 % IR SOLN
Status: DC | PRN
  Administered 2020-04-08: 3000 mL

## 2020-04-08 MED ORDER — LACTATED RINGERS IV SOLN: INTRAVENOUS | Status: DC

## 2020-04-08 MED ORDER — EPHEDRINE SULFATE-NACL 50-0.9 MG/10ML-% IV SOSY
PREFILLED_SYRINGE | INTRAVENOUS | Status: DC | PRN
  Administered 2020-04-08 (×3): 10 mg via INTRAVENOUS

## 2020-04-08 MED ORDER — ACETAMINOPHEN 160 MG/5ML PO SOLN: 1000.0000 mg | Freq: Once | ORAL | Status: DC | PRN

## 2020-04-08 MED ORDER — SODIUM CHLORIDE 0.9% FLUSH: 3.0000 mL | Freq: Two times a day (BID) | INTRAVENOUS | Status: DC

## 2020-04-08 MED ORDER — MORPHINE SULFATE (PF) 4 MG/ML IV SOLN: 2.0000 mg | INTRAVENOUS | Status: DC | PRN

## 2020-04-08 MED ORDER — PROPOFOL 10 MG/ML IV BOLUS
INTRAVENOUS | Status: DC | PRN
  Administered 2020-04-08: 140 mg via INTRAVENOUS

## 2020-04-08 MED ORDER — LIDOCAINE 2% (20 MG/ML) 5 ML SYRINGE
INTRAMUSCULAR | Status: DC | PRN
  Administered 2020-04-08: 60 mg via INTRAVENOUS

## 2020-04-08 MED ORDER — CHLORHEXIDINE GLUCONATE 0.12 % MT SOLN
15.0000 mL | Freq: Once | OROMUCOSAL | Status: AC
  Administered 2020-04-08: 15 mL via OROMUCOSAL

## 2020-04-08 MED ORDER — ACETAMINOPHEN 10 MG/ML IV SOLN: 1000.0000 mg | Freq: Once | INTRAVENOUS | Status: DC | PRN

## 2020-04-08 SURGICAL SUPPLY — 23 items
BAG URO CATCHER STRL LF (MISCELLANEOUS) ×2 IMPLANT
BASKET STONE NCOMPASS (UROLOGICAL SUPPLIES) IMPLANT
CATH URET 5FR 28IN OPEN ENDED (CATHETERS) IMPLANT
CATH URET DUAL LUMEN 6-10FR 50 (CATHETERS) IMPLANT
CLOTH BEACON ORANGE TIMEOUT ST (SAFETY) ×2 IMPLANT
EXTRACTOR STONE NITINOL NGAGE (UROLOGICAL SUPPLIES) ×1 IMPLANT
FIBER LASER FLEXIVA 1000 (UROLOGICAL SUPPLIES) IMPLANT
FIBER LASER FLEXIVA 365 (UROLOGICAL SUPPLIES) IMPLANT
FIBER LASER FLEXIVA 550 (UROLOGICAL SUPPLIES) IMPLANT
FIBER LASER TRAC TIP (UROLOGICAL SUPPLIES) IMPLANT
GLOVE SURG SS PI 8.0 STRL IVOR (GLOVE) ×2 IMPLANT
GOWN STRL REUS W/TWL XL LVL3 (GOWN DISPOSABLE) ×2 IMPLANT
GUIDEWIRE ANG ZIPWIRE 035X150 (WIRE) ×1 IMPLANT
GUIDEWIRE ANG ZIPWIRE 038X150 (WIRE) ×1 IMPLANT
GUIDEWIRE STR DUAL SENSOR (WIRE) ×3 IMPLANT
IV NS IRRIG 3000ML ARTHROMATIC (IV SOLUTION) ×2 IMPLANT
KIT TURNOVER KIT A (KITS) IMPLANT
MANIFOLD NEPTUNE II (INSTRUMENTS) ×2 IMPLANT
PACK CYSTO (CUSTOM PROCEDURE TRAY) ×2 IMPLANT
SHEATH URETERAL 12FRX35CM (MISCELLANEOUS) ×1 IMPLANT
STENT URET 6FRX24 CONTOUR (STENTS) ×1 IMPLANT
TUBING CONNECTING 10 (TUBING) ×2 IMPLANT
TUBING UROLOGY SET (TUBING) ×2 IMPLANT

## 2020-04-08 NOTE — Op Note (Signed)
Procedure: 1.  Cystoscopy with removal of right double-J stent. 2.  Right ureteroscopy with stone extraction. 3.  Cystoscopy with insertion of right double-J stent. 4.  Cystoscopy with removal of left double-J stent. 5.  Cystoscopy with left retrograde pyelogram and interpretation. 6.  Left ureteroscopy with stone extraction. 7.  Application of fluoroscopy.  Preop diagnosis: Bilateral distal ureteral stones with prior stenting.  Postop diagnosis: Same.  Surgeon: Dr. [name].  Anesthesia: General.  Specimen: Stones.  Drains: 6 French by 24 cm right double-J stent with tether.  EBL: None.  Complications: None.  Indications: Patient is an 86 year old male with a history of recurrent urolithiasis and metastatic prostate cancer with prior seed implant and TURP.  He was recently admitted to the hospital with sepsis and was found to have right distal ureteral stones with obstruction and left distal ureteral stones without obstruction.  He was taken the operating room where cystoscopy and bilateral stent insertion was performed and he returns today for stone management.  Procedure: He had been on amoxicillin preoperatively.  He was given Ancef and gentamicin for further coverage.  A general anesthetic was induced.  He was placed in lithotomy position and fitted with PAS hose.  His perineum and genitalia were prepped with Betadine solution he was draped in usual sterile fashion.  Cystoscopy was performed using the 23 [place] scope and 30 degree lens.  Examination revealed a normal urethra.  The external sphincter was patulous.  The prostatic urethra was widely resected.  There was stents bilaterally noted at the bladder neck which had some abnormal tissue consistent with radiation effect and mild necrosis.  The bladder was difficult to evaluate due to turbidity but no obvious mucosal lesions or stones were identified.  The right stent was grasped and pulled the urethral meatus.  A sensor wire  was passed through the stent with some resistance to the kidney and the stent was removed.  An attempt was made to pass the 6 French semirigid ureteroscope alongside the wire but due to the fixed bladder neck from his prior prostate cancer treatments and angulation I was unable to enter the ureter.  A 12/14 French 35 cm digital access sheath inner core was passed over the wire into the distal ureter without difficulty.  The assembled sheath was then passed.  I then removed the inner core and passed the single-lumen digital flexible scope alongside the wire.  Initially was felt to have 2 stones but multiple stones were encountered and approximately 5 were removed from the right distal ureter but some were too big to be removed alongside the wire so the wire had to be removed and on occasion reendoscopy with the cystoscope and ureteroscope with repassage of the wire to replace the sheath was needed because of the sheath dislodging in such a distal portion of the ureter.  But eventually I was able to clear the distal ureter.  I passed the scope as far proximally as possible and saw no additional stones.  The sensor wire was left up the right ureteral orifice to the kidney.  The left ureteral stent was then pulled the urethral meatus and a guidewire was passed.  The proximal stent loop appeared to been formed in the mid ureter and I was unable to get a wire beyond this point.  An obvious stricture was not identified but the ureter was quite patulous and dilated to the kidney from the meatus.  I attempted the sensor wire, the straight Glidewire and the angled  tip Glidewire all without success.  And subsequently attempted to get up closer with the rigid scope all without success.  I was able to use the semirigid 6-1/2 French dual-lumen ureteroscope and retrieve the 2 known stones from the distal ureter.  Since he had not had obstruction initially of any significance on the left side and the meatus appeared patent I  elected not to replace a stent since I could not advance the wire to the kidney.  I then passed a 6 [place] by 24 cm right contour double-J stent with tether to the right kidney under fluoroscopic guidance.  The wire was removed leaving good coil in the kidney and a good coil in the bladder.  The distal loop was confirmed fluoroscopically and cystoscopically since his anatomy had been altered.  The bladder was drained and the cystoscope was removed leaving the stent string exiting urethra.  The string was secured to the patient's penis.  The stone fragments will be given to the patient's wife to bring to the office.  He was taken down from lithotomy position, his anesthetic was reversed and he was moved recovery in stable condition.  There were no complications.

## 2020-04-08 NOTE — Discharge Instructions (Signed)
Ureteral Stent Implantation, Care After This sheet gives you information about how to care for yourself after your procedure. Your health care provider may also give you more specific instructions. If you have problems or questions, contact your health care provider. What can I expect after the procedure? After the procedure, it is common to have:  Nausea.  Mild pain when you urinate. You may feel this pain in your lower back or lower abdomen. The pain should stop within a few minutes after you urinate. This may last for up to 1 week.  A small amount of blood in your urine for several days. Follow these instructions at home: Medicines  Take over-the-counter and prescription medicines only as told by your health care provider.  If you were prescribed an antibiotic medicine, take it as told by your health care provider. Do not stop taking the antibiotic even if you start to feel better.  Do not drive for 24 hours if you were given a sedative during your procedure.  Ask your health care provider if the medicine prescribed to you requires you to avoid driving or using heavy machinery. Activity  Rest as told by your health care provider.  Avoid sitting for a long time without moving. Get up to take short walks every 1-2 hours. This is important to improve blood flow and breathing. Ask for help if you feel weak or unsteady.  Return to your normal activities as told by your health care provider. Ask your health care provider what activities are safe for you. General instructions   Watch for any blood in your urine. Call your health care provider if the amount of blood in your urine increases.  If you have a catheter: ? Follow instructions from your health care provider about taking care of your catheter and collection bag. ? Do not take baths, swim, or use a hot tub until your health care provider approves. Ask your health care provider if you may take showers. You may only be allowed to  take sponge baths.  Drink enough fluid to keep your urine pale yellow.  Do not use any products that contain nicotine or tobacco, such as cigarettes, e-cigarettes, and chewing tobacco. These can delay healing after surgery. If you need help quitting, ask your health care provider.  Keep all follow-up visits as told by your health care provider. This is important. Contact a health care provider if:  You have pain that gets worse or does not get better with medicine, especially pain when you urinate.  You have difficulty urinating.  You feel nauseous or you vomit repeatedly during a period of more than 2 days after the procedure. Get help right away if:  Your urine is dark red or has blood clots in it.  You are leaking urine (have incontinence).  The end of the stent comes out of your urethra.  You cannot urinate.  You have sudden, sharp, or severe pain in your abdomen or lower back.  You have a fever.  You have swelling or pain in your legs.  You have difficulty breathing. Summary  After the procedure, it is common to have mild pain when you urinate that goes away within a few minutes after you urinate. This may last for up to 1 week.  Watch for any blood in your urine. Call your health care provider if the amount of blood in your urine increases.  Take over-the-counter and prescription medicines only as told by your health care provider.  Drink   enough fluid to keep your urine pale yellow.  You may remove the stent by pulling the attached string on Monday morning.  If you don't feel you can do that, please call the office to come in to have it done.   This information is not intended to replace advice given to you by your health care provider. Make sure you discuss any questions you have with your health care provider. Document Revised: 06/18/2018 Document Reviewed: 06/19/2018 Elsevier Patient Education  2020 [employer].

## 2020-04-08 NOTE — Transfer of Care (Signed)
Immediate Anesthesia Transfer of Care Note  Patient: [name] [name]  Procedure(s) Performed: CYSTOSCOPY BILATERAL URETEROSCOPYSTENT PLACEMENT (Bilateral Ureter)  Patient Location: PACU  Anesthesia Type:General  Level of Consciousness: awake, alert  and oriented  Airway & Oxygen Therapy: Patient Spontanous Breathing and Patient connected to face mask oxygen  Post-op Assessment: Report given to RN and Post -op Vital signs reviewed and stable  Post vital signs: Reviewed and stable  Last Vitals:  Vitals Value Taken Time  BP    Temp    Pulse    Resp    SpO2      Last Pain:  Vitals:   04/08/20 1351  TempSrc:   PainSc: 0-No pain      Patients Stated Pain Goal: 4 (09/79/49 9718)  Complications: No complications documented.

## 2020-04-08 NOTE — Anesthesia Procedure Notes (Signed)
Procedure Name: LMA Insertion Performed by: Ayuub Penley J, CRNA Pre-anesthesia Checklist: Patient identified, Emergency Drugs available, Suction available, Patient being monitored and Timeout performed Patient Re-evaluated:Patient Re-evaluated prior to induction Oxygen Delivery Method: Circle system utilized Preoxygenation: Pre-oxygenation with 100% oxygen Induction Type: IV induction Ventilation: Mask ventilation without difficulty LMA: LMA inserted LMA Size: 4.0 Number of attempts: 1 Placement Confirmation: positive ETCO2 and breath sounds checked- equal and bilateral Tube secured with: Tape Dental Injury: Teeth and Oropharynx as per pre-operative assessment        

## 2020-04-08 NOTE — Interval H&P Note (Signed)
History and Physical Interval Note:  He will have ureteroscopic management of his bilateral ureteral stones today.   04/08/2020 2:01 PM  [name] [name]  has presented today for surgery, with the diagnosis of BILATERAL URETERAL STONES.  The various methods of treatment have been discussed with the patient and family. After consideration of risks, benefits and other options for treatment, the patient has consented to  Procedure(s): CYSTOSCOPY BILATERAL URETEROSCOPY POSSIBLE  HOLMIUM LASER/STENT PLACEMENT (Bilateral) as a surgical intervention.  The patient's history has been reviewed, patient examined, no change in status, stable for surgery.  I have reviewed the patient's chart and labs.  Questions were answered to the patient's satisfaction.     [name]

## 2020-04-08 NOTE — Anesthesia Postprocedure Evaluation (Signed)
Anesthesia Post Note  Patient: [name] [name]  Procedure(s) Performed: CYSTOSCOPY BILATERAL [name] (Bilateral Ureter)     Patient location during evaluation: PACU Anesthesia Type: General Level of consciousness: awake and alert Pain management: pain level controlled Vital Signs Assessment: post-procedure vital signs reviewed and stable Respiratory status: spontaneous breathing, nonlabored ventilation, respiratory function stable and patient connected to nasal cannula oxygen Cardiovascular status: blood pressure returned to baseline and stable Postop Assessment: no apparent nausea or vomiting Anesthetic complications: no   No complications documented.  Last Vitals:  Vitals:   04/08/20 1715 04/08/20 1730  BP: (!) 144/77 (!) 143/82  Pulse: 67 67  Resp: 14 16  Temp:    SpO2: 92% 97%    Last Pain:  Vitals:   04/08/20 1730  TempSrc:   PainSc: 0-No pain                 [place]

## 2020-04-08 NOTE — Anesthesia Preprocedure Evaluation (Addendum)
Anesthesia Evaluation  Patient identified by MRN, date of birth, ID band Patient awake    Reviewed: Allergy & Precautions, NPO status , Patient's Chart, lab work & pertinent test results, reviewed documented beta blocker date and time   History of Anesthesia Complications Negative for: history of anesthetic complications  Airway Mallampati: I  TM Distance: >3 FB Neck ROM: Full    Dental  (+) Dental Advisory Given, Partial Lower, Partial Upper,    Pulmonary asthma ,    breath sounds clear to auscultation       Cardiovascular hypertension, Pt. on medications and Pt. on home beta blockers (-) angina(-) Past MI and (-) CHF + dysrhythmias  Rhythm:Regular  1. Left ventricular ejection fraction, by estimation, is 60 to 65%. The  left ventricle has normal function. The left ventricle has no regional  wall motion abnormalities. Left ventricular diastolic parameters are  consistent with Grade II diastolic  dysfunction (pseudonormalization).  2. Right ventricular systolic function is normal. The right ventricular  size is normal. There is normal pulmonary artery systolic pressure.  3. The mitral valve is grossly normal. No evidence of mitral valve  regurgitation. No evidence of mitral stenosis.  4. The aortic valve is grossly normal. Aortic valve regurgitation is not  visualized. No aortic stenosis is present.    Neuro/Psych negative neurological ROS  negative psych ROS   GI/Hepatic negative GI ROS, Neg liver ROS,   Endo/Other  Hypothyroidism   Renal/GU ARF and CRFRenal diseaseBILATERAL URETERAL STONES     Musculoskeletal  (+) Arthritis ,   Abdominal   Peds  Hematology  (+) Blood dyscrasia, anemia ,   Anesthesia Other Findings   Reproductive/Obstetrics                            Anesthesia Physical Anesthesia Plan  ASA: II  Anesthesia Plan: General   Post-op Pain Management:     Induction: Intravenous  PONV Risk Score and Plan: 2 and Ondansetron and Dexamethasone  Airway Management Planned: LMA and Oral ETT  Additional Equipment: None  Intra-op Plan:   Post-operative Plan: Extubation in OR  Informed Consent: I have reviewed the patients History and Physical, chart, labs and discussed the procedure including the risks, benefits and alternatives for the proposed anesthesia with the patient or authorized representative who has indicated his/her understanding and acceptance.     Dental advisory given  Plan Discussed with: CRNA and Surgeon  Anesthesia Plan Comments:         Anesthesia Quick Evaluation

## 2020-04-09 ENCOUNTER — Encounter (HOSPITAL_COMMUNITY): Payer: Self-pay | Admitting: Urology

## 2020-04-13 ENCOUNTER — Ambulatory Visit: Payer: Medicare HMO | Admitting: Oncology

## 2020-04-13 ENCOUNTER — Other Ambulatory Visit: Payer: Self-pay

## 2020-04-13 ENCOUNTER — Inpatient Hospital Stay (HOSPITAL_BASED_OUTPATIENT_CLINIC_OR_DEPARTMENT_OTHER): Payer: Medicare HMO | Admitting: Oncology

## 2020-04-13 ENCOUNTER — Inpatient Hospital Stay: Payer: Medicare HMO | Attending: Oncology

## 2020-04-13 ENCOUNTER — Other Ambulatory Visit: Payer: Medicare HMO

## 2020-04-13 VITALS — BP 149/69 | HR 63 | Temp 97.9°F | Resp 16 | Ht 68.0 in | Wt 217.7 lb

## 2020-04-13 DIAGNOSIS — I10 Essential (primary) hypertension: Secondary | ICD-10-CM | POA: Insufficient documentation

## 2020-04-13 DIAGNOSIS — K869 Disease of pancreas, unspecified: Secondary | ICD-10-CM | POA: Insufficient documentation

## 2020-04-13 DIAGNOSIS — C61 Malignant neoplasm of prostate: Secondary | ICD-10-CM

## 2020-04-13 LAB — CBC WITH DIFFERENTIAL (CANCER CENTER ONLY)
Abs Immature Granulocytes: 0.03 10*3/uL (ref 0.00–0.07)
Basophils Absolute: 0 10*3/uL (ref 0.0–0.1)
Basophils Relative: 1 %
Eosinophils Absolute: 0.3 10*3/uL (ref 0.0–0.5)
Eosinophils Relative: 5 %
HCT: 31.1 % — ABNORMAL LOW (ref 39.0–52.0)
Hemoglobin: 10 g/dL — ABNORMAL LOW (ref 13.0–17.0)
Immature Granulocytes: 1 %
Lymphocytes Relative: 19 %
Lymphs Abs: 1.1 10*3/uL (ref 0.7–4.0)
MCH: 31.5 pg (ref 26.0–34.0)
MCHC: 32.2 g/dL (ref 30.0–36.0)
MCV: 98.1 fL (ref 80.0–100.0)
Monocytes Absolute: 0.5 10*3/uL (ref 0.1–1.0)
Monocytes Relative: 8 %
Neutro Abs: 4 10*3/uL (ref 1.7–7.7)
Neutrophils Relative %: 66 %
Platelet Count: 154 10*3/uL (ref 150–400)
RBC: 3.17 MIL/uL — ABNORMAL LOW (ref 4.22–5.81)
RDW: 13.4 % (ref 11.5–15.5)
WBC Count: 6 10*3/uL (ref 4.0–10.5)
nRBC: 0 % (ref 0.0–0.2)

## 2020-04-13 LAB — CMP (CANCER CENTER ONLY)
ALT: 14 U/L (ref 0–44)
AST: 17 U/L (ref 15–41)
Albumin: 3.4 g/dL — ABNORMAL LOW (ref 3.5–5.0)
Alkaline Phosphatase: 113 U/L (ref 38–126)
Anion gap: 10 (ref 5–15)
BUN: 26 mg/dL — ABNORMAL HIGH (ref 8–23)
CO2: 25 mmol/L (ref 22–32)
Calcium: 9.5 mg/dL (ref 8.9–10.3)
Chloride: 105 mmol/L (ref 98–111)
Creatinine: 1.19 mg/dL (ref 0.61–1.24)
GFR, Est AFR Am: >60 mL/min (ref >60)
GFR, Estimated: 55 mL/min — ABNORMAL LOW (ref >60)
Glucose, Bld: 103 mg/dL — ABNORMAL HIGH (ref 70–99)
Potassium: 4.5 mmol/L (ref 3.5–5.1)
Sodium: 140 mmol/L (ref 135–145)
Total Bilirubin: 0.4 mg/dL (ref 0.3–1.2)
Total Protein: 6.8 g/dL (ref 6.5–8.1)

## 2020-04-13 NOTE — Progress Notes (Signed)
Order placed for wheeled walker per Dr. Alen Blew. Spoke to [place] at [employer] and made him aware. Confirmed patient's phone number and address with Zach.

## 2020-04-13 NOTE — Progress Notes (Signed)
Hematology and Oncology Follow Up Visit  [name] [name] [id number] 1932-04-22 86 y.o. 04/13/2020 3:15 PM [name] [name], [name] [place], MD    Principle Diagnosis: 86 year old man with castration-resistant prostate cancer with lymphadenopathy diagnosed in 2016. He presented in 1998 with Gleason score of 9.    Prior Therapy: He was started on firmagon in October 2014.   His PSA started to rise up to 1.91 in June 2016 after a rise from 0.78 in March 2016.PSA was 0.53 in November 2015.   Xtandi 160 mg daily started in August 2016.  Therapy interrupted until March 2018 because of poor tolerance.  Current therapy:    He is receiving Lupron every 3 months under the care of Dr. Jeffie [name].  Xtandi 40 mg daily started in July 2018. His dose was increased to 160 mg in March 2021.  Therapy withheld in April 2021.  Interim History: [name] [name] is here for a follow-up evaluation.  Since the last visit, he was hospitalized in June 2021 after presenting with urosepsis and was treated with antibiotics and subsequently oral amoxicillin.  He was also found to have obstructing 4 mm stone in the right ureterovesicular junction and underwent cystoscopy and right ureteroscopy and stone extraction and a double J stent placement on July 15 of 2021.  Since that procedure, he reports feeling better at this time and recovering slowly.  Since stopping Xtandi he feels improvement in his quality of life and overall fatigue level.  He is able to ambulate with the help of walker and participating in physical therapy.  Is eating better without any bone pain or pathological fractures.   Medications: Reviewed without changes. Current Outpatient Medications  Medication Sig Dispense Refill  . acetaminophen (TYLENOL) 325 MG tablet Take 2 tablets (650 mg total) by mouth every 6 (six) hours as needed for mild pain (or Fever >/= 101). 30 tablet 0  . albuterol (VENTOLIN HFA) 108 (90 Base) MCG/ACT inhaler Inhale 2 puffs into the  lungs every 6 (six) hours as needed for wheezing or shortness of breath.    Marland Kitchen amoxicillin (AMOXIL) 500 MG capsule Take 1 capsule (500 mg total) by mouth every 8 (eight) hours. 42 capsule 0  . carvedilol (COREG) 3.125 MG tablet Take 1 tablet (3.125 mg total) by mouth 2 (two) times daily with a meal. 60 tablet 0  . diazepam (VALIUM) 10 MG tablet Take 10 mg by mouth once as needed (one hour prior to procedure).     . fexofenadine (ALLEGRA) 180 MG tablet Take 180 mg by mouth daily.    . fluticasone (FLONASE) 50 MCG/ACT nasal spray Place 2 sprays into both nostrils daily as needed for allergies or rhinitis.    Marland Kitchen levothyroxine (SYNTHROID, LEVOTHROID) 150 MCG tablet Take 150 mcg by mouth daily before breakfast.    . meloxicam (MOBIC) 15 MG tablet Take 15 mg by mouth daily.    . Multiple Vitamin (MULTIVITAMIN WITH MINERALS) TABS tablet Take 1 tablet by mouth 2 (two) times a week.     Marland Kitchen MYRBETRIQ 50 MG TB24 tablet Take 50 mg by mouth daily.   0  . ondansetron (ZOFRAN) 4 MG tablet Take 1 tablet (4 mg total) by mouth every 6 (six) hours as needed for nausea. 20 tablet 0  . oxyCODONE-acetaminophen (PERCOCET/ROXICET) 5-325 MG tablet Take 1 tablet by mouth every 6 (six) hours as needed (for pain).     . polyethylene glycol (MIRALAX / GLYCOLAX) 17 g packet Take 17 g by mouth daily as needed  for mild constipation. 14 each 0  . traZODone (DESYREL) 50 MG tablet Take 1 tablet (50 mg total) by mouth at bedtime as needed for sleep. 30 tablet 0   No current facility-administered medications for this visit.     Allergies: No Known Allergies     Physical Exam:   Blood pressure (!) 149/69, pulse 63, temperature 97.9 F (36.6 C), temperature source Temporal, resp. rate 16, height 5\' 8"  (1.727 m), weight 217 lb 11.2 oz (98.7 kg), SpO2 96 %.     ECOG: 1     General appearance: Alert, awake without any distress. Head: Atraumatic without abnormalities Oropharynx: Without any thrush or ulcers. Eyes: No  scleral icterus. Lymph nodes: No lymphadenopathy noted in the cervical, supraclavicular, or axillary nodes Heart:regular rate and rhythm, without any murmurs or gallops.   Lung: Clear to auscultation without any rhonchi, wheezes or dullness to percussion. Abdomin: Soft, nontender without any shifting dullness or ascites. Musculoskeletal: No clubbing or cyanosis. Neurological: No motor or sensory deficits. Skin: No rashes or lesions.      Lab Results: Lab Results  Component Value Date   WBC 7.3 03/31/2020   HGB 10.3 (L) 03/31/2020   HCT 32.8 (L) 03/31/2020   MCV 100.0 03/31/2020   PLT 195 03/31/2020     Chemistry      Component Value Date/Time   NA 138 03/31/2020 1403   NA 140 06/05/2017 1443   K 4.4 03/31/2020 1403   K 4.6 06/05/2017 1443   CL 105 03/31/2020 1403   CO2 26 03/31/2020 1403   CO2 24 06/05/2017 1443   BUN 27 (H) 03/31/2020 1403   BUN 22.7 06/05/2017 1443   CREATININE 1.39 (H) 03/31/2020 1403   CREATININE 1.35 (H) 01/13/2020 0940   CREATININE 1.2 06/05/2017 1443      Component Value Date/Time   CALCIUM 8.9 03/31/2020 1403   CALCIUM 9.4 06/05/2017 1443   ALKPHOS 61 03/23/2020 0441   ALKPHOS 66 06/05/2017 1443   AST 19 03/23/2020 0441   AST 14 (L) 01/13/2020 0940   AST 17 06/05/2017 1443   ALT 19 03/23/2020 0441   ALT 9 01/13/2020 0940   ALT 15 06/05/2017 1443   BILITOT 0.6 03/23/2020 0441   BILITOT 0.9 01/13/2020 0940   BILITOT 0.41 06/05/2017 1443     Results for [name], [name] (MRN [id number]) as of 04/13/2020 15:17  Ref. Range 10/14/2019 09:22 01/13/2020 09:40  Prostate Specific Ag, Serum Latest Ref Range: 0.0 - 4.0 ng/mL 17.7 (H) 34.2 (H)   IMPRESSION: 1. Obstructing 4 mm stone at the right ureterovesicular junction with moderate to severe right hydroureteronephrosis. There are additional dependent contiguous stones within the distal right ureter that span 2 cm. 2. Mild left hydroureteronephrosis has slightly progressed from prior  exam. There is a 6 mm stone in the distal left ureter approximately 9 mm from the bladder insertion, not definitively cause for obstruction as the ureter is dilated distal to this. 3. Bilateral nonobstructing renal calculi. 4. Hypodense lesion in the pancreatic tail measuring 2.7 cm, less well-defined currently than on previous exam in the absence of IV contrast. Further evaluation with MRI on an elective basis again recommended. 5. Progressive retroperitoneal adenopathy, suspicious for metastatic disease. Increased size of prior lymph nodes as well as new lymphadenopathy. 6. Lytic lesion involving the left pubic body highly suspicious for metastatic disease. No evidence of pathologic fracture. This may be related to known prostatic malignancy, however is unusual as prostate metastasis are typically  sclerotic. 7. Small bilateral pleural effusions, right greater than left.   Impression and Plan:   86 year old man with:  1.  Advanced prostate cancer with lymphadenopathy diagnosed in 2016.  He has castration-resistant disease.   The natural course of this disease was reviewed and treatment options were discussed.  Systemic chemotherapy versus switching to a different oral targeted therapy were reviewed. PARP inhibitor would be also an option if he harbors the appropriate mutation.  After discussion today, I have recommended an interval of supportive care reevaluate in 2 to 3 months and will evaluate for targetable mutation via guardant 360 analysis.    2.  Androgen deprivation: He is currently receiving that under the care of Dr. Jeffie [name] which she will continue indefinitely.  3.  Tail of pancreas mass: Unclear etiology and has refused further management.  4.  Hypertension: Blood pressure is better at this time off Xtandi.  5.  Prognosis and goals of care: Therapy remains palliative although reasonable and aggressive measures are warranted given his overall maintain quality of  life.  6. Follow-up: In the next 2 to 3 months for repeat follow-up.  30 minutes were dedicated to this visit. The time was spent on reviewing his disease status, reviewing laboratory data, treatment options and addressing complications with his current therapy.  Zola Button, MD 7/20/20213:15 PM

## 2020-04-14 LAB — PROSTATE-SPECIFIC AG, SERUM (LABCORP): Prostate Specific Ag, Serum: 37.3 ng/mL — ABNORMAL HIGH (ref 0.0–4.0)

## 2020-04-20 ENCOUNTER — Ambulatory Visit: Payer: Medicare HMO | Admitting: General Practice

## 2020-04-20 ENCOUNTER — Telehealth (HOSPITAL_COMMUNITY): Payer: Self-pay | Admitting: Physician Assistant

## 2020-04-20 NOTE — Telephone Encounter (Signed)
Patient cancelled his Myoview for 04/26/2020 and I called patient to see if he was ready to reschedule and he declined to do so at this time.  He states he had a hard month and just doesent want to have at this time. He said he may decide at a later date to reschedule and if so he will call to do so. Order will be removed from the [facility] and if he calls back we can reinstate the order. Thank you.

## 2020-04-22 ENCOUNTER — Telehealth: Payer: Self-pay | Admitting: Oncology

## 2020-04-22 NOTE — Telephone Encounter (Signed)
Scheduled per 07/20 los, patient has been called and notified. 

## 2020-04-26 ENCOUNTER — Encounter (HOSPITAL_COMMUNITY): Payer: Medicare HMO

## 2020-06-22 ENCOUNTER — Other Ambulatory Visit: Payer: Self-pay

## 2020-06-22 ENCOUNTER — Inpatient Hospital Stay: Payer: Medicare HMO | Attending: Oncology

## 2020-06-22 DIAGNOSIS — C61 Malignant neoplasm of prostate: Secondary | ICD-10-CM

## 2020-07-06 ENCOUNTER — Encounter: Payer: Self-pay | Admitting: Oncology

## 2020-07-06 LAB — GUARDANT 360

## 2020-07-08 ENCOUNTER — Telehealth: Payer: Self-pay

## 2020-07-08 ENCOUNTER — Other Ambulatory Visit: Payer: Self-pay

## 2020-07-08 ENCOUNTER — Inpatient Hospital Stay: Payer: Medicare HMO | Attending: Oncology | Admitting: Oncology

## 2020-07-08 ENCOUNTER — Telehealth: Payer: Self-pay | Admitting: Pharmacist

## 2020-07-08 VITALS — BP 156/68 | HR 75 | Temp 97.4°F | Resp 18 | Ht 68.0 in | Wt 211.2 lb

## 2020-07-08 DIAGNOSIS — R590 Localized enlarged lymph nodes: Secondary | ICD-10-CM | POA: Diagnosis not present

## 2020-07-08 DIAGNOSIS — C61 Malignant neoplasm of prostate: Secondary | ICD-10-CM | POA: Insufficient documentation

## 2020-07-08 DIAGNOSIS — Z79899 Other long term (current) drug therapy: Secondary | ICD-10-CM | POA: Diagnosis not present

## 2020-07-08 DIAGNOSIS — N289 Disorder of kidney and ureter, unspecified: Secondary | ICD-10-CM | POA: Diagnosis not present

## 2020-07-08 DIAGNOSIS — Z791 Long term (current) use of non-steroidal anti-inflammatories (NSAID): Secondary | ICD-10-CM | POA: Insufficient documentation

## 2020-07-08 MED ORDER — OLAPARIB 100 MG PO TABS: 200.0000 mg | ORAL_TABLET | Freq: Two times a day (BID) | ORAL | 0 refills | Status: DC

## 2020-07-08 MED FILL — LYNPARZA 100 MG TAB: 100 | 30 days supply | Qty: 120 | Fill #0

## 2020-07-08 NOTE — Telephone Encounter (Signed)
Oral Oncology Patient Advocate Encounter  Prior Authorization for [name] [name] has been approved.    PA# [id number] Effective dates: 04/09/20 through 07/08/23  Patients co-pay is $155  Oral Oncology Clinic will continue to follow.   [facility] [place] Phone [phone] Fax [phone] 07/08/2020 11:59 AM

## 2020-07-08 NOTE — Telephone Encounter (Signed)
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  Counseled patient on administration, dosing, side effects, monitoring, drug-food interactions, safe handling, storage, and disposal.  Patient will take Lynparza $RemoveBefor'100mg'EtOdxmlLQiza$  tablets, 2 tablets ($RemoveBe'200mg'bEfLxkVYh$ ) by mouth 2 times daily without regard to food.  Patient counseled that administering with food may increase tolerability.  Patient knows to avoid grapefruit or grapefruit juice while on therapy with Lynparza.  [name] [name] start date: 07/09/20   Adverse effects include but are not limited to: nausea, vomiting, diarrhea, taste changes, mouth sores, fatigue, constipation, decreased blood counts, and joint pain. Patient informed that pneumonitis (including some fatalities) has occurred rarely. Myelodysplastic syndrome/acute myeloid leukemia (MDS/AML) have been reported (rarely) in clinical trials.  Patient has anti-emetic on hand and knows to take it if nausea develops.   Patient will obtain anti diarrheal and alert the office of 4 or more loose stools above baseline.  Reviewed with patient importance of keeping a medication schedule and plan for any missed doses. No barriers to medication adherence identified.  Medication reconciliation performed and medication/allergy list updated.  Insurance authorization for [name] [name] has been obtained. Fatima Sanger obtained for patient to make patient's out of pocket payment $0 for 1st fill of Lynparza. Patient will pick up from the [facility] on 07/08/20  Patient informed the pharmacy will reach out 5-7 days prior to needing next fill of Lynparza to coordinate continued medication acquisition to prevent break in therapy.  All questions answered.  [name] [name]  voiced understanding and appreciation.   Medication education handout placed in mail for patient. Patient knows to call the office with questions or concerns. Oral Chemotherapy Clinic phone number provided to patient.   Leron Croak, PharmD, BCPS Hematology/Oncology Clinical Pharmacist [facility] [phone] 07/08/2020 3:07 PM

## 2020-07-08 NOTE — Progress Notes (Signed)
Hematology and Oncology Follow Up Visit  [name] [name] [id number] 1932-07-24 86 y.o. 07/08/2020 9:50 AM [name], [name], MD    Principle Diagnosis: 87 year old man with advanced prostate cancer with lymphadenopathy diagnosed in 1898.  He developed castration-resistant prostate 16.     Prior Therapy: He was started on firmagon in October 2014.   His PSA started to rise up to 1.91 in June 2016 after a rise from 0.78 in March 2016.PSA was 0.53 in November 2015.   Xtandi 160 mg daily started in August 2016.  Therapy interrupted until March 2018 because of poor tolerance.  Xtandi 40 mg daily started in July 2018. His dose was increased to 160 mg in March 2021.  Therapy withheld in April 2021.  Current therapy:    He is receiving Lupron every 3 months under the care of Dr. Jeffie Pollock.  Under consideration for different salvage therapy.  Interim History: [name] [name] presents today for a follow-up visit.  Since the last visit, he has not reported any major medical changes in his health.  Does report chronic fatigue and tiredness but no bone pain or pathological fractures.  He was evaluated by Dr. Jeffie Pollock and underwent CT scan on June 28, 2020 which showed a progressive retroperitoneal and pelvic adenopathy.  In addition the was noted to have a pancreatic mass liver lesion.  He denies any abdominal pain at this time weight loss.  His appetite has not changed dramatically.  His performance status is marginal but not dramatically different.  Medications: Updated on review. Current Outpatient Medications  Medication Sig Dispense Refill  . acetaminophen (TYLENOL) 325 MG tablet Take 2 tablets (650 mg total) by mouth every 6 (six) hours as needed for mild pain (or Fever >/= 101). 30 tablet 0  . albuterol (VENTOLIN HFA) 108 (90 Base) MCG/ACT inhaler Inhale 2 puffs into the lungs every 6 (six) hours as needed for wheezing or shortness of breath.    Marland Kitchen amoxicillin (AMOXIL) 500 MG capsule Take  1 capsule (500 mg total) by mouth every 8 (eight) hours. 42 capsule 0  . carvedilol (COREG) 3.125 MG tablet Take 1 tablet (3.125 mg total) by mouth 2 (two) times daily with a meal. 60 tablet 0  . diazepam (VALIUM) 10 MG tablet Take 10 mg by mouth once as needed (one hour prior to procedure).     . fexofenadine (ALLEGRA) 180 MG tablet Take 180 mg by mouth daily.    . fluticasone (FLONASE) 50 MCG/ACT nasal spray Place 2 sprays into both nostrils daily as needed for allergies or rhinitis.    Marland Kitchen levothyroxine (SYNTHROID, LEVOTHROID) 150 MCG tablet Take 150 mcg by mouth daily before breakfast.    . meloxicam (MOBIC) 15 MG tablet Take 15 mg by mouth daily.    . Multiple Vitamin (MULTIVITAMIN WITH MINERALS) TABS tablet Take 1 tablet by mouth 2 (two) times a week.     Marland Kitchen MYRBETRIQ 50 MG TB24 tablet Take 50 mg by mouth daily.   0  . ondansetron (ZOFRAN) 4 MG tablet Take 1 tablet (4 mg total) by mouth every 6 (six) hours as needed for nausea. 20 tablet 0  . oxyCODONE-acetaminophen (PERCOCET/ROXICET) 5-325 MG tablet Take 1 tablet by mouth every 6 (six) hours as needed (for pain).     . polyethylene glycol (MIRALAX / GLYCOLAX) 17 g packet Take 17 g by mouth daily as needed for mild constipation. 14 each 0  . traZODone (DESYREL) 50 MG tablet Take 1 tablet (50 mg total)  by mouth at bedtime as needed for sleep. 30 tablet 0   No current facility-administered medications for this visit.     Allergies: No Known Allergies     Physical Exam:   Blood pressure (!) 156/68, pulse 75, temperature (!) 97.4 F (36.3 C), temperature source Tympanic, resp. rate 18, height $RemoveBe'5\' 8"'aQanpOoTp$  (1.727 m), weight 211 lb 3.2 oz (95.8 kg), SpO2 96 %.     ECOG: 1    General appearance: Comfortable appearing without any discomfort Head: Normocephalic without any trauma Oropharynx: Mucous membranes are moist and pink without any thrush or ulcers. Eyes: Pupils are equal and round reactive to light. Lymph nodes: No cervical,  supraclavicular, inguinal or axillary lymphadenopathy.   Heart:regular rate and rhythm.  S1 and S2 without leg edema. Lung: Clear without any rhonchi or wheezes.  No dullness to percussion. Abdomin: Soft, nontender, nondistended with good bowel sounds.  No hepatosplenomegaly. Musculoskeletal: No joint deformity or effusion.  Full range of motion noted. Neurological: No deficits noted on motor, sensory and deep tendon reflex exam. Skin: No petechial rash or dryness.  Appeared moist.        Lab Results: Lab Results  Component Value Date   WBC 6.0 04/13/2020   HGB 10.0 (L) 04/13/2020   HCT 31.1 (L) 04/13/2020   MCV 98.1 04/13/2020   PLT 154 04/13/2020     Chemistry      Component Value Date/Time   NA 140 04/13/2020 1508   NA 140 06/05/2017 1443   K 4.5 04/13/2020 1508   K 4.6 06/05/2017 1443   CL 105 04/13/2020 1508   CO2 25 04/13/2020 1508   CO2 24 06/05/2017 1443   BUN 26 (H) 04/13/2020 1508   BUN 22.7 06/05/2017 1443   CREATININE 1.19 04/13/2020 1508   CREATININE 1.2 06/05/2017 1443      Component Value Date/Time   CALCIUM 9.5 04/13/2020 1508   CALCIUM 9.4 06/05/2017 1443   ALKPHOS 113 04/13/2020 1508   ALKPHOS 66 06/05/2017 1443   AST 17 04/13/2020 1508   AST 17 06/05/2017 1443   ALT 14 04/13/2020 1508   ALT 15 06/05/2017 1443   BILITOT 0.4 04/13/2020 1508   BILITOT 0.41 06/05/2017 1443     Results for [name] [name], [name] "BILL" (MRN [id number]) as of 07/08/2020 09:22  Ref. Range 10/14/2019 09:22 01/13/2020 09:40 04/13/2020 15:08  Prostate Specific Ag, Serum Latest Ref Range: 0.0 - 4.0 ng/mL 17.7 (H) 34.2 (H) 37.3 (H)    4.6 cm hypoenhancing mass in the pancreatic tail, compatible with primary pancreatic neoplasm, progressive.  New hepatic metastases. Progressive retroperitoneal/pelvic nodal metastases. Mildly progressive lytic metastasis in the left parasymphyseal region.  Severe right hydroureteronephrosis, unchanged. No obstructing ureteral calculus is  evident on the current study and therefore the underlying etiology is unclear.  Additional bilateral nonobstructing renal calculi measuring up to 4 mm. Additional 4 mm calculus layering in the distal right ureter, nonobstructive. Brachytherapy seeds in the prostate.  Healing right lateral 9th and 10th rib fractures.  These results will be called to the ordering clinician or representative by the Radiologist Assistant, and communication documented in the PACS or [employer].  Impression and Plan:   86 year old man with:  1.  Castration-resistant prostate cancer with lymphadenopathy documented since 2016.    The natural course of his disease was reviewed today and treatment options were discussed.  Guardant 360 analysis as revealed a CHEK2 mutation which makes olaparib a potential treatment option.  Risks and benefits of treating  with this medication were discussed.  Complications with nausea, vomiting, myelosuppression, renal insufficiency among others.  He understands that this treatment is mainly palliative and might have some impact on his prostate cancer but likely will have no impact if he has pancreatic cancer.  Alternative treatment options would be palliative care and hospice enrollment.  After discussion today, he is willing to try olaparib for the time being with the caveat that we will discontinue this medication if he has poor tolerance with decline in his quality of life.    2.  Androgen deprivation: He is receiving it under the care of Dr. Jeffie Pollock which will be continued for the time being.  3.  Tail of pancreas mass: Suspicious for malignancy and metastatic disease to the liver.  I recommended obtaining tissue biopsy at least from his hepatic metastasis to determine whether he has a second primary.  He would not be a good candidate for any aggressive chemotherapy for pancreatic cancer which will offer very little palliation.  For the time being he has declined  obtaining tissue biopsy knowing that if this is pancreatic cancer would likely progress and result in further decline in his quality of life and necessitating hospice enrollment.  4.  Prognosis and goals of care: This was discussed today in detail and overall prognosis was discussed.  I feel that is a prognosis is poor and has limited life expectancy especially if we are dealing with advanced pancreatic cancer.  I have urged him to finish any advanced directives prior he experienced any further decline which is inevitably coming.  6. Follow-up: Will return in 6 weeks for repeat evaluation.  30 minutes were spent on this encounter.  Time was dedicated to reviewing imaging studies, discussing laboratory data and outlining future plan of care.  Zola Button, MD 10/14/20219:50 AM

## 2020-07-08 NOTE — Telephone Encounter (Signed)
Oral Oncology Patient Advocate Encounter  Was successful in securing patient a $8000 grant from [employer] to provide copayment coverage for [employer].  This will keep the out of pocket expense at $0.     [employer] ID: [id number]  I have spoken with the patient.   The billing information is as follows and has been shared with [facility]: [id number] PCN: [id number] Member ID: [id number] Group ID: [id number] Dates of Eligibility: 06/08/20 through 06/07/21  Fund:  prostate  [place] Patient [place] Phone [phone] Fax [phone] 07/08/2020 11:56 AM

## 2020-07-08 NOTE — Telephone Encounter (Signed)
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  Prescription dose and frequency assessed for appropriateness. Appropriate for therapy initiation.   CBC w/ Diff and CMP from 04/13/20 assessed, noted Scr of 1.19 mg/dL (CrCl ~60 mL/min) - no dose adjustments required.  Current medication list in Epic reviewed, no relevant/significant DDIs with [place] identified.  Evaluated chart and no patient barriers to medication adherence noted.   Prescription has been e-scribed to the [facility] for benefits analysis and approval.  Oral Oncology Clinic will continue to follow for insurance authorization, copayment issues, initial counseling and start date.  Leron Croak, PharmD, BCPS Hematology/Oncology Clinical Pharmacist [facility] [phone] 07/08/2020 11:53 AM

## 2020-07-08 NOTE — Telephone Encounter (Signed)
Oral Oncology Patient Advocate Encounter  Received notification from [place] that prior authorization for [facility] [place] is required.  PA submitted on CoverMyMeds Key [id number] Status is pending  Oral Oncology Clinic will continue to follow.  [place] Patient [place] Phone [phone] Fax [phone] 07/08/2020 11:07 AM

## 2020-07-14 ENCOUNTER — Telehealth: Payer: Self-pay

## 2020-07-14 NOTE — Telephone Encounter (Signed)
Patient called with questions with medication directions for Lynparza. Gave patient instructions per Dr Alen Blew prescriptions from 07/08/20. Patient states he had only been taking one pill and will increase to two pills a day.

## 2020-07-28 ENCOUNTER — Other Ambulatory Visit: Payer: Self-pay | Admitting: Oncology

## 2020-08-05 MED FILL — LYNPARZA 100 MG TAB: 100 | 30 days supply | Qty: 120 | Fill #0

## 2020-08-17 ENCOUNTER — Inpatient Hospital Stay: Payer: Medicare HMO | Attending: Oncology

## 2020-08-17 ENCOUNTER — Other Ambulatory Visit: Payer: Self-pay

## 2020-08-17 ENCOUNTER — Inpatient Hospital Stay (HOSPITAL_BASED_OUTPATIENT_CLINIC_OR_DEPARTMENT_OTHER): Payer: Medicare HMO | Admitting: Oncology

## 2020-08-17 VITALS — BP 157/72 | HR 77 | Temp 97.7°F | Resp 18 | Ht 68.0 in | Wt 216.3 lb

## 2020-08-17 DIAGNOSIS — C787 Secondary malignant neoplasm of liver and intrahepatic bile duct: Secondary | ICD-10-CM | POA: Insufficient documentation

## 2020-08-17 DIAGNOSIS — C61 Malignant neoplasm of prostate: Secondary | ICD-10-CM | POA: Diagnosis present

## 2020-08-17 DIAGNOSIS — R591 Generalized enlarged lymph nodes: Secondary | ICD-10-CM | POA: Diagnosis not present

## 2020-08-17 DIAGNOSIS — K869 Disease of pancreas, unspecified: Secondary | ICD-10-CM | POA: Diagnosis not present

## 2020-08-17 DIAGNOSIS — Z79899 Other long term (current) drug therapy: Secondary | ICD-10-CM | POA: Insufficient documentation

## 2020-08-17 LAB — CBC WITH DIFFERENTIAL (CANCER CENTER ONLY)
Abs Immature Granulocytes: 0.03 10*3/uL (ref 0.00–0.07)
Basophils Absolute: 0 10*3/uL (ref 0.0–0.1)
Basophils Relative: 1 %
Eosinophils Absolute: 0.2 10*3/uL (ref 0.0–0.5)
Eosinophils Relative: 3 %
HCT: 35.6 % — ABNORMAL LOW (ref 39.0–52.0)
Hemoglobin: 11.3 g/dL — ABNORMAL LOW (ref 13.0–17.0)
Immature Granulocytes: 1 %
Lymphocytes Relative: 24 %
Lymphs Abs: 1.3 10*3/uL (ref 0.7–4.0)
MCH: 31.1 pg (ref 26.0–34.0)
MCHC: 31.7 g/dL (ref 30.0–36.0)
MCV: 98.1 fL (ref 80.0–100.0)
Monocytes Absolute: 0.5 10*3/uL (ref 0.1–1.0)
Monocytes Relative: 9 %
Neutro Abs: 3.4 10*3/uL (ref 1.7–7.7)
Neutrophils Relative %: 62 %
Platelet Count: 114 10*3/uL — ABNORMAL LOW (ref 150–400)
RBC: 3.63 MIL/uL — ABNORMAL LOW (ref 4.22–5.81)
RDW: 15.2 % (ref 11.5–15.5)
WBC Count: 5.4 10*3/uL (ref 4.0–10.5)
nRBC: 0 % (ref 0.0–0.2)

## 2020-08-17 LAB — CMP (CANCER CENTER ONLY)
ALT: 11 U/L (ref 0–44)
AST: 16 U/L (ref 15–41)
Albumin: 4 g/dL (ref 3.5–5.0)
Alkaline Phosphatase: 76 U/L (ref 38–126)
Anion gap: 10 (ref 5–15)
BUN: 26 mg/dL — ABNORMAL HIGH (ref 8–23)
CO2: 27 mmol/L (ref 22–32)
Calcium: 9.4 mg/dL (ref 8.9–10.3)
Chloride: 103 mmol/L (ref 98–111)
Creatinine: 1.48 mg/dL — ABNORMAL HIGH (ref 0.61–1.24)
GFR, Estimated: 46 mL/min — ABNORMAL LOW (ref >60)
Glucose, Bld: 101 mg/dL — ABNORMAL HIGH (ref 70–99)
Potassium: 4.2 mmol/L (ref 3.5–5.1)
Sodium: 140 mmol/L (ref 135–145)
Total Bilirubin: 0.5 mg/dL (ref 0.3–1.2)
Total Protein: 6.9 g/dL (ref 6.5–8.1)

## 2020-08-17 NOTE — Progress Notes (Signed)
Hematology and Oncology Follow Up Visit  [name] [name] [id number] 07/27/1932 86 y.o. 08/17/2020 2:53 PM [name], Robert, MDReade, Robert, MD    Principle Diagnosis: 86-year-old man with castration-resistant prostate cancer documented in 2016.  He was initially diagnosed with breast disease in 2014.  He has documented metastatic disease to the liver with CHEK2 mutation noted on his next generation sequencing.   Prior Therapy: He was started on firmagon in October 2014.   His PSA started to rise up to 1.91 in June 2016 after a rise from 0.78 in March 2016.PSA was 0.53 in November 2015.   Xtandi 160 mg daily started in August 2016.  Therapy interrupted until March 2018 because of poor tolerance.  Xtandi 40 mg daily started in July 2018. His dose was increased to 160 mg in March 2021.  Therapy withheld in April 2021.  Current therapy:    He is receiving Lupron every 3 months under the care of Dr. Wrenn.  Lynparza 200 mg twice a day started on July 09, 2020.  Interim History: [name] [name] returns today for repeat follow-up.  Since the last visit, he reports no major changes in his health.  He has tolerated Lynparza without any new complaints.  He denies any nausea, vomiting or abdominal pain.  He does report some occasional constipation.  He denies any diarrhea or bone pain.  He denies any weight loss or appetite changes and has gained some weight since her last visit.  Medications: Unchanged on review. Current Outpatient Medications  Medication Sig Dispense Refill  . acetaminophen (TYLENOL) 325 MG tablet Take 2 tablets (650 mg total) by mouth every 6 (six) hours as needed for mild pain (or Fever >/= 101). 30 tablet 0  . albuterol (VENTOLIN HFA) 108 (90 Base) MCG/ACT inhaler Inhale 2 puffs into the lungs every 6 (six) hours as needed for wheezing or shortness of breath.    . amoxicillin (AMOXIL) 500 MG capsule Take 1 capsule (500 mg total) by mouth every 8 (eight) hours. 42 capsule 0  .  carvedilol (COREG) 3.125 MG tablet Take 1 tablet (3.125 mg total) by mouth 2 (two) times daily with a meal. 60 tablet 0  . diazepam (VALIUM) 10 MG tablet Take 10 mg by mouth once as needed (one hour prior to procedure).     . fexofenadine (ALLEGRA) 180 MG tablet Take 180 mg by mouth daily.    . fluticasone (FLONASE) 50 MCG/ACT nasal spray Place 2 sprays into both nostrils daily as needed for allergies or rhinitis.    . levothyroxine (SYNTHROID, LEVOTHROID) 150 MCG tablet Take 150 mcg by mouth daily before breakfast.    . LYNPARZA 100 MG tablet TAKE 2 TABLETS (200 MG TOTAL) BY MOUTH 2 (TWO) TIMES DAILY. SWALLOW WHOLE. MAY TAKE WITH FOOD TO DECREASE NAUSEA AND VOMITING. 120 tablet 0  . meloxicam (MOBIC) 15 MG tablet Take 15 mg by mouth daily.    . Multiple Vitamin (MULTIVITAMIN WITH MINERALS) TABS tablet Take 1 tablet by mouth 2 (two) times a week.     . MYRBETRIQ 50 MG TB24 tablet Take 50 mg by mouth daily.   0  . ondansetron (ZOFRAN) 4 MG tablet Take 1 tablet (4 mg total) by mouth every 6 (six) hours as needed for nausea. 20 tablet 0  . oxyCODONE-acetaminophen (PERCOCET/ROXICET) 5-325 MG tablet Take 1 tablet by mouth every 6 (six) hours as needed (for pain).     . polyethylene glycol (MIRALAX / GLYCOLAX) 17 g packet Take 17 g   by mouth daily as needed for mild constipation. 14 each 0  . traZODone (DESYREL) 50 MG tablet Take 1 tablet (50 mg total) by mouth at bedtime as needed for sleep. 30 tablet 0   No current facility-administered medications for this visit.     Allergies: No Known Allergies     Physical Exam:   Blood pressure (!) 157/72, pulse 77, temperature 97.7 F (36.5 C), temperature source Tympanic, resp. rate 18, height 5' 8" (1.727 m), weight 216 lb 4.8 oz (98.1 kg), SpO2 99 %.      ECOG: 1   General appearance: Alert, awake without any distress. Head: Atraumatic without abnormalities Oropharynx: Without any thrush or ulcers. Eyes: No scleral icterus. Lymph nodes: No  lymphadenopathy noted in the cervical, supraclavicular, or axillary nodes Heart:regular rate and rhythm, without any murmurs or gallops.   Lung: Clear to auscultation without any rhonchi, wheezes or dullness to percussion. Abdomin: Soft, nontender without any shifting dullness or ascites. Musculoskeletal: No clubbing or cyanosis. Neurological: No motor or sensory deficits. Skin: No rashes or lesions.        Lab Results: Lab Results  Component Value Date   WBC 6.0 04/13/2020   HGB 10.0 (L) 04/13/2020   HCT 31.1 (L) 04/13/2020   MCV 98.1 04/13/2020   PLT 154 04/13/2020     Chemistry      Component Value Date/Time   NA 140 04/13/2020 1508   NA 140 06/05/2017 1443   K 4.5 04/13/2020 1508   K 4.6 06/05/2017 1443   CL 105 04/13/2020 1508   CO2 25 04/13/2020 1508   CO2 24 06/05/2017 1443   BUN 26 (H) 04/13/2020 1508   BUN 22.7 06/05/2017 1443   CREATININE 1.19 04/13/2020 1508   CREATININE 1.2 06/05/2017 1443      Component Value Date/Time   CALCIUM 9.5 04/13/2020 1508   CALCIUM 9.4 06/05/2017 1443   ALKPHOS 113 04/13/2020 1508   ALKPHOS 66 06/05/2017 1443   AST 17 04/13/2020 1508   AST 17 06/05/2017 1443   ALT 14 04/13/2020 1508   ALT 15 06/05/2017 1443   BILITOT 0.4 04/13/2020 1508   BILITOT 0.41 06/05/2017 1443     Results for Wilmes, [name] J "BILL" (MRN [id number]) as of 08/17/2020 14:56  Ref. Range 10/14/2019 09:22 01/13/2020 09:40 04/13/2020 15:08  Prostate Specific Ag, Serum Latest Ref Range: 0.0 - 4.0 ng/mL 17.7 (H) 34.2 (H) 37.3 (H)    Impression and Plan:   86-year-old man with:  1.  Advanced prostate cancer with lymphadenopathy and hepatic metastasis that is currently castration-resistant.  He was initially documented to have advanced disease in 2016 with progression of disease in 2021.   He is a currently on Lynparza after next generation sequencing showed positive CHEK2 mutation.  Risks and benefits of continuing this treatment long-term were  reviewed potential complications were reiterated.  Alternative treatment options such as systemic chemotherapy were also discussed.  After discussion today, I have recommended continuing this medication with the same dose and schedule.  We will continue to monitor his PSA and make adjustments accordingly.    2.  Androgen deprivation: I recommended continuing this indefinitely.  He is currently receiving that under the care of Dr. Wrenn.  3.  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  4.  Prognosis and goals of care: Therapy remains palliative given his overall incurable malignancy.  Any treatment is palliative.  6. Follow-up:   30 minutes were dedicated   to this visit.  The time was spent on reviewing his disease status, discussing treatment options and future plan of care review.  Firas Shadad, MD 11/23/20212:53 PM 

## 2020-08-18 ENCOUNTER — Telehealth: Payer: Self-pay | Admitting: *Deleted

## 2020-08-18 LAB — PROSTATE-SPECIFIC AG, SERUM (LABCORP): Prostate Specific Ag, Serum: 42 ng/mL — ABNORMAL HIGH (ref 0.0–4.0)

## 2020-08-18 NOTE — Telephone Encounter (Signed)
-----   Message from Wyatt Portela, MD sent at 08/18/2020  8:43 AM EST ----- Please let him know his PSA is stabilizing.  No change in his medication needed.

## 2020-08-18 NOTE — Telephone Encounter (Signed)
Notified of message below

## 2020-09-02 ENCOUNTER — Other Ambulatory Visit: Payer: Self-pay | Admitting: Oncology

## 2020-09-02 MED FILL — LYNPARZA 100 MG TAB: 100 | 30 days supply | Qty: 120 | Fill #0

## 2020-09-15 ENCOUNTER — Encounter: Payer: Self-pay | Admitting: Urology

## 2020-09-27 ENCOUNTER — Other Ambulatory Visit: Payer: Self-pay | Admitting: Oncology

## 2020-09-30 ENCOUNTER — Other Ambulatory Visit: Payer: Self-pay

## 2020-09-30 ENCOUNTER — Inpatient Hospital Stay (HOSPITAL_BASED_OUTPATIENT_CLINIC_OR_DEPARTMENT_OTHER): Payer: Medicare HMO | Admitting: Oncology

## 2020-09-30 ENCOUNTER — Inpatient Hospital Stay: Payer: Medicare HMO | Attending: Oncology

## 2020-09-30 VITALS — BP 167/55 | HR 69 | Temp 97.9°F | Resp 16 | Ht 68.0 in | Wt 212.1 lb

## 2020-09-30 DIAGNOSIS — K869 Disease of pancreas, unspecified: Secondary | ICD-10-CM | POA: Insufficient documentation

## 2020-09-30 DIAGNOSIS — C61 Malignant neoplasm of prostate: Secondary | ICD-10-CM | POA: Diagnosis present

## 2020-09-30 DIAGNOSIS — Z79899 Other long term (current) drug therapy: Secondary | ICD-10-CM | POA: Insufficient documentation

## 2020-09-30 DIAGNOSIS — C787 Secondary malignant neoplasm of liver and intrahepatic bile duct: Secondary | ICD-10-CM | POA: Insufficient documentation

## 2020-09-30 LAB — CBC WITH DIFFERENTIAL (CANCER CENTER ONLY)
Abs Immature Granulocytes: 0.01 10*3/uL (ref 0.00–0.07)
Basophils Absolute: 0 10*3/uL (ref 0.0–0.1)
Basophils Relative: 1 %
Eosinophils Absolute: 0.1 10*3/uL (ref 0.0–0.5)
Eosinophils Relative: 2 %
HCT: 32.8 % — ABNORMAL LOW (ref 39.0–52.0)
Hemoglobin: 10.6 g/dL — ABNORMAL LOW (ref 13.0–17.0)
Immature Granulocytes: 0 %
Lymphocytes Relative: 22 %
Lymphs Abs: 1.2 10*3/uL (ref 0.7–4.0)
MCH: 32.2 pg (ref 26.0–34.0)
MCHC: 32.3 g/dL (ref 30.0–36.0)
MCV: 99.7 fL (ref 80.0–100.0)
Monocytes Absolute: 0.5 10*3/uL (ref 0.1–1.0)
Monocytes Relative: 9 %
Neutro Abs: 3.8 10*3/uL (ref 1.7–7.7)
Neutrophils Relative %: 66 %
Platelet Count: 151 10*3/uL (ref 150–400)
RBC: 3.29 MIL/uL — ABNORMAL LOW (ref 4.22–5.81)
RDW: 16.2 % — ABNORMAL HIGH (ref 11.5–15.5)
WBC Count: 5.8 10*3/uL (ref 4.0–10.5)
nRBC: 0 % (ref 0.0–0.2)

## 2020-09-30 LAB — CMP (CANCER CENTER ONLY)
ALT: 10 U/L (ref 0–44)
AST: 15 U/L (ref 15–41)
Albumin: 3.8 g/dL (ref 3.5–5.0)
Alkaline Phosphatase: 86 U/L (ref 38–126)
Anion gap: 8 (ref 5–15)
BUN: 22 mg/dL (ref 8–23)
CO2: 23 mmol/L (ref 22–32)
Calcium: 9.1 mg/dL (ref 8.9–10.3)
Chloride: 104 mmol/L (ref 98–111)
Creatinine: 1.27 mg/dL — ABNORMAL HIGH (ref 0.61–1.24)
GFR, Estimated: 55 mL/min — ABNORMAL LOW (ref >60)
Glucose, Bld: 106 mg/dL — ABNORMAL HIGH (ref 70–99)
Potassium: 4.8 mmol/L (ref 3.5–5.1)
Sodium: 135 mmol/L (ref 135–145)
Total Bilirubin: 0.6 mg/dL (ref 0.3–1.2)
Total Protein: 7 g/dL (ref 6.5–8.1)

## 2020-09-30 MED FILL — LYNPARZA 100 MG TAB: 100 | 30 days supply | Qty: 120 | Fill #0

## 2020-09-30 NOTE — Progress Notes (Signed)
Hematology and Oncology Follow Up Visit  [name] [name] [id number] May 08, 1932 87 y.o. 09/30/2020 2:15 PM [name] [name], [name] [place], MD    Principle Diagnosis: 87 year old man with advanced prostate cancer with hepatic metastasis documented in October 2021.  He has castration-resistant with CHEK2 mutation noted on liquid biopsy.  Prior Therapy: He was started on firmagon in October 2014.   His PSA started to rise up to 1.91 in June 2016 after a rise from 0.78 in March 2016.PSA was 0.53 in November 2015.   Xtandi 160 mg daily started in August 2016.  Therapy interrupted until March 2018 because of poor tolerance.  Xtandi 40 mg daily started in July 2018. His dose was increased to 160 mg in March 2021.  Therapy withheld in April 2021.  Current therapy:    He is receiving Lupron every 3 months under the care of Dr. Jeffie [name].  Lynparza 200 mg twice a day started on July 09, 2020.  Interim History: [name] [name] is here for return evaluation.  Since her last visit, he reports no major changes in his health.  He continues to tolerate [place] without any new complaints.  He denies any nausea, vomiting or abdominal pain.  He has not reported any further decline in his performance status.  His quality of life remains marginal but not declining.  He does use a walker without any falls or syncope.  He denies any worsening abdominal pain, back pain or appetite changes.  He has lost few pounds but overall eating well.  Medications: Unchanged on review. Current Outpatient Medications  Medication Sig Dispense Refill  . acetaminophen (TYLENOL) 325 MG tablet Take 2 tablets (650 mg total) by mouth every 6 (six) hours as needed for mild pain (or Fever >/= 101). 30 tablet 0  . albuterol (VENTOLIN HFA) 108 (90 Base) MCG/ACT inhaler Inhale 2 puffs into the lungs every 6 (six) hours as needed for wheezing or shortness of breath.    Marland Kitchen amoxicillin (AMOXIL) 500 MG capsule Take 1 capsule (500 mg total) by mouth  every 8 (eight) hours. 42 capsule 0  . carvedilol (COREG) 3.125 MG tablet Take 1 tablet (3.125 mg total) by mouth 2 (two) times daily with a meal. 60 tablet 0  . diazepam (VALIUM) 10 MG tablet Take 10 mg by mouth once as needed (one hour prior to procedure).     . fexofenadine (ALLEGRA) 180 MG tablet Take 180 mg by mouth daily.    . fluticasone (FLONASE) 50 MCG/ACT nasal spray Place 2 sprays into both nostrils daily as needed for allergies or rhinitis.    Marland Kitchen levothyroxine (SYNTHROID, LEVOTHROID) 150 MCG tablet Take 150 mcg by mouth daily before breakfast.    . LYNPARZA 100 MG tablet TAKE 2 TABLETS (200 MG TOTAL) BY MOUTH 2 (TWO) TIMES DAILY. SWALLOW WHOLE. MAY TAKE WITH FOOD TO DECREASE NAUSEA AND VOMITING. 120 tablet 0  . meloxicam (MOBIC) 15 MG tablet Take 15 mg by mouth daily.    . Multiple Vitamin (MULTIVITAMIN WITH MINERALS) TABS tablet Take 1 tablet by mouth 2 (two) times a week.     Marland Kitchen MYRBETRIQ 50 MG TB24 tablet Take 50 mg by mouth daily.   0  . ondansetron (ZOFRAN) 4 MG tablet Take 1 tablet (4 mg total) by mouth every 6 (six) hours as needed for nausea. 20 tablet 0  . oxyCODONE-acetaminophen (PERCOCET/ROXICET) 5-325 MG tablet Take 1 tablet by mouth every 6 (six) hours as needed (for pain).     . polyethylene glycol (  MIRALAX / GLYCOLAX) 17 g packet Take 17 g by mouth daily as needed for mild constipation. 14 each 0  . traZODone (DESYREL) 50 MG tablet Take 1 tablet (50 mg total) by mouth at bedtime as needed for sleep. 30 tablet 0   No current facility-administered medications for this visit.     Allergies: No Known Allergies     Physical Exam:   Blood pressure (!) 167/55, pulse 69, temperature 97.9 F (36.6 C), temperature source Tympanic, resp. rate 16, height $RemoveBe'5\' 8"'hScrkfLpv$  (1.727 m), weight 212 lb 1.6 oz (96.2 kg), SpO2 97 %.       ECOG: 1    General appearance: Comfortable appearing without any discomfort Head: Normocephalic without any trauma Oropharynx: Mucous membranes  are moist and pink without any thrush or ulcers. Eyes: Pupils are equal and round reactive to light. Lymph nodes: No cervical, supraclavicular, inguinal or axillary lymphadenopathy.   Heart:regular rate and rhythm.  S1 and S2 without leg edema. Lung: Clear without any rhonchi or wheezes.  No dullness to percussion. Abdomin: Soft, nontender, nondistended with good bowel sounds.  No hepatosplenomegaly. Musculoskeletal: No joint deformity or effusion.  Full range of motion noted. Neurological: No deficits noted on motor, sensory and deep tendon reflex exam. Skin: No petechial rash or dryness.  Appeared moist.         Lab Results: Lab Results  Component Value Date   WBC 5.4 08/17/2020   HGB 11.3 (L) 08/17/2020   HCT 35.6 (L) 08/17/2020   MCV 98.1 08/17/2020   PLT 114 (L) 08/17/2020     Chemistry      Component Value Date/Time   NA 140 08/17/2020 1439   NA 140 06/05/2017 1443   K 4.2 08/17/2020 1439   K 4.6 06/05/2017 1443   CL 103 08/17/2020 1439   CO2 27 08/17/2020 1439   CO2 24 06/05/2017 1443   BUN 26 (H) 08/17/2020 1439   BUN 22.7 06/05/2017 1443   CREATININE 1.48 (H) 08/17/2020 1439   CREATININE 1.2 06/05/2017 1443      Component Value Date/Time   CALCIUM 9.4 08/17/2020 1439   CALCIUM 9.4 06/05/2017 1443   ALKPHOS 76 08/17/2020 1439   ALKPHOS 66 06/05/2017 1443   AST 16 08/17/2020 1439   AST 17 06/05/2017 1443   ALT 11 08/17/2020 1439   ALT 15 06/05/2017 1443   BILITOT 0.5 08/17/2020 1439   BILITOT 0.41 06/05/2017 1443      Results for DVAUGHN, FICKLE "BILL" (MRN [id number]) as of 09/30/2020 14:17  Ref. Range 04/13/2020 15:08 08/17/2020 14:39  Prostate Specific Ag, Serum Latest Ref Range: 0.0 - 4.0 ng/mL 37.3 (H) 42.0 (H)    Impression and Plan:   87 year old man with:  1.  Castration-resistant advanced prostate cancer with hepatic metastasis documented in 2021.     He continues to tolerate [place] without any major complications.  His PSA in  November 2021 showed overall disease stabilization without any rapid increase at this time.  Risks and benefits of continuing this treatment versus therapy discontinuation and proceeding with supportive care or hospice at that time.  Overall, he is maintaining reasonable quality of life with clinical benefit from this treatment.  I recommended updating his staging scans before the next visit to assess his response.  He is agreeable to proceed with.    2.  Androgen deprivation: He continues to receive that under the care of Dr. Jeffie [name].  I recommended continuing this indefinitely.  3.  Tail of pancreas  mass: Potentially different malignancy but he has declined work-up at this time.  4.  Prognosis and goals of care: His disease is incurable although aggressive measures are warranted for the time being but he understands if he has further decline then no aggressive measures are warranted and transitioning to hospice would be recommended.  6. Follow-up: In 8 weeks for repeat follow-up.  30 minutes were spent on this encounter.  The time was dedicated to reviewing his disease status, laboratory data review and outlining future plan of care discussion.  Zola Button, MD 1/6/20222:15 PM

## 2020-10-01 LAB — PROSTATE-SPECIFIC AG, SERUM (LABCORP): Prostate Specific Ag, Serum: 55.8 ng/mL — ABNORMAL HIGH (ref 0.0–4.0)

## 2020-10-04 ENCOUNTER — Telehealth: Payer: Self-pay

## 2020-10-04 NOTE — Telephone Encounter (Signed)
Oral Oncology Patient Advocate Encounter   Was successful in securing patient a $[id number] grant from [employer] to provide copayment coverage for Lynparza.  This will keep the out of pocket expense at $0.      The billing information is as follows and has been shared with [place].   Member ID: [id number] Group ID: [id number] RxBin: [id number] PCN: [id number] Dates of Eligibility: 09/29/20 through 09/29/21  Fund name:  Prostate.   [place] Patient [place] Phone [phone] Fax [phone] 10/04/2020 11:50 AM

## 2020-10-04 NOTE — Telephone Encounter (Signed)
Oral Oncology Patient Advocate Encounter   Was successful in securing patient a $3500 grant from [facility] [place] (PAF) to provide copayment coverage for Lynparza.  This will keep the out of pocket expense at $0.     I have spoken with the patient.    The billing information is as follows and has been shared with [place]: [id number] PCN:  [id number] Member ID: [id number]  Group ID: [id number] Dates of Eligibility: 09/30/20 through 09/30/21  [place] [facility] [place] Phone [phone] Fax [phone] 10/04/2020 9:49 AM

## 2020-10-25 ENCOUNTER — Other Ambulatory Visit: Payer: Self-pay | Admitting: Oncology

## 2020-10-28 MED FILL — LYNPARZA 100 MG TAB: 100 | 30 days supply | Qty: 120 | Fill #0

## 2020-11-22 ENCOUNTER — Other Ambulatory Visit: Payer: Self-pay | Admitting: Oncology

## 2020-11-25 ENCOUNTER — Telehealth: Payer: Self-pay

## 2020-11-25 NOTE — Telephone Encounter (Signed)
-----   Message from Wyatt Portela, MD sent at 11/25/2020 10:36 AM EST ----- I am aware.  If he is able to get the scan, that would be great.  If he is unable, then will cancel without a problem.  Thanks ----- Message ----- From: Tami Lin, RN Sent: 11/25/2020  10:34 AM EST To: Wyatt Portela, MD  Patient called and wants to let you know he has concerns about the CT scan scheduled on Monday.  He says he has diarrhea and has difficulty getting on the table for the scan. He said if you absolutely want him to have the scan he will try and come but he says he is getting weaker by the day. I asked him if he wanted to cancel the scan and he said he just wanted to make you aware of his concerns.  Lanelle Bal

## 2020-11-25 NOTE — Telephone Encounter (Signed)
Called and made patient aware of Dr. Hazeline Junker response. Patient verbalized understanding and said he will try and make it to the CT scan and if he can't he will call Monday morning to cancel.

## 2020-11-29 ENCOUNTER — Other Ambulatory Visit: Payer: Self-pay

## 2020-11-29 ENCOUNTER — Inpatient Hospital Stay: Payer: Medicare HMO | Attending: Oncology

## 2020-11-29 ENCOUNTER — Ambulatory Visit (HOSPITAL_COMMUNITY)
Admission: RE | Admit: 2020-11-29 | Discharge: 2020-11-29 | Disposition: A | Discharge status: Home / Self Care | Payer: Medicare HMO | Source: Ambulatory Visit | Attending: Oncology | Admitting: Oncology

## 2020-11-29 DIAGNOSIS — C787 Secondary malignant neoplasm of liver and intrahepatic bile duct: Secondary | ICD-10-CM | POA: Insufficient documentation

## 2020-11-29 DIAGNOSIS — C61 Malignant neoplasm of prostate: Secondary | ICD-10-CM | POA: Insufficient documentation

## 2020-11-29 DIAGNOSIS — M7989 Other specified soft tissue disorders: Secondary | ICD-10-CM | POA: Insufficient documentation

## 2020-11-29 DIAGNOSIS — Z79899 Other long term (current) drug therapy: Secondary | ICD-10-CM | POA: Insufficient documentation

## 2020-11-29 LAB — POCT I-STAT CREATININE: Creatinine, Ser: 1.5 mg/dL — ABNORMAL HIGH (ref 0.61–1.24)

## 2020-11-29 MED ORDER — IOHEXOL 300 MG/ML  SOLN
100.0000 mL | Freq: Once | INTRAMUSCULAR | Status: AC | PRN
  Administered 2020-11-29: 80 mL via INTRAVENOUS

## 2020-11-29 MED FILL — LYNPARZA 100 MG TAB: 100 | 30 days supply | Qty: 120 | Fill #0

## 2020-12-01 ENCOUNTER — Inpatient Hospital Stay (HOSPITAL_BASED_OUTPATIENT_CLINIC_OR_DEPARTMENT_OTHER): Payer: Medicare HMO | Admitting: Oncology

## 2020-12-01 ENCOUNTER — Other Ambulatory Visit: Payer: Self-pay

## 2020-12-01 VITALS — BP 180/74 | HR 80 | Temp 97.4°F | Resp 16 | Ht 68.0 in | Wt 219.8 lb

## 2020-12-01 DIAGNOSIS — Z79899 Other long term (current) drug therapy: Secondary | ICD-10-CM | POA: Diagnosis not present

## 2020-12-01 DIAGNOSIS — C61 Malignant neoplasm of prostate: Secondary | ICD-10-CM

## 2020-12-01 DIAGNOSIS — M7989 Other specified soft tissue disorders: Secondary | ICD-10-CM | POA: Diagnosis not present

## 2020-12-01 DIAGNOSIS — C787 Secondary malignant neoplasm of liver and intrahepatic bile duct: Secondary | ICD-10-CM | POA: Diagnosis not present

## 2020-12-01 NOTE — Progress Notes (Signed)
Per MD request, hospice referral placed. Referral, patient demographics and recent office visit notes faxed to [facility] and hospice.

## 2020-12-01 NOTE — Progress Notes (Signed)
Hematology and Oncology Follow Up Visit  [name] [name] [id number] 1932/09/23 87 y.o. 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  Prior Therapy: He was started on firmagon in October 2014.   His PSA started to rise up to 1.91 in June 2016 after a rise from 0.78 in March 2016.PSA was 0.53 in November 2015.   Xtandi 160 mg daily started in August 2016.  Therapy interrupted until March 2018 because of poor tolerance.  Xtandi 40 mg daily started in July 2018. His dose was increased to 160 mg in March 2021.  Therapy withheld in April 2021.  Current therapy:    He is receiving Lupron every 3 months under the care of Dr. Jeffie Pollock.  Lynparza 200 mg twice a day started on July 09, 2020.  Interim History: [name] [name] returns today for a follow-up visit.  Since the last visit, he has reports continuous decline in his overall health and performance status.  He still ambulating short distances with the help of a walker but has not had any quality of life for many months at this time.  He denies any pain discomfort.  He is performance status is poor at this time.  He has reported left lower leg swelling.  Medications: Unchanged on review. Current Outpatient Medications  Medication Sig Dispense Refill  . acetaminophen (TYLENOL) 325 MG tablet Take 2 tablets (650 mg total) by mouth every 6 (six) hours as needed for mild pain (or Fever >/= 101). 30 tablet 0  . albuterol (VENTOLIN HFA) 108 (90 Base) MCG/ACT inhaler Inhale 2 puffs into the lungs every 6 (six) hours as needed for wheezing or shortness of breath.    Marland Kitchen amoxicillin (AMOXIL) 500 MG capsule Take 1 capsule (500 mg total) by mouth every 8 (eight) hours. 42 capsule 0  . carvedilol (COREG) 3.125 MG tablet Take 1 tablet (3.125 mg total)  by mouth 2 (two) times daily with a meal. 60 tablet 0  . diazepam (VALIUM) 10 MG tablet Take 10 mg by mouth once as needed (one hour prior to procedure).     . fexofenadine (ALLEGRA) 180 MG tablet Take 180 mg by mouth daily.    . fluticasone (FLONASE) 50 MCG/ACT nasal spray Place 2 sprays into both nostrils daily as needed for allergies or rhinitis.    Marland Kitchen levothyroxine (SYNTHROID, LEVOTHROID) 150 MCG tablet Take 150 mcg by mouth daily before breakfast.    . LYNPARZA 100 MG tablet TAKE 2 TABLETS (200 MG TOTAL) BY MOUTH 2 (TWO) TIMES DAILY. SWALLOW WHOLE. MAY TAKE WITH FOOD TO DECREASE NAUSEA AND VOMITING. 120 tablet 0  . meloxicam (MOBIC) 15 MG tablet Take 15 mg by mouth daily.    . Multiple Vitamin (MULTIVITAMIN WITH MINERALS) TABS tablet Take 1 tablet by mouth 2 (two) times a week.     Marland Kitchen MYRBETRIQ 50 MG TB24 tablet Take 50 mg by mouth daily.   0  . ondansetron (ZOFRAN) 4 MG tablet Take 1 tablet (4 mg total) by mouth every 6 (six) hours as needed for nausea. 20 tablet 0  . oxyCODONE-acetaminophen (PERCOCET/ROXICET) 5-325 MG tablet Take 1 tablet by mouth every 6 (six) hours as needed (for pain).     . polyethylene glycol (MIRALAX / GLYCOLAX) 17 g packet Take 17 g by mouth daily as needed for mild  constipation. 14 each 0  . traZODone (DESYREL) 50 MG tablet Take 1 tablet (50 mg total) by mouth at bedtime as needed for sleep. 30 tablet 0   No current facility-administered medications for this visit.     Allergies: No Known Allergies     Physical Exam:     Blood pressure (!) 180/74, pulse 80, temperature (!) 97.4 F (36.3 C), temperature source Tympanic, resp. rate 16, height $RemoveBe'5\' 8"'BwGDFsLYc$  (1.727 m), weight 219 lb 12.8 oz (99.7 kg), SpO2 99 %.      ECOG: 2   General appearance: Alert, awake without any distress. Head: Atraumatic without abnormalities Oropharynx: Without any thrush or ulcers. Eyes: No scleral icterus. Lymph nodes: No lymphadenopathy noted in the cervical, supraclavicular,  or axillary nodes Heart:regular rate and rhythm, without any murmurs or gallops.   Lung: Clear to auscultation without any rhonchi, wheezes or dullness to percussion. Abdomin: Soft, nontender without any shifting dullness or ascites. Musculoskeletal: No clubbing or cyanosis. Neurological: No motor or sensory deficits. Skin: No rashes or lesions.        Lab Results: Lab Results  Component Value Date   WBC 5.8 09/30/2020   HGB 10.6 (L) 09/30/2020   HCT 32.8 (L) 09/30/2020   MCV 99.7 09/30/2020   PLT 151 09/30/2020     Chemistry      Component Value Date/Time   NA 135 09/30/2020 1444   NA 140 06/05/2017 1443   K 4.8 09/30/2020 1444   K 4.6 06/05/2017 1443   CL 104 09/30/2020 1444   CO2 23 09/30/2020 1444   CO2 24 06/05/2017 1443   BUN 22 09/30/2020 1444   BUN 22.7 06/05/2017 1443   CREATININE 1.50 (H) 11/29/2020 1235   CREATININE 1.27 (H) 09/30/2020 1444   CREATININE 1.2 06/05/2017 1443      Component Value Date/Time   CALCIUM 9.1 09/30/2020 1444   CALCIUM 9.4 06/05/2017 1443   ALKPHOS 86 09/30/2020 1444   ALKPHOS 66 06/05/2017 1443   AST 15 09/30/2020 1444   AST 17 06/05/2017 1443   ALT 10 09/30/2020 1444   ALT 15 06/05/2017 1443   BILITOT 0.6 09/30/2020 1444   BILITOT 0.41 06/05/2017 1443     Results for [name], [name] (MRN [id number]) as of 12/01/2020 15:24  Ref. Range 08/17/2020 14:39 09/30/2020 14:44  Prostate Specific Ag, Serum Latest Ref Range: 0.0 - 4.0 ng/mL 42.0 (H) 55.8 (H)     EXAM: CT CHEST, ABDOMEN, AND PELVIS WITH CONTRAST  TECHNIQUE: Multidetector CT imaging of the chest, abdomen and pelvis was performed following the standard protocol during bolus administration of intravenous contrast.  CONTRAST:  32mL OMNIPAQUE IOHEXOL 300 MG/ML  SOLN  COMPARISON:  March 18, 2020 and 07/18/2020  FINDINGS: CT CHEST FINDINGS  Cardiovascular: Calcified atheromatous plaque of the thoracic aorta. No aneurysmal dilation. Three-vessel coronary  artery disease. Normal heart size without pericardial effusion. Normal appearance of central pulmonary vasculature on venous phase. Aortic and mitral calcifications as before.  Mediastinum/Nodes: Esophagus is grossly normal. No thoracic inlet lymphadenopathy. No axillary lymphadenopathy. No mediastinal lymphadenopathy. No hilar lymphadenopathy.  Lungs/Pleura: Small RIGHT-sided pleural effusion has developed since the previous study. Basilar atelectasis. Airways are patent.  Musculoskeletal: See below for full musculoskeletal details.  CT ABDOMEN PELVIS FINDINGS  Hepatobiliary: 3.9 x 3.7 cm LEFT hepatic lobe lesion previously 2.0 cm greatest dimension.  RIGHT hepatic lobe lesion 1.6 cm is new on image 50 of series 2.  Posterior RIGHT hepatic lobe lesion on image 45 of  series 2 at 4.3 x 3.4 cm previously approximately 2.0 cm.  No pericholecystic stranding.  No biliary duct dilation.  Pancreas: Pancreatic tail lesion measuring 4.1 x 2.8 cm extending into the splenic hilum previously approximately 4.6 x 3.6 cm.  Spleen: Spleen normal size and contour without focal lesion.  Adrenals/Urinary Tract: Adrenal glands are normal.  RIGHT-sided hydronephrosis and ureteral dilation. RIGHT renal pelvis at 5.5 cm as compared to 4.8 cm. This terminates in the area of the RIGHT UVJ, obscured by streak artifact from hip arthroplasty. LEFT-sided ureteral dilation has developed since the previous exam with mild LEFT-sided hydronephrosis. Small low-density lesions in the bilateral kidneys with an intermediate density lesion in the interpolar LEFT kidney on image 65 of series 2 are unchanged. This intermediate density lesion measuring 12 mm.  Stomach/Bowel: No acute gastrointestinal process. Stool fills much of the colon.  Vascular/Lymphatic: Retroperitoneal adenopathy. Adenopathy and soft tissue anterior to the inferior mesenteric artery measuring 2 cm short axis is  stable.  LEFT common iliac adenopathy (image 80, series 2) 2.5 cm previously 2.3 cm.  LEFT external iliac adenopathy on image 94 of series 2 measuring 2.9 cm short axis markedly enlarged since the prior study where it had a fatty hilum and measured 1.4 cm.  LEFT external iliac lymph node on image 95 of series 2 measuring 1.9 cm short axis previously approximately 2.1 cm.  Calcified and noncalcified atheromatous plaque of the abdominal aorta.  Reproductive: Prostate with brachytherapy seeds in place.  Other: No ascites.  Musculoskeletal: Frankly destructive lesion destroying the cortex both anteriorly and posteriorly of the LEFT parasymphyseal pubic bone with similar appearance. Pathologic fracture of the LEFT superior pubic ramus shows more discrete and defined margins than on the previous exam.  RIGHT hip arthroplasty with streak artifact limiting detail in the pelvis.  Spinal degenerative changes.  Healing of posterior LEFT-sided rib fractures since the previous exam.     Impression and Plan:   87 year old man with:  1.  Advanced prostate cancer with disease to the liver diagnosed in October 2021.  He has castration-resistant disease at this time.   He is currently on [place] which she has tolerated without any major complaints.  His PSA continues to rise however since the start of the treatment.  Imaging studies obtained on November 29, 2020 were personally reviewed and discussed with the reviewing radiologist and showed clear progression of disease.  Treatment options moving forward were reviewed.  Aggressive management would require tissue biopsy of his hepatic lesion and consideration for chemotherapy with taxane or platinum based regimen if he has a poorly differentiated component.  Alternatively supportive care and hospice would be more appropriate given his age, performance status and overall debilitation.  After discussion today, he is agreeable to  proceed with hospice and will discontinue all anticancer treatment.   2.  Androgen deprivation: He is currently receiving that under the care of Dr. Jeffie Pollock.  I recommend discontinuation if he elects to proceed with hospice.  3.  Left leg swelling: Likely related to lymphatic obstruction and possible venous thrombosis.  He refused further investigation at this time given his end-stage status.  He understands there is a clot will likely propagate and cause fatal pulmonary embolism.  He is aware of this possibility and prefers not to have any more medication or treatment.  4.  Prognosis and goals of care: Poor with limited life expectancy.  I recommended proceeding with hospice care and no aggressive measures cardiopulmonary resuscitation if he is ever  hospitalized.   5.  Hydronephrosis: Related to his prostate cancer and a local relapse.  He appears to be asymptomatic at this time and do not recommend any aggressive measures unless he develops specific symptoms.  6. Follow-up:   Will be as needed at this time.  Will rely on hospice for updates.  30 minutes were dedicated to this encounter.  The time was spent on reviewing disease status, reviewing imaging studies, discussing treatment options and future plan of care review.  Zola Button, MD 3/9/20223:23 PM

## 2020-12-17 ENCOUNTER — Other Ambulatory Visit (HOSPITAL_COMMUNITY): Payer: Self-pay

## 2020-12-28 ENCOUNTER — Telehealth: Payer: Self-pay | Admitting: *Deleted

## 2020-12-28 NOTE — Telephone Encounter (Signed)
-----   Message from Wyatt Portela, MD sent at 12/28/2020  1:16 PM EDT ----- Regarding: RE: Question about pain med Percocet can be given every 4 hours.  Thanks ----- Message ----- From: Rolene Course, RN Sent: 12/28/2020   1:14 PM EDT To: Wyatt Portela, MD Subject: Question about pain med                        I received a phone call from this patient's hospice nurse.  She said he has had a lot of changes and gave the impression he isn't doing well.  She is asking if his percocet can be increased to every four hours or if he can have a new pain medication prescription for something else.  Please advise.

## 2020-12-28 NOTE — Telephone Encounter (Signed)
Phone call to [name] Gala RN with hospice regarding pain medication for this patient.  RN informed Dr. Alen Blew instructed that patient may have percocet every 4 hours as needed for pain.  RN verbalizes understanding.

## 2021-01-23 DEATH — deceased

## 2021-01-28 ENCOUNTER — Other Ambulatory Visit (HOSPITAL_COMMUNITY): Payer: Self-pay

## 2021-02-03 ENCOUNTER — Telehealth: Payer: Self-pay | Admitting: *Deleted

## 2021-02-03 NOTE — Telephone Encounter (Signed)
Faxed orders signed by Dr. Alen Blew to [facility]  [phone]. Fax confirmation received. Original sent to HIM

## 2021-04-19 ENCOUNTER — Other Ambulatory Visit (HOSPITAL_COMMUNITY): Payer: Self-pay
